# Patient Record
Sex: Female | Born: 1940 | Race: White | Hispanic: No | Smoking: Never smoker
Health system: Southern US, Community
[De-identification: ages and names within clinical notes are randomized; demographics above are authoritative.]

## PROBLEM LIST (undated history)

## (undated) DIAGNOSIS — Z972 Presence of dental prosthetic device (complete) (partial): Secondary | ICD-10-CM

## (undated) DIAGNOSIS — M1990 Unspecified osteoarthritis, unspecified site: Secondary | ICD-10-CM

## (undated) DIAGNOSIS — R232 Flushing: Secondary | ICD-10-CM

## (undated) DIAGNOSIS — I10 Essential (primary) hypertension: Secondary | ICD-10-CM

## (undated) DIAGNOSIS — G629 Polyneuropathy, unspecified: Secondary | ICD-10-CM

## (undated) DIAGNOSIS — E785 Hyperlipidemia, unspecified: Secondary | ICD-10-CM

## (undated) DIAGNOSIS — D649 Anemia, unspecified: Secondary | ICD-10-CM

## (undated) DIAGNOSIS — Z973 Presence of spectacles and contact lenses: Secondary | ICD-10-CM

## (undated) DIAGNOSIS — T4145XA Adverse effect of unspecified anesthetic, initial encounter: Secondary | ICD-10-CM

## (undated) DIAGNOSIS — Z923 Personal history of irradiation: Secondary | ICD-10-CM

## (undated) DIAGNOSIS — F329 Major depressive disorder, single episode, unspecified: Secondary | ICD-10-CM

## (undated) DIAGNOSIS — G4733 Obstructive sleep apnea (adult) (pediatric): Secondary | ICD-10-CM

## (undated) DIAGNOSIS — C50919 Malignant neoplasm of unspecified site of unspecified female breast: Secondary | ICD-10-CM

## (undated) DIAGNOSIS — T8859XA Other complications of anesthesia, initial encounter: Secondary | ICD-10-CM

## (undated) DIAGNOSIS — E119 Type 2 diabetes mellitus without complications: Secondary | ICD-10-CM

## (undated) DIAGNOSIS — F419 Anxiety disorder, unspecified: Secondary | ICD-10-CM

## (undated) DIAGNOSIS — N200 Calculus of kidney: Secondary | ICD-10-CM

## (undated) DIAGNOSIS — F409 Phobic anxiety disorder, unspecified: Secondary | ICD-10-CM

## (undated) DIAGNOSIS — K760 Fatty (change of) liver, not elsewhere classified: Secondary | ICD-10-CM

## (undated) DIAGNOSIS — M722 Plantar fascial fibromatosis: Secondary | ICD-10-CM

## (undated) DIAGNOSIS — IMO0002 Reserved for concepts with insufficient information to code with codable children: Secondary | ICD-10-CM

## (undated) DIAGNOSIS — E669 Obesity, unspecified: Secondary | ICD-10-CM

## (undated) DIAGNOSIS — F32A Depression, unspecified: Secondary | ICD-10-CM

## (undated) DIAGNOSIS — Z87442 Personal history of urinary calculi: Secondary | ICD-10-CM

## (undated) DIAGNOSIS — K219 Gastro-esophageal reflux disease without esophagitis: Secondary | ICD-10-CM

## (undated) HISTORY — DX: Type 2 diabetes mellitus without complications: E11.9

## (undated) HISTORY — DX: Essential (primary) hypertension: I10

## (undated) HISTORY — DX: Unspecified osteoarthritis, unspecified site: M19.90

## (undated) HISTORY — DX: Calculus of kidney: N20.0

## (undated) HISTORY — DX: Gastro-esophageal reflux disease without esophagitis: K21.9

## (undated) HISTORY — DX: Major depressive disorder, single episode, unspecified: F32.9

## (undated) HISTORY — PX: OOPHORECTOMY: SHX6387

## (undated) HISTORY — DX: Fatty (change of) liver, not elsewhere classified: K76.0

## (undated) HISTORY — DX: Plantar fascial fibromatosis: M72.2

## (undated) HISTORY — DX: Depression, unspecified: F32.A

## (undated) HISTORY — DX: Polyneuropathy, unspecified: G62.9

## (undated) HISTORY — DX: Anxiety disorder, unspecified: F41.9

## (undated) HISTORY — DX: Phobic anxiety disorder, unspecified: F40.9

## (undated) HISTORY — DX: Obstructive sleep apnea (adult) (pediatric): G47.33

## (undated) HISTORY — DX: Reserved for concepts with insufficient information to code with codable children: IMO0002

## (undated) HISTORY — PX: CYSTOSCOPY W/ URETERAL STENT PLACEMENT: SHX1429

## (undated) HISTORY — PX: KNEE SURGERY: SHX244

## (undated) HISTORY — PX: DILATION AND CURETTAGE OF UTERUS: SHX78

## (undated) HISTORY — DX: Obesity, unspecified: E66.9

## (undated) HISTORY — DX: Hyperlipidemia, unspecified: E78.5

## (undated) HISTORY — DX: Flushing: R23.2

---

## 1983-09-28 HISTORY — PX: BREAST LUMPECTOMY: SHX2

## 1986-09-27 HISTORY — PX: VAGINAL HYSTERECTOMY: SUR661

## 1999-10-08 ENCOUNTER — Other Ambulatory Visit: Admission: RE | Admit: 1999-10-08 | Discharge: 1999-10-08 | Payer: Self-pay | Admitting: Obstetrics & Gynecology

## 2000-11-01 ENCOUNTER — Other Ambulatory Visit: Admission: RE | Admit: 2000-11-01 | Discharge: 2000-11-01 | Payer: Self-pay | Admitting: Obstetrics & Gynecology

## 2001-12-04 ENCOUNTER — Other Ambulatory Visit: Admission: RE | Admit: 2001-12-04 | Discharge: 2001-12-04 | Payer: Self-pay | Admitting: Obstetrics & Gynecology

## 2003-01-14 ENCOUNTER — Other Ambulatory Visit: Admission: RE | Admit: 2003-01-14 | Discharge: 2003-01-14 | Payer: Self-pay | Admitting: Obstetrics & Gynecology

## 2004-01-08 ENCOUNTER — Other Ambulatory Visit: Admission: RE | Admit: 2004-01-08 | Discharge: 2004-01-08 | Payer: Self-pay | Admitting: Obstetrics & Gynecology

## 2005-01-26 ENCOUNTER — Other Ambulatory Visit: Admission: RE | Admit: 2005-01-26 | Discharge: 2005-01-26 | Payer: Self-pay | Admitting: Obstetrics & Gynecology

## 2009-05-15 ENCOUNTER — Encounter: Admission: RE | Admit: 2009-05-15 | Discharge: 2009-05-15 | Payer: Self-pay | Admitting: Family Medicine

## 2010-05-27 ENCOUNTER — Encounter: Admission: RE | Admit: 2010-05-27 | Discharge: 2010-05-27 | Payer: Self-pay | Admitting: Family Medicine

## 2010-09-23 ENCOUNTER — Ambulatory Visit (HOSPITAL_COMMUNITY): Admission: RE | Admit: 2010-09-23 | Payer: Self-pay | Source: Home / Self Care | Admitting: Orthopedic Surgery

## 2010-10-12 ENCOUNTER — Ambulatory Visit (HOSPITAL_COMMUNITY)
Admission: RE | Admit: 2010-10-12 | Discharge: 2010-10-12 | Payer: Self-pay | Source: Home / Self Care | Attending: Orthopedic Surgery | Admitting: Orthopedic Surgery

## 2010-10-18 ENCOUNTER — Encounter: Payer: Self-pay | Admitting: Orthopedic Surgery

## 2011-02-03 ENCOUNTER — Ambulatory Visit (INDEPENDENT_AMBULATORY_CARE_PROVIDER_SITE_OTHER): Payer: Medicare Other | Admitting: Internal Medicine

## 2011-02-03 ENCOUNTER — Encounter: Payer: Self-pay | Admitting: Internal Medicine

## 2011-02-03 VITALS — BP 132/76 | HR 88 | Ht 65.0 in | Wt 268.0 lb

## 2011-02-03 DIAGNOSIS — K921 Melena: Secondary | ICD-10-CM | POA: Insufficient documentation

## 2011-02-03 DIAGNOSIS — R1032 Left lower quadrant pain: Secondary | ICD-10-CM

## 2011-02-03 MED ORDER — PEG-KCL-NACL-NASULF-NA ASC-C 100 G PO SOLR: 1.0000 | Freq: Once | ORAL | Status: AC

## 2011-02-03 NOTE — Progress Notes (Signed)
Subjective:    Patient ID: Candice Reid, female    DOB: 28-Oct-1940, 70 y.o.   MRN: [id number]  HPI 70 year old widowed white woman with an approximately one-year history of intermittent hematochezia. She is passing red blood, usually bright into the commode he can also see with wiping. Her hemoglobin is normal she says. She's had some left lower quadrant pain and was prescribed some dicyclomine proximally 6 months ago and that seemed to help her. He said no other change in bowel habits or melena. She is actually currently feeling well. She had previous colonoscopy 2007 it was routine for screening she remembers it as being normal but we will request her records. She says she has a colonoscopy she does not want to feel anything and is concerned about the possibility of any type of cramps or pain. Dr. Donnetta Hail notes from 12/23/10 reviewed.  Past Medical History  Diagnosis Date  . Asthma   . Kidney stones   . Arthritis   . Hypertension   . Depression   . Hemorrhoids   . Hyperlipemia   . Anxiety                                                                                                                                                                                                                                                                  . GERD (gastroesophageal reflux disease)   . Obesity    Past Surgical History  Procedure Date  . Vaginal hysterectomy 1988  . Knee surgery     x 2  . Breast lumpectomy     reports that she has never smoked. She has never used smokeless tobacco. She reports that she does not drink alcohol or use illicit drugs. family history includes Colon polyps in her sister; Diabetes in her mother; Ovarian cancer in her sister; and Prostate cancer in her father.  There is no history of Colon cancer. Allergies  Allergen Reactions  . Ace Inhibitors   . Morphine And Related   . Sulfa Antibiotics   . Tylox        Review of Systems Also her anxiety, back  pain, depression, fatigue, night sweats, dyspnea, urinary incontinence at times. All other systems are negative or as per history of present  illness.    Objective:   Physical Exam Obese and well-developed white woman no acute distress The eyes anicteric pupils round and reactive to light The mouth and posterior pharynx are free of lesions The neck is supple no mass Lungs clear Heart distant S1-S2 no murmurs or gallops or rubs The abdomen is obese soft and nontender without obvious organomegaly, mass or hernia and bowel sounds are present Rectal exam with female staff present shows an old external tags brown stool no mass Lower extremities are free of edema Mood is appropriate Awake, alert and oriented x3       Assessment & Plan:

## 2011-02-03 NOTE — Assessment & Plan Note (Signed)
Investigated with colonoscopy in 2007 since last. Will use deep sedation with propofol, she is anxious she also has obesity which are some risk factors for poor outcome with moderate sedation. I explained there could be additional charges incurred. Risks benefits indications of colonoscopy were explained she understands and agrees to proceed.

## 2011-02-03 NOTE — Assessment & Plan Note (Signed)
Question from diverticulosis versus IBS or other problems colorectal neoplasia in the differential with bleeding and the pain await colonoscopy. She could resume using dicyclomine if needed, currently not taking that apparently.

## 2011-02-03 NOTE — Patient Instructions (Signed)
CcMolly Maduro Reade,MD   Colonoscopy A colonoscopy is an exam to evaluate your entire colon. In this exam, your colon is cleansed. A long fiberoptic tube is inserted through your rectum and into your colon. The fiberoptic scope (endoscope) is a long bundle of enclosed and very flexible fibers. These fibers transmit light to the area examined and send images from that area to your caregiver. Discomfort is usually minimal. You may be given a drug to help you sleep (sedative) during or prior to the procedure. This exam helps to detect lumps (tumors), polyps, inflammation, and areas of bleeding. Your caregiver may also take a small piece of tissue (biopsy) that will be examined under a microscope. BEFORE THE PROCEDURE  A clear liquid diet may be required for 2 days before the exam.   Liquid injections (enemas) or laxatives may be required.   A large amount of electrolyte solution may be given to you to drink over a short period of time. This solution is used to clean out your colon.   You should be present 1 prior to your procedure or as directed by your caregiver.   Check in at the admissions desk to fill out necessary forms if not preregistered. There will be consent forms to sign prior to the procedure. If accompanied by friends or family, there is a waiting area for them while you are having your procedure.  LET YOUR CAREGIVER KNOW ABOUT:  Allergies to food or medicine.  Medicines taken, including vitamins, herbs, eyedrops, over-the-counter medicines, and creams.   Use of steroids (by mouth or creams).   Previous problems with anesthetics or numbing medicines.   History of bleeding problems or blood clots.  Previous surgery.   Other health problems, including diabetes and kidney problems.   Possibility of pregnancy, if this applies.   AFTER THE PROCEDURE  If you received a sedative and/or pain medicine, you will need to arrange for someone to drive you home.   Occasionally, there is  a little blood passed with the first bowel movement. DO NOT be concerned.  HOME CARE INSTRUCTIONS  It is not unusual to pass moderate amounts of gas and experience mild abdominal cramping following the procedure. This is due to air being used to inflate your colon during the exam. Walking or a warm pack on your belly (abdomen) may help.   You may resume all normal meals and activities after sedatives and medicines have worn off.   Only take over-the-counter or prescription medicines for pain, discomfort, or fever as directed by your caregiver. DO NOT use aspirin or blood thinners if a biopsy was taken. Consult your caregiver for medicine usage if biopsies were taken.  FINDING OUT THE RESULTS OF YOUR TEST Not all test results are available during your visit. If your test results are not back during the visit, make an appointment with your caregiver to find out the results. Do not assume everything is normal if you have not heard from your caregiver or the medical facility. It is important for you to follow up on all of your test results. SEEK IMMEDIATE MEDICAL CARE IF:  You pass large blood clots or fill a toilet with blood following the procedure. This may also occur 10 to 14 days following the procedure. This is more likely if a biopsy was taken.   You develop abdominal pain that keeps getting worse and cannot be relieved with medicine.  Document Released: 09/10/2000 Document Re-Released: 12/08/2009 [facility] Patient Information 2011 [name], Candice Reid. Your Colonoscopy  with Propoful is scheduled on 03/11/2011 at 2:30pm You can pick up your MoviPrep kit from your pharmacy today

## 2011-03-11 ENCOUNTER — Ambulatory Visit (AMBULATORY_SURGERY_CENTER): Payer: Medicare Other | Admitting: Internal Medicine

## 2011-03-11 ENCOUNTER — Encounter: Payer: Self-pay | Admitting: Internal Medicine

## 2011-03-11 VITALS — BP 113/61 | HR 95 | Temp 98.6°F | Resp 20 | Ht 65.0 in | Wt 268.0 lb

## 2011-03-11 DIAGNOSIS — Z9283 Personal history of failed moderate sedation: Secondary | ICD-10-CM | POA: Insufficient documentation

## 2011-03-11 DIAGNOSIS — K648 Other hemorrhoids: Secondary | ICD-10-CM

## 2011-03-11 DIAGNOSIS — K921 Melena: Secondary | ICD-10-CM

## 2011-03-11 DIAGNOSIS — K5730 Diverticulosis of large intestine without perforation or abscess without bleeding: Secondary | ICD-10-CM

## 2011-03-11 DIAGNOSIS — K625 Hemorrhage of anus and rectum: Secondary | ICD-10-CM

## 2011-03-11 HISTORY — PX: COLONOSCOPY: SHX174

## 2011-03-11 MED ORDER — SODIUM CHLORIDE 0.9 % IV SOLN: 500.0000 mL | INTRAVENOUS | Status: DC

## 2011-03-11 NOTE — Patient Instructions (Signed)
Please follow the green and blue discharge instructions today.  See the [employer] picture page for colonoscopy results.  Call if any questions or concerns.

## 2011-03-11 NOTE — Progress Notes (Signed)
Pt c/o of cramping on admission.  I encouraged pt to pass flatus.  MAW  Pt still had not pass flatus.  Pt rolled to the right side.  Good amount of air expelled. MAW  No complaints on discharge.  MAW

## 2011-03-12 ENCOUNTER — Telehealth: Payer: Self-pay | Admitting: *Deleted

## 2011-03-12 NOTE — Telephone Encounter (Signed)

## 2012-08-30 ENCOUNTER — Encounter (INDEPENDENT_AMBULATORY_CARE_PROVIDER_SITE_OTHER): Payer: Medicare Other | Admitting: Ophthalmology

## 2012-08-30 DIAGNOSIS — H35039 Hypertensive retinopathy, unspecified eye: Secondary | ICD-10-CM

## 2012-08-30 DIAGNOSIS — I10 Essential (primary) hypertension: Secondary | ICD-10-CM

## 2012-08-30 DIAGNOSIS — H43819 Vitreous degeneration, unspecified eye: Secondary | ICD-10-CM

## 2012-08-30 DIAGNOSIS — H2510 Age-related nuclear cataract, unspecified eye: Secondary | ICD-10-CM

## 2013-01-18 ENCOUNTER — Emergency Department (HOSPITAL_BASED_OUTPATIENT_CLINIC_OR_DEPARTMENT_OTHER)
Admission: EM | Admit: 2013-01-18 | Discharge: 2013-01-18 | Disposition: A | Discharge status: Home / Self Care | Payer: Medicare Other | Attending: Emergency Medicine | Admitting: Emergency Medicine

## 2013-01-18 ENCOUNTER — Other Ambulatory Visit: Payer: Self-pay

## 2013-01-18 DIAGNOSIS — J45909 Unspecified asthma, uncomplicated: Secondary | ICD-10-CM | POA: Insufficient documentation

## 2013-01-18 DIAGNOSIS — Z8719 Personal history of other diseases of the digestive system: Secondary | ICD-10-CM | POA: Insufficient documentation

## 2013-01-18 DIAGNOSIS — F411 Generalized anxiety disorder: Secondary | ICD-10-CM | POA: Insufficient documentation

## 2013-01-18 DIAGNOSIS — I10 Essential (primary) hypertension: Secondary | ICD-10-CM | POA: Insufficient documentation

## 2013-01-18 DIAGNOSIS — Z8739 Personal history of other diseases of the musculoskeletal system and connective tissue: Secondary | ICD-10-CM | POA: Insufficient documentation

## 2013-01-18 DIAGNOSIS — E785 Hyperlipidemia, unspecified: Secondary | ICD-10-CM | POA: Insufficient documentation

## 2013-01-18 DIAGNOSIS — Z87442 Personal history of urinary calculi: Secondary | ICD-10-CM | POA: Insufficient documentation

## 2013-01-18 DIAGNOSIS — K219 Gastro-esophageal reflux disease without esophagitis: Secondary | ICD-10-CM | POA: Insufficient documentation

## 2013-01-18 DIAGNOSIS — E669 Obesity, unspecified: Secondary | ICD-10-CM | POA: Insufficient documentation

## 2013-01-18 DIAGNOSIS — Z79899 Other long term (current) drug therapy: Secondary | ICD-10-CM | POA: Insufficient documentation

## 2013-01-18 LAB — CBC WITH DIFFERENTIAL/PLATELET
Basophils Relative: 0 % (ref 0–1)
Eosinophils Absolute: 0.4 10*3/uL (ref 0.0–0.7)
HCT: 38.9 % (ref 36.0–46.0)
Hemoglobin: 13 g/dL (ref 12.0–15.0)
MCH: 30.2 pg (ref 26.0–34.0)
MCHC: 33.4 g/dL (ref 30.0–36.0)
MCV: 90.5 fL (ref 78.0–100.0)
Monocytes Absolute: 0.9 10*3/uL (ref 0.1–1.0)
Monocytes Relative: 8 % (ref 3–12)
Neutro Abs: 5.7 10*3/uL (ref 1.7–7.7)

## 2013-01-18 LAB — BASIC METABOLIC PANEL
CO2: 31 mEq/L (ref 19–32)
Chloride: 101 mEq/L (ref 96–112)
Creatinine, Ser: 0.9 mg/dL (ref 0.50–1.10)
GFR calc Af Amer: 73 mL/min — ABNORMAL LOW (ref >90)
Potassium: 4 mEq/L (ref 3.5–5.1)
Sodium: 141 mEq/L (ref 135–145)

## 2013-01-18 LAB — TROPONIN I: Troponin I: <0.3 ng/mL (ref <0.30)

## 2013-01-18 NOTE — ED Provider Notes (Signed)
History     CSN: [id number]  Arrival date & time 01/18/13  1478   First MD Initiated Contact with Patient 01/18/13 1955      Chief Complaint  Patient presents with  . Hypertension    (Consider location/radiation/quality/duration/timing/severity/associated sxs/prior treatment) HPI Comments: Patient presents with complaints of elevated blood pressure at home.  She recently had her medications changed for losartan to valsartan due to insurance reasons.  Her bp seems to be fluctuating at home.  She took it and was found to be 200/110 at home prior to coming here.  She otherwise feels okay.  Her only complaints are of feeling somewhat dizzy-headed.  No chest pain or shortness of breath.  No headaches.    Patient is a 72 y.o. female presenting with hypertension. The history is provided by the patient.  Hypertension This is a new problem. Episode onset: several days ago. Episode frequency: intermittently. The problem has been gradually worsening. Pertinent negatives include no chest pain, no abdominal pain, no headaches and no shortness of breath. Nothing aggravates the symptoms. Nothing relieves the symptoms.    Past Medical History  Diagnosis Date  . Asthma   . Kidney stones   . Arthritis   . Hypertension   . Depression   . Hemorrhoids   . Hyperlipemia   . Anxiety                                                                                                                                                                                                                                                                  . GERD (gastroesophageal reflux disease)   . Obesity     Past Surgical History  Procedure Laterality Date  . Vaginal hysterectomy  1988  . Knee surgery      x 2  . Breast lumpectomy    . Colonoscopy  03/11/2011    diverticulosis, internal hemorrhoids    Family History  Problem Relation Age of Onset  . Diabetes Mother   . Colon cancer Neg Hx   . Colon polyps  Sister   . Ovarian cancer Sister   . Prostate cancer Father     History  Substance Use Topics  . Smoking status: Never Smoker   . Smokeless tobacco:  Never Used  . Alcohol Use: No    OB History   Grav Para Term Preterm Abortions TAB SAB Ect Mult Living                  Review of Systems  Respiratory: Negative for shortness of breath.   Cardiovascular: Negative for chest pain.  Gastrointestinal: Negative for abdominal pain.  Neurological: Negative for headaches.  All other systems reviewed and are negative.    Allergies  Ace inhibitors; Morphine and related; Oxycodone-acetaminophen; and Sulfa antibiotics  Home Medications   Current Outpatient Rx  Name  Route  Sig  Dispense  Refill  . DULoxetine (CYMBALTA) 60 MG capsule   Oral   Take 60 mg by mouth daily.         . pravastatin (PRAVACHOL) 10 MG tablet   Oral   Take 10 mg by mouth daily.         . valsartan-hydrochlorothiazide (DIOVAN-HCT) 320-25 MG per tablet   Oral   Take 1 tablet by mouth daily.         [name] Kitchen ALPRAZolam (XANAX) 0.5 MG tablet   Oral   Take 0.5 mg by mouth. 1/2 as needed          . metoprolol (TOPROL-XL) 100 MG 24 hr tablet   Oral   Take 100 mg by mouth daily.           . naproxen sodium (ALEVE) 220 MG tablet   Oral   Take 220 mg by mouth. As needed          . nystatin-triamcinolone (MYCOLOG II) cream               . OMEGA-3 1000 MG CAPS   Oral   Take 1 capsule by mouth daily.           [name] Kitchen omeprazole (PRILOSEC OTC) 20 MG tablet   Oral   Take 20 mg by mouth daily.           . rosuvastatin (CRESTOR) 5 MG tablet   Oral   Take 5 mg by mouth daily.           . sertraline (ZOLOFT) 100 MG tablet   Oral   Take 150 mg by mouth daily.           [name] Kitchen telmisartan-hydrochlorothiazide (MICARDIS HCT) 80-12.5 MG per tablet   Oral   Take 1 tablet by mouth daily.           [name] Kitchen VITAMIN D, CHOLECALCIFEROL, PO   Oral   Take 1 tablet by mouth daily.             BP 143/82   Pulse 74  Temp(Src) 97.5 F (36.4 C)  Resp 14  Ht 5\' 4"  (1.626 m)  Wt 237 lb (107.502 kg)  BMI 40.66 kg/m2  SpO2 97%  Physical Exam  Nursing note and vitals reviewed. Constitutional: She is oriented to person, place, and time. She appears well-developed and well-nourished. No distress.  HENT:  Head: Normocephalic and atraumatic.  Mouth/Throat: Oropharynx is clear and moist.  Eyes: EOM are normal. Pupils are equal, round, and reactive to light.  Neck: Normal range of motion. Neck supple.  Cardiovascular: Normal rate, regular rhythm and normal heart sounds.  Exam reveals no gallop and no friction rub.   No murmur heard. Pulmonary/Chest: Effort normal and breath sounds normal. No respiratory distress. She has no wheezes.  Abdominal: Soft. Bowel sounds are normal. She exhibits no distension. There is  no tenderness.  Musculoskeletal: Normal range of motion.  Neurological: She is alert and oriented to person, place, and time. No cranial nerve deficit. She exhibits normal muscle tone. Coordination normal.  Skin: Skin is warm and dry. She is not diaphoretic.    ED Course  Procedures (including critical care time)  Labs Reviewed  CBC WITH DIFFERENTIAL  BASIC METABOLIC PANEL   No results found.   No diagnosis found.    MDM   Date: 01/19/2013  Rate: 65  Rhythm: normal sinus rhythm  QRS Axis: normal  Intervals: normal  ST/T Wave abnormalities: normal  Conduction Disutrbances:none  Narrative Interpretation:   Old EKG Reviewed: unchanged   The patient presents here with complaints of elevated blood pressure readings on her home monitor.  She recently had some changes made in her medications.  She denies any specific complaints and otherwise appears well.  She is neurologically intact and ekg and labs are all unremarkable.  She reports a bp of 200/110 at home just prior to coming here.  Our readings in the ED have been much lower,  She was monitored for two hours and bp was  checked at regular intervals and readings remained very acceptable.  I suspect her home machine readings are off.  She will be discharged to home with instructions to continue her medications and return prn if she worsens.          Geoffery Lyons, MD 01/19/13 [phone]

## 2013-01-18 NOTE — ED Notes (Signed)
MD at bedside. 

## 2013-01-18 NOTE — ED Notes (Signed)
Pt. Reports HTN for long time and was changed to a new HTN med due to trouble with HTN meds for 2 wks.   Pt. Placed on Duloxentine in place of Losartin.

## 2013-09-27 DIAGNOSIS — C50919 Malignant neoplasm of unspecified site of unspecified female breast: Secondary | ICD-10-CM

## 2013-09-27 DIAGNOSIS — Z923 Personal history of irradiation: Secondary | ICD-10-CM

## 2013-09-27 HISTORY — DX: Personal history of irradiation: Z92.3

## 2013-09-27 HISTORY — DX: Malignant neoplasm of unspecified site of unspecified female breast: C50.919

## 2014-01-29 ENCOUNTER — Other Ambulatory Visit: Payer: Self-pay | Admitting: Obstetrics & Gynecology

## 2014-01-29 DIAGNOSIS — R928 Other abnormal and inconclusive findings on diagnostic imaging of breast: Secondary | ICD-10-CM

## 2014-02-08 ENCOUNTER — Ambulatory Visit
Admission: RE | Admit: 2014-02-08 | Discharge: 2014-02-08 | Disposition: A | Discharge status: Home / Self Care | Payer: Medicare Other | Source: Ambulatory Visit | Attending: Obstetrics & Gynecology | Admitting: Obstetrics & Gynecology

## 2014-02-08 ENCOUNTER — Other Ambulatory Visit: Payer: Self-pay | Admitting: Obstetrics & Gynecology

## 2014-02-08 ENCOUNTER — Encounter (INDEPENDENT_AMBULATORY_CARE_PROVIDER_SITE_OTHER): Payer: Self-pay

## 2014-02-08 DIAGNOSIS — R928 Other abnormal and inconclusive findings on diagnostic imaging of breast: Secondary | ICD-10-CM

## 2014-02-14 ENCOUNTER — Other Ambulatory Visit: Payer: Self-pay | Admitting: Obstetrics & Gynecology

## 2014-02-14 ENCOUNTER — Ambulatory Visit
Admission: RE | Admit: 2014-02-14 | Discharge: 2014-02-14 | Disposition: A | Discharge status: Home / Self Care | Payer: Medicare Other | Source: Ambulatory Visit | Attending: Obstetrics & Gynecology | Admitting: Obstetrics & Gynecology

## 2014-02-14 DIAGNOSIS — R928 Other abnormal and inconclusive findings on diagnostic imaging of breast: Secondary | ICD-10-CM

## 2014-02-15 ENCOUNTER — Other Ambulatory Visit: Payer: Self-pay | Admitting: Obstetrics & Gynecology

## 2014-02-15 ENCOUNTER — Telehealth: Payer: Self-pay | Admitting: *Deleted

## 2014-02-15 DIAGNOSIS — Z170 Estrogen receptor positive status [ER+]: Secondary | ICD-10-CM

## 2014-02-15 DIAGNOSIS — C50919 Malignant neoplasm of unspecified site of unspecified female breast: Secondary | ICD-10-CM

## 2014-02-15 DIAGNOSIS — C50312 Malignant neoplasm of lower-inner quadrant of left female breast: Secondary | ICD-10-CM

## 2014-02-15 NOTE — Telephone Encounter (Signed)
Confirmed [facility] for 02/20/14 at 0800.  Instructions and contact information given.

## 2014-02-20 ENCOUNTER — Ambulatory Visit (HOSPITAL_BASED_OUTPATIENT_CLINIC_OR_DEPARTMENT_OTHER): Payer: Medicare Other | Admitting: Oncology

## 2014-02-20 ENCOUNTER — Ambulatory Visit
Admission: RE | Admit: 2014-02-20 | Discharge: 2014-02-20 | Disposition: A | Discharge status: Home / Self Care | Payer: Medicare Other | Source: Ambulatory Visit | Attending: Radiation Oncology | Admitting: Radiation Oncology

## 2014-02-20 ENCOUNTER — Ambulatory Visit (HOSPITAL_BASED_OUTPATIENT_CLINIC_OR_DEPARTMENT_OTHER): Payer: Medicare Other

## 2014-02-20 ENCOUNTER — Encounter: Payer: Self-pay | Admitting: Oncology

## 2014-02-20 ENCOUNTER — Encounter: Payer: Self-pay | Admitting: *Deleted

## 2014-02-20 ENCOUNTER — Ambulatory Visit (HOSPITAL_BASED_OUTPATIENT_CLINIC_OR_DEPARTMENT_OTHER): Payer: Medicare Other | Admitting: Surgery

## 2014-02-20 ENCOUNTER — Other Ambulatory Visit (INDEPENDENT_AMBULATORY_CARE_PROVIDER_SITE_OTHER): Payer: Self-pay | Admitting: Surgery

## 2014-02-20 ENCOUNTER — Other Ambulatory Visit (HOSPITAL_BASED_OUTPATIENT_CLINIC_OR_DEPARTMENT_OTHER): Payer: Medicare Other

## 2014-02-20 VITALS — BP 137/83 | HR 84 | Temp 98.5°F | Resp 20 | Ht 63.5 in | Wt 259.9 lb

## 2014-02-20 DIAGNOSIS — C50319 Malignant neoplasm of lower-inner quadrant of unspecified female breast: Secondary | ICD-10-CM

## 2014-02-20 DIAGNOSIS — C50312 Malignant neoplasm of lower-inner quadrant of left female breast: Secondary | ICD-10-CM

## 2014-02-20 DIAGNOSIS — C50212 Malignant neoplasm of upper-inner quadrant of left female breast: Secondary | ICD-10-CM

## 2014-02-20 LAB — CBC WITH DIFFERENTIAL/PLATELET
BASO%: 0.5 % (ref 0.0–2.0)
Basophils Absolute: 0 10*3/uL (ref 0.0–0.1)
EOS%: 2.4 % (ref 0.0–7.0)
Eosinophils Absolute: 0.2 10*3/uL (ref 0.0–0.5)
HCT: 39.6 % (ref 34.8–46.6)
HGB: 13.2 g/dL (ref 11.6–15.9)
LYMPH%: 36.5 % (ref 14.0–49.7)
MCH: 30.2 pg (ref 25.1–34.0)
MCHC: 33.3 g/dL (ref 31.5–36.0)
MCV: 90.4 fL (ref 79.5–101.0)
MONO#: 0.8 10*3/uL (ref 0.1–0.9)
MONO%: 8.1 % (ref 0.0–14.0)
NEUT#: 4.9 10*3/uL (ref 1.5–6.5)
NEUT%: 52.5 % (ref 38.4–76.8)
PLATELETS: 276 10*3/uL (ref 145–400)
RBC: 4.38 10*6/uL (ref 3.70–5.45)
RDW: 13.3 % (ref 11.2–14.5)
WBC: 9.4 10*3/uL (ref 3.9–10.3)
lymph#: 3.4 10*3/uL — ABNORMAL HIGH (ref 0.9–3.3)

## 2014-02-20 LAB — COMPREHENSIVE METABOLIC PANEL (CC13)
ALK PHOS: 75 U/L (ref 40–150)
ALT: 33 U/L (ref 0–55)
AST: 28 U/L (ref 5–34)
Albumin: 3.5 g/dL (ref 3.5–5.0)
Anion Gap: 12 mEq/L — ABNORMAL HIGH (ref 3–11)
BILIRUBIN TOTAL: 0.39 mg/dL (ref 0.20–1.20)
BUN: 21.4 mg/dL (ref 7.0–26.0)
CO2: 28 mEq/L (ref 22–29)
Calcium: 9.5 mg/dL (ref 8.4–10.4)
Chloride: 104 mEq/L (ref 98–109)
Creatinine: 0.9 mg/dL (ref 0.6–1.1)
GLUCOSE: 105 mg/dL (ref 70–140)
Potassium: 3.9 mEq/L (ref 3.5–5.1)
SODIUM: 144 meq/L (ref 136–145)
TOTAL PROTEIN: 7.2 g/dL (ref 6.4–8.3)

## 2014-02-20 NOTE — Progress Notes (Signed)
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   HISTORY OF PRESENT ILLNESS::Candice Reid is a 73 y.o. female  given a screening mammogram. A 1 cm mass was noted in the left breast. Ultrasound confirmed a 0.8 x 1.1 x 1.1 cm mass. She had a biopsy which showed grade 2 invasive ductal carcinoma with associated DCIS. This was ER/PR positive HER-2 is pending. Ki-67 was 10%. She presents to multidisciplinary clinic today. She has some soreness after biopsy. She had no palpable areas of concern or pain prior to her biopsy. No history of breast cancer. She presents with her daughter today. She had menses at 13. She used hormone replacement for about 5 years but quit 20 years ago. She cannot remember when she went through menopause but feels like it was 20 or 30 years ago. She is GX P2 with her first live birth 15.  PREVIOUS RADIATION THERAPY: No  PAST MEDICAL HISTORY:  has a past medical history of Asthma; Kidney stones; Arthritis; Hypertension; Depression; Hemorrhoids; Hyperlipemia; Anxiety (                                                                                                                                                                                                                                                               ); GERD (gastroesophageal reflux disease); Obesity; Hot flashes; and Phobia.  PAST SURGICAL HISTORY: Past Surgical History  Procedure Laterality Date  . Vaginal  hysterectomy  1988  . Knee surgery      x 2  . Breast lumpectomy    . Colonoscopy  03/11/2011    diverticulosis, internal hemorrhoids    FAMILY HISTORY:  Family History  Problem Relation Age of Onset  . Diabetes Mother   . Colon cancer  Neg Hx   . Colon polyps Sister   . Lung cancer Sister   . Ovarian cancer Sister   . Brain cancer Sister   . Prostate cancer Father   . Brain cancer Brother     SOCIAL HISTORY:  History  Substance Use Topics  . Smoking status: Never Smoker   . Smokeless tobacco: Never Used  . Alcohol Use: No    ALLERGIES: Ace inhibitors; Morphine and related; Oxycodone-acetaminophen; and Sulfa antibiotics  MEDICATIONS:  Current Outpatient Prescriptions  Medication Sig Dispense Refill  . ALPRAZolam (XANAX) 0.5 MG tablet Take 0.5 mg by mouth. 1/2 as needed       . DULoxetine (CYMBALTA) 60 MG capsule Take 60 mg by mouth daily.      . metoprolol (TOPROL-XL) 100 MG 24 hr tablet Take 100 mg by mouth daily.        . naproxen sodium (ALEVE) 220 MG tablet Take 220 mg by mouth. As needed       . nystatin-triamcinolone (MYCOLOG II) cream       . OMEGA-3 1000 MG CAPS Take 1 capsule by mouth daily.        [name] Kitchen omeprazole (PRILOSEC OTC) 20 MG tablet Take 20 mg by mouth daily.        . pravastatin (PRAVACHOL) 10 MG tablet Take 10 mg by mouth daily.      . rosuvastatin (CRESTOR) 5 MG tablet Take 5 mg by mouth daily.        . sertraline (ZOLOFT) 100 MG tablet Take 150 mg by mouth daily.        [name] Kitchen telmisartan-hydrochlorothiazide (MICARDIS HCT) 80-12.5 MG per tablet Take 1 tablet by mouth daily.        . valsartan-hydrochlorothiazide (DIOVAN-HCT) 320-25 MG per tablet Take 1 tablet by mouth daily.      [name] Kitchen VITAMIN D, CHOLECALCIFEROL, PO Take 1 tablet by mouth daily.         Current Facility-Administered Medications  Medication Dose Route Frequency Provider Last Rate Last Dose  . 0.9 %  sodium chloride infusion  500 mL Intravenous Continuous Gatha Mayer, MD        REVIEW OF  SYSTEMS:  A 15 point review of systems is documented in the electronic medical record. This was obtained by the nursing staff. However, I reviewed this with the patient to discuss relevant findings and make appropriate changes.  Pertinent items are noted in HPI.  PHYSICAL EXAM: There were no vitals filed for this visit.. . Pleasant female who appears younger than her stated age sitting comfortably in the examining room table. She has large pendulous breasts bilaterally. No palpable axillary cervical or subclavicular lymph nodes. No palpable abnormalities of the right breast. She has some bruising in the lower inner quadrant of the left breast associated with a palpable area postbiopsy change. She is alert and oriented x3. A 5 out of 5 strength in her bilateral upper extremities.  LABORATORY DATA:  Lab Results  Component Value Date   WBC 9.4 02/20/2014   HGB 13.2 02/20/2014   HCT 39.6 02/20/2014   MCV 90.4 02/20/2014   PLT 276 02/20/2014   Lab Results  Component Value Date   NA 144 02/20/2014   K 3.9 02/20/2014   CL 101 01/18/2013   CO2 28 02/20/2014   Lab Results  Component Value Date   ALT 33 02/20/2014   AST 28 02/20/2014   ALKPHOS 75 02/20/2014   BILITOT 0.39 02/20/2014     RADIOGRAPHY: Mm  Digital Diagnostic Unilat L  02/15/2014   ADDENDUM REPORT: 02/15/2014 14:23  ADDENDUM: Pathology results: Results from the left breast biopsy at the 7 o'clock position revealed grade 2 IDC & DCIS. This is concordant with the imaging findings. The patient has been notified of the results. She is doing well and denies any biopsy site complications.  She has a followup appointment with Multidisciplinary Clinic 02/20/2014 at 8 a.m. and a breast MRI is scheduled 02/22/2014 at 9:30 a.m. The patient has been instructed to call the [facility] with any questions or concerns.   Electronically Signed   By: Everlean Alstrom M.D.   On: 02/15/2014 14:23   02/15/2014   CLINICAL DATA:  Status post ultrasound-guided core  needle biopsy left breast mass 7 o'clock position  EXAM: DIAGNOSTIC LEFT MAMMOGRAM POST ULTRASOUND BIOPSY  COMPARISON:  Previous exams  FINDINGS: Mammographic images were obtained following ultrasound guided biopsy of suspicious mass left breast 7 o'clock position. Ribbon shaped biopsy marking clip in appropriate position.  IMPRESSION: Appropriate position biopsy marking clip status post ultrasound-guided core needle biopsy suspicious left breast mass 7 o'clock position.  Final Assessment: Post Procedure Mammograms for Marker Placement  Electronically Signed: By: Lovey Newcomer M.D. On: 02/14/2014 09:37   Mm Digital Diagnostic Unilat L  02/08/2014   CLINICAL DATA:  The patient presents for additional views of the left breast as followup to a recent screening exam to evaluate a possible mass.  EXAM: DIGITAL DIAGNOSTIC  left MAMMOGRAM  ULTRASOUND left BREAST  COMPARISON:  Previous exams.  ACR Breast Density Category b: There are scattered areas of fibroglandular density.  FINDINGS: Spot compression and true lateral images demonstrate a 1.1 cm ovoid mass with spiculated borders over the inner lower breast.  Ultrasound is performed, showing a mass of irregular shape and borders at the 7 o'clock position of the left breast 7 cm from the nipple corresponding to the mammographic abnormality. This mass measures 0.8 x 1 x 1 cm. Ultrasound of the left axilla is unremarkable.  IMPRESSION: Hypoechoic mass of irregular shape and borders at the 7 o'clock position of the left breast 7 cm from the nipple measuring 0.8 x 1 x 1 cm.  RECOMMENDATION: Recommend ultrasound-guided core needle biopsy of this suspicious mass.  I have discussed the findings and recommendations with the patient. Results were also provided in writing at the conclusion of the visit. If applicable, a reminder letter will be sent to the patient regarding the next appointment.  BI-RADS CATEGORY  4: Suspicious.  Biopsy scheduled for 02/14/2014 at 8 a.m.    Electronically Signed   By: Marin Olp M.D.   On: 02/08/2014 13:37   US Breast Ltd Uni Left Inc Axilla  02/08/2014   CLINICAL DATA:  The patient presents for additional views of the left breast as followup to a recent screening exam to evaluate a possible mass.  EXAM: DIGITAL DIAGNOSTIC  left MAMMOGRAM  ULTRASOUND left BREAST  COMPARISON:  Previous exams.  ACR Breast Density Category b: There are scattered areas of fibroglandular density.  FINDINGS: Spot compression and true lateral images demonstrate a 1.1 cm ovoid mass with spiculated borders over the inner lower breast.  Ultrasound is performed, showing a mass of irregular shape and borders at the 7 o'clock position of the left breast 7 cm from the nipple corresponding to the mammographic abnormality. This mass measures 0.8 x 1 x 1 cm. Ultrasound of the left axilla is unremarkable.  IMPRESSION: Hypoechoic mass of irregular shape and  borders at the 7 o'clock position of the left breast 7 cm from the nipple measuring 0.8 x 1 x 1 cm.  RECOMMENDATION: Recommend ultrasound-guided core needle biopsy of this suspicious mass.  I have discussed the findings and recommendations with the patient. Results were also provided in writing at the conclusion of the visit. If applicable, a reminder letter will be sent to the patient regarding the next appointment.  BI-RADS CATEGORY  4: Suspicious.  Biopsy scheduled for 02/14/2014 at 8 a.m.   Electronically Signed   By: Marin Olp M.D.   On: 02/08/2014 13:37   [place] Lt Breast Bx W Loc Dev 1st Lesion Img Bx Spec US Guide  02/14/2014   CLINICAL DATA:  Patient with suspicious left breast mass.  EXAM: ULTRASOUND GUIDED LEFT BREAST CORE NEEDLE BIOPSY  COMPARISON:  Previous exams.  PROCEDURE: I met with the patient and we discussed the procedure of ultrasound-guided biopsy, including benefits and alternatives. We discussed the high likelihood of a successful procedure. We discussed the risks of the procedure including infection,  bleeding, tissue injury, clip migration, and inadequate sampling. Informed written consent was given. The usual time-out protocol was performed immediately prior to the procedure.  Using sterile technique and 2% Lidocaine as local anesthetic, under direct ultrasound visualization, a 12 gauge vacuum-assisteddevice was used to perform biopsy of a suspicious left breast mass 7 o'clock position using a medial approach. At the conclusion of the procedure, a ribbon shaped tissue marker clip was deployed into the biopsy cavity. Follow-up 2-view mammogram was performed and dictated separately.  IMPRESSION: Ultrasound-guided biopsy of suspicious left breast mass 7 o'clock position. No apparent complications.   Electronically Signed   By: Lovey Newcomer M.D.   On: 02/14/2014 09:35      IMPRESSION: T1 B. N0 invasive ductal carcinoma of the left breast  PLAN: I spoke to the patient today regarding her diagnosis and options for treatment. We discussed the equivalence in terms of survival and local failure between mastectomy and breast conservation. We discussed the role of radiation in decreasing local failures in patients who undergo lumpectomy. We discussed that she had the option of not having radiation and having antiestrogen therapy. We discussed the differences between systemic and local treatment. We discussed the process of simulation and the placement tattoos. We discussed 4-6 weeks of treatment as an outpatient. We discussed the possibility of asymptomatic lung damage. We discussed the low likelihood of secondary malignancies. We discussed the possible side effects including but not limited to skin redness, fatigue, permanent skin darkening, and breast swelling. We discussed the process of simulation and the placement of tattoos. She met with medical oncology as well as a member of our patient family support team and our physical therapist. At this point she is leaning towards antiestrogen therapy alone after  surgery. I will plan on seeing her back after her surgery if she is interested in radiation. I spent 40 minutes face to face with the patient and more than 50% of that time was spent in counseling and/or coordination of care.   I spent 60 minutes  face to face with the patient and more than 50% of that time was spent in counseling and/or coordination of care.   ------------------------------------------------  Thea Silversmith, MD

## 2014-02-20 NOTE — Progress Notes (Signed)
 Re:   Candice Reid DOB:   03/05/1941 MRN:   [id number]  Breast [facility]  ASSESSMENT AND PLAN: 1.  Left breast cancer (T1, N0) - 7 o'clock  Path from 02/14/2014 ([id number])- Grade 2 IDC, ER/PR pos, Her2Neu - neg, Ki67 - 10%  Oncology - Candice Reid/Candice Reid  I discussed the options for breast cancer treatment with the patient.  She is in the multidisciplinary breast clinic which includes medical oncology and radiation oncology.  I discussed the surgical options of lumpectomy vs. mastectomy.  If mastectomy, there is the possibility of reconstruction.   I discussed the options of lymph node biopsy.  The treatment plan depends on the pathologic staging of the tumor and the patient's personal wishes.  The risks of surgery include, but are not limited to, bleeding, infection, the need for further surgery, and nerve injury.  The patient has been given literature on the treatment of breast cancer.  Plan:  1) The patient is very claustrophobic, so we will cancel the MRI, 2) Needle loc (or seed) left lumpectomy and left axillary SLNBx (I discussed both seed and wire loc), 3) no oncotype.  [the patient mentioned breast reduction - look into that as timed with lumpectomy]  2.  Asthma - Seasonal 3.  History of nephrolithiasis - over 15 years ago 4.  HTN x 20 years 5.  Depression/anxiety 6.  Difficulty with legs and walking 7.  Morbid obesity  No chief complaint on file.  REFERRING PHYSICIAN:  Drew Davis, MD.  Radiology  HISTORY OF PRESENT ILLNESS: Candice Reid is a 73 y.o. (DOB: 01/27/1941)  white  female whose primary care physician is Candice ALEXANDER, MD and comes to [facility] today for new left breast cancer. Her daughter, Candice Reid, is with her.  She went for her routine mammogram and was found to have an abnormality in the left breast.  She has had no prior breast biopsy.  She took hormones for 20 years, but quit about 5 years ago.  She gives a history of taking a medicine to "dry up her  lactation" after her first child.  She later heard that that medicine may be associated with breast cancer.  She does not know the name of the med.  She has no fam hx of breast cancer.  Mammograms/US on 02/08/2014 showed a 1.0 x 1.0 x 0.8 cm mass at 7 o'clock. US guided biopsy ([facility]) on 02/14/2014 Path from 02/14/2014 ([id number])- Grade 2 IDC, ER/PR pos, Her2Neu - neg, Ki67 - 10%   Past Medical History  Diagnosis Date  . Asthma   . Kidney stones   . Arthritis   . Hypertension   . Depression   . Hemorrhoids   . Hyperlipemia   . Anxiety                                                                                                                                                                                                                                                                  .   GERD (gastroesophageal reflux disease)   . Obesity      Past Surgical History  Procedure Laterality Date  . Vaginal hysterectomy  1988  . Knee surgery      x 2  . Breast lumpectomy    . Colonoscopy  03/11/2011    diverticulosis, internal hemorrhoids     Current Outpatient Prescriptions  Medication Sig Dispense Refill  . ALPRAZolam (XANAX) 0.5 MG tablet Take 0.5 mg by mouth. 1/2 as needed       . DULoxetine (CYMBALTA) 60 MG capsule Take 60 mg by mouth daily.      . metoprolol (TOPROL-XL) 100 MG 24 hr tablet Take 100 mg by mouth daily.        . naproxen sodium (ALEVE) 220 MG tablet Take 220 mg by mouth. As needed       . nystatin-triamcinolone (MYCOLOG II) cream       . OMEGA-3 1000 MG CAPS Take 1 capsule by mouth daily.        . omeprazole (PRILOSEC OTC) 20 MG tablet Take 20 mg by mouth daily.        . pravastatin (PRAVACHOL) 10 MG tablet Take 10 mg by mouth daily.      . rosuvastatin (CRESTOR) 5 MG tablet Take 5 mg by mouth daily.        . sertraline (ZOLOFT) 100 MG tablet Take 150 mg by mouth daily.        . telmisartan-hydrochlorothiazide (MICARDIS HCT) 80-12.5 MG per tablet  Take 1 tablet by mouth daily.        . valsartan-hydrochlorothiazide (DIOVAN-HCT) 320-25 MG per tablet Take 1 tablet by mouth daily.      . VITAMIN D, CHOLECALCIFEROL, PO Take 1 tablet by mouth daily.         Current Facility-Administered Medications  Medication Dose Route Frequency Provider Last Rate Last Dose  . 0.9 %  sodium chloride infusion  500 mL Intravenous Continuous Candice E Gessner, MD          Allergies  Allergen Reactions  . Ace Inhibitors   . Morphine And Related   . Oxycodone-Acetaminophen   . Sulfa Antibiotics     REVIEW OF SYSTEMS: Skin:  No history of rash.  No history of abnormal moles. Infection:  No history of hepatitis or HIV.  No history of MRSA. Neurologic:  No history of stroke.  No history of seizure.  No history of headaches. Cardiac:  Hypertension. No history of heart disease.  No history of prior cardiac catheterization.  No history of seeing a cardiologist. Pulmonary:  Seasonal asthma.  Endocrine:  No diabetes. No thyroid disease. Gastrointestinal:  No history of stomach disease.  No history of liver disease.  No history of gall bladder disease.  No history of pancreas disease.  No history of colon disease. Urologic: History of nephrolithiasis GYN:  Sees Candice Reid.  Had hysterectomy about 1990. Musculoskeletal:  Left knee arthroscopy.  Can't straighten knees and uses cane to walk any distance.. Hematologic:  No bleeding disorder.  No history of anemia.  Not anticoagulated. Psycho-social:  The patient is oriented.   The patient has no obvious psychologic or social impairment to understanding our conversation and plan.  SOCIAL and FAMILY HISTORY: Widowed. Lives by self. Has daughter, Candice Reid, and son, Candice Reid.  PHYSICAL EXAM: There were no vitals taken for this visit.  General: WN obese WF who is alert..  HEENT: Normal. Pupils equal. Neck: Supple. No mass.  No thyroid mass. Lymph Nodes:    No supraclavicular or cervical nodes. Lungs: Clear to  auscultation and symmetric breath sounds. Heart:  RRR. No murmur or rub. Breast:  Right - Very large.  No mass.  Left - Very large.  Bruise at 7 o'clock.  Abdomen: Soft. No mass. No tenderness. No hernia. Normal bowel sounds.  Pfannenstiel scar. Rectal: Not done. Extremities:  Can't straighten lower extremities and some trouble walking. Neurologic:  Grossly intact to motor and sensory function. Psychiatric: Has normal mood and affect. Behavior is normal.   DATA REVIEWED: Epic notes.  Matas Burrows, MD,  FACS [facility] [address]  [address]   , [place]    [id number] Phone:  [phone] FAX:  [phone]  

## 2014-02-20 NOTE — Progress Notes (Signed)
[place]  [phone] Fax:([phone]) [phone]     ID: [name] OB: [birth year]  MR#: [id number]  CVE#:[id number]  PCP: Vena Austria, MD GYN:  Evette Cristal SU:  Alphonsa Overall OTHER MD: Thea Silversmith  CHIEF COMPLAINT: Early-stage breast cancer  BREAST CANCER HISTORY: That he had routine screening mammography may 2015 showing a possible mass in the left breast. On 02/08/2014 she had left diagnostic mammography and ultrasonography of the breast Center, showing breast density category B. Otherwise a 1.1 cm ovoid mass in the inner lower quadrant of the left breast. Ultrasound confirmed an irregularly shaped mass measuring 1.0 cm. Ultrasound of the left axilla was unremarkable.  Biopsy of the left breast mass in question 02/14/2014 showed ([id number]) and invasive ductal carcinoma, grade 2, estrogen and progesterone receptor positive, with an MIB-1 of 10%. HER-2 is pending.  The patient's subsequent history is as detailed below  INTERVAL HISTORY: Latoy was evaluated in the multidisciplinary breast cancer clinic 02/20/2014 accompanied by her daughter Angela Nevin. The patient's case was also discussed at conference that same morning  REVIEW OF SYSTEMS: There were no specific symptoms leading to the original mammogram, which was routinely scheduled. The patient denies unusual headaches, visual changes, nausea, vomiting, stiff neck, dizziness, or gait imbalance. There has been no cough, phlegm production, or pleurisy, no chest pain or pressure, and no change in bowel or bladder habits. The patient denies fever, rash, bleeding, unexplained fatigue or unexplained weight loss. The patient [facility] as her main form of exercise. She has a long-term history of palpitations which she attributes to caffeine. She gets short of breath when walking up stairs. She has occasional problems with heartburn. She has pains here in there, especially in her  back, but these are not more intense or persistent than usual. She admits to some anxiety and depression and she has significant claustrophobia, which makes her turgor 5 of having to have an MRI. She does admit to hot flashes which are moderate. A detailed review of systems was otherwise entirely negative.  PAST MEDICAL HISTORY: Past Medical History  Diagnosis Date  . Asthma   . Kidney stones   . Arthritis   . Hypertension   . Depression   . Hemorrhoids   . Hyperlipemia   . Anxiety                                                                                                                                                                                                                                                                  .  GERD (gastroesophageal reflux disease)   . Obesity   . Hot flashes   . Phobia     PAST SURGICAL HISTORY: Past Surgical History  Procedure Laterality Date  . Vaginal hysterectomy  1988  . Knee surgery      x 2  . Breast lumpectomy    . Colonoscopy  03/11/2011    diverticulosis, internal hemorrhoids    FAMILY HISTORY Family History  Problem Relation Age of Onset  . Diabetes Mother   . Colon cancer Neg Hx   . Colon polyps Sister   . Lung cancer Sister   . Ovarian cancer Sister   . Brain cancer Sister   . Prostate cancer Father   . Brain cancer Brother   The patient's father died from cancer, unknown type, at the age of 70. The patient's mother died from a stroke in the setting of diabetes at the age of 28. The patient had one brother, 3 sisters. One sister had lung cancer diagnosed at age 22. Another had brain cancer the age of 109. One brother also had brain cancer, at the age of 32. There is no history of breast or ovarian cancer in the family to the patient's knowledge  GYNECOLOGIC HISTORY:  Menarche age 66, first live birth age 67, the patient is [place] P2. She is status post remote hysterectomy with bilateral salpingo-oophorectomy. She took  hormone replacement approximately 20 years, stopping approximately 2010.   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 Her daughter [name] lives in [place] and works in Press photographer. The patient's son [name] "[name] " rate Gorr also lives in [place], where he is a Airline pilot. The patient has 3 grandchildren. She [employer]    ADVANCED DIRECTIVES: Not in place. These were discussed at the initial visit 02/20/2014. The patient tells me she has the appropriate documents in hand and intends to name her daughter [name] as healthcare power of attorney. [name] can be reached at [place]: History  Substance Use Topics  . Smoking status: Never Smoker   . Smokeless tobacco: Never Used  . Alcohol Use: No     Colonoscopy: 2013/ [facility]  PAP: Status post hysterectomy  Bone density:10/11/2013 at Dr. Noland Fordyce, normal (lowest T score of -0.2   Lipid panel:  Allergies  Allergen Reactions  . Ace Inhibitors   . Morphine And Related   . Oxycodone-Acetaminophen   . Sulfa Antibiotics     Current Outpatient Prescriptions  Medication Sig Dispense Refill  . ALPRAZolam (XANAX) 0.5 MG tablet Take 0.5 mg by mouth. 1/2 as needed       . DULoxetine (CYMBALTA) 60 MG capsule Take 60 mg by mouth daily.      . metoprolol (TOPROL-XL) 100 MG 24 hr tablet Take 100 mg by mouth daily.        . naproxen sodium (ALEVE) 220 MG tablet Take 220 mg by mouth. As needed       . nystatin-triamcinolone (MYCOLOG II) cream       . OMEGA-3 1000 MG CAPS Take 1 capsule by mouth daily.        [name] Kitchen omeprazole (PRILOSEC OTC) 20 MG tablet Take 20 mg by mouth daily.        . pravastatin (PRAVACHOL) 10 MG tablet Take 10 mg by mouth daily.      . rosuvastatin (CRESTOR) 5 MG tablet  Take 5 mg by mouth daily.        . sertraline (ZOLOFT) 100 MG tablet Take 150 mg by mouth daily.        [name] Kitchen  telmisartan-hydrochlorothiazide (MICARDIS HCT) 80-12.5 MG per tablet Take 1 tablet by mouth daily.        . valsartan-hydrochlorothiazide (DIOVAN-HCT) 320-25 MG per tablet Take 1 tablet by mouth daily.      [name] Kitchen VITAMIN D, CHOLECALCIFEROL, PO Take 1 tablet by mouth daily.         Current Facility-Administered Medications  Medication Dose Route Frequency Provider Last Rate Last Dose  . 0.9 %  sodium chloride infusion  500 mL Intravenous Continuous Gatha Mayer, MD        OBJECTIVE: Older white woman who appears stated age  1 Vitals:   02/20/14 0900  BP: 137/83  Pulse: 84  Temp: 98.5 F (36.9 C)  Resp: 20     Body mass index is 45.31 kg/(m^2).    ECOG FS:1 - Symptomatic but completely ambulatory  Ocular: Sclerae unicteric, pupils equal, round and reactive to light Ear-nose-throat: Oropharynx clear,no lesions noted  Lymphatic: No cervical or supraclavicular adenopathy Lungs no rales or rhonchi,  there excursion bilaterally Heart regular rate and rhythm, no murmur appreciated Abd soft,  obese, nontender, positive bowel sounds MSK no focal spinal tenderness her  Neuro: non-focal, well-oriented, appropriate affect Breasts: The right breast is unremarkable. The left breast systems was recent biopsy. There is a minimal ecchymosis. I do not palpate a mass. There is no skin or nipple changes of concern. The left axilla is benign.   LAB RESULTS:  CMP     Component Value Date/Time   NA 144 02/20/2014 0843   NA 141 01/18/2013 2016   K 3.9 02/20/2014 0843   K 4.0 01/18/2013 2016   CL 101 01/18/2013 2016   CO2 28 02/20/2014 0843   CO2 31 01/18/2013 2016   GLUCOSE 105 02/20/2014 0843   GLUCOSE 103* 01/18/2013 2016   BUN 21.4 02/20/2014 0843   BUN 20 01/18/2013 2016   CREATININE 0.9 02/20/2014 0843   CREATININE 0.90 01/18/2013 2016   CALCIUM 9.5 02/20/2014 0843   CALCIUM 9.8 01/18/2013 2016   PROT 7.2 02/20/2014 0843   ALBUMIN 3.5 02/20/2014 0843   AST 28 02/20/2014 0843   ALT 33 02/20/2014 0843     ALKPHOS 75 02/20/2014 0843   BILITOT 0.39 02/20/2014 0843   GFRNONAA 63* 01/18/2013 2016   GFRAA 73* 01/18/2013 2016    I No results found for this basename: SPEP, UPEP,  kappa and lambda light chains    Lab Results  Component Value Date   WBC 9.4 02/20/2014   NEUTROABS 4.9 02/20/2014   HGB 13.2 02/20/2014   HCT 39.6 02/20/2014   MCV 90.4 02/20/2014   PLT 276 02/20/2014      Chemistry      Component Value Date/Time   NA 144 02/20/2014 0843   NA 141 01/18/2013 2016   K 3.9 02/20/2014 0843   K 4.0 01/18/2013 2016   CL 101 01/18/2013 2016   CO2 28 02/20/2014 0843   CO2 31 01/18/2013 2016   BUN 21.4 02/20/2014 0843   BUN 20 01/18/2013 2016   CREATININE 0.9 02/20/2014 0843   CREATININE 0.90 01/18/2013 2016      Component Value Date/Time   CALCIUM 9.5 02/20/2014 0843   CALCIUM 9.8 01/18/2013 2016   ALKPHOS 75 02/20/2014 0843   AST 28 02/20/2014 0843  ALT 33 02/20/2014 0843   BILITOT 0.39 02/20/2014 0843       No results found for this basename: LABCA2    No components found with this basename: LABCA125    No results found for this basename: INR,  in the last 168 hours  Urinalysis No results found for this basename: colorurine, appearanceur, labspec, phurine, glucoseu, hgbur, bilirubinur, ketonesur, proteinur, urobilinogen, nitrite, leukocytesur    STUDIES: Mm Digital Diagnostic Unilat L  02/15/2014   ADDENDUM REPORT: 02/15/2014 14:23  ADDENDUM: Pathology results: Results from the left breast biopsy at the 7 o'clock position revealed grade 2 IDC & DCIS. This is concordant with the imaging findings. The patient has been notified of the results. She is doing well and denies any biopsy site complications.  She has a followup appointment with Multidisciplinary Clinic 02/20/2014 at 8 a.m. and a breast MRI is scheduled 02/22/2014 at 9:30 a.m. The patient has been instructed to call the Breast Center with any questions or concerns.   Electronically Signed   By: Edwin Cap M.D.   On:  02/15/2014 14:23   02/15/2014   CLINICAL DATA:  Status post ultrasound-guided core needle biopsy left breast mass 7 o'clock position  EXAM: DIAGNOSTIC LEFT MAMMOGRAM POST ULTRASOUND BIOPSY  COMPARISON:  Previous exams  FINDINGS: Mammographic images were obtained following ultrasound guided biopsy of suspicious mass left breast 7 o'clock position. Ribbon shaped biopsy marking clip in appropriate position.  IMPRESSION: Appropriate position biopsy marking clip status post ultrasound-guided core needle biopsy suspicious left breast mass 7 o'clock position.  Final Assessment: Post Procedure Mammograms for Marker Placement  Electronically Signed: By: Annia Belt M.D. On: 02/14/2014 09:37   Mm Digital Diagnostic Unilat L  02/08/2014   CLINICAL DATA:  The patient presents for additional views of the left breast as followup to a recent screening exam to evaluate a possible mass.  EXAM: DIGITAL DIAGNOSTIC  left MAMMOGRAM  ULTRASOUND left BREAST  COMPARISON:  Previous exams.  ACR Breast Density Category b: There are scattered areas of fibroglandular density.  FINDINGS: Spot compression and true lateral images demonstrate a 1.1 cm ovoid mass with spiculated borders over the inner lower breast.  Ultrasound is performed, showing a mass of irregular shape and borders at the 7 o'clock position of the left breast 7 cm from the nipple corresponding to the mammographic abnormality. This mass measures 0.8 x 1 x 1 cm. Ultrasound of the left axilla is unremarkable.  IMPRESSION: Hypoechoic mass of irregular shape and borders at the 7 o'clock position of the left breast 7 cm from the nipple measuring 0.8 x 1 x 1 cm.  RECOMMENDATION: Recommend ultrasound-guided core needle biopsy of this suspicious mass.  I have discussed the findings and recommendations with the patient. Results were also provided in writing at the conclusion of the visit. If applicable, a reminder letter will be sent to the patient regarding the next appointment.   BI-RADS CATEGORY  4: Suspicious.  Biopsy scheduled for 02/14/2014 at 8 a.m.   Electronically Signed   By: Elberta Fortis M.D.   On: 02/08/2014 13:37   US Breast Ltd Uni Left Inc Axilla  02/08/2014   CLINICAL DATA:  The patient presents for additional views of the left breast as followup to a recent screening exam to evaluate a possible mass.  EXAM: DIGITAL DIAGNOSTIC  left MAMMOGRAM  ULTRASOUND left BREAST  COMPARISON:  Previous exams.  ACR Breast Density Category b: There are scattered areas of fibroglandular density.  FINDINGS: Spot  compression and true lateral images demonstrate a 1.1 cm ovoid mass with spiculated borders over the inner lower breast.  Ultrasound is performed, showing a mass of irregular shape and borders at the 7 o'clock position of the left breast 7 cm from the nipple corresponding to the mammographic abnormality. This mass measures 0.8 x 1 x 1 cm. Ultrasound of the left axilla is unremarkable.  IMPRESSION: Hypoechoic mass of irregular shape and borders at the 7 o'clock position of the left breast 7 cm from the nipple measuring 0.8 x 1 x 1 cm.  RECOMMENDATION: Recommend ultrasound-guided core needle biopsy of this suspicious mass.  I have discussed the findings and recommendations with the patient. Results were also provided in writing at the conclusion of the visit. If applicable, a reminder letter will be sent to the patient regarding the next appointment.  BI-RADS CATEGORY  4: Suspicious.  Biopsy scheduled for 02/14/2014 at 8 a.m.   Electronically Signed   By: Marin Olp M.D.   On: 02/08/2014 13:37   [place] Lt Breast Bx W Loc Dev 1st Lesion Img Bx Spec US Guide  02/14/2014   CLINICAL DATA:  Patient with suspicious left breast mass.  EXAM: ULTRASOUND GUIDED LEFT BREAST CORE NEEDLE BIOPSY  COMPARISON:  Previous exams.  PROCEDURE: I met with the patient and we discussed the procedure of ultrasound-guided biopsy, including benefits and alternatives. We discussed the high likelihood of a  successful procedure. We discussed the risks of the procedure including infection, bleeding, tissue injury, clip migration, and inadequate sampling. Informed written consent was given. The usual time-out protocol was performed immediately prior to the procedure.  Using sterile technique and 2% Lidocaine as local anesthetic, under direct ultrasound visualization, a 12 gauge vacuum-assisteddevice was used to perform biopsy of a suspicious left breast mass 7 o'clock position using a medial approach. At the conclusion of the procedure, a ribbon shaped tissue marker clip was deployed into the biopsy cavity. Follow-up 2-view mammogram was performed and dictated separately.  IMPRESSION: Ultrasound-guided biopsy of suspicious left breast mass 7 o'clock position. No apparent complications.   Electronically Signed   By: Lovey Newcomer M.D.   On: 02/14/2014 09:35    ASSESSMENT: 73 y.o.  [place] woman status post left breast biopsy 02/14/2014 for a clinical T1b N0, stage IA invasive ductal carcinoma, grade 2, estrogen and progesterone receptor positive, with a low MIB-1-at 10%, and HER-2 results pending  PLAN: We spent the better part of today's hour-long appointment discussing the biology of breast cancer in general, and the specifics of the patient's tumor in particular. Naomee understands the treatment of breast cancer has local and systemic components. The local components are surgery and radiation. She is a very good candidate for breast conservation. The question how much benefit she would get from radiation will be discussed by Dr. Pablo Ledger. The patient may be a candidate for MammoSite radiation.  Once the local treatment has been completed, she will be ready for systemic treatment. Of course if she turns out to have a HER-2 positive tumor she will benefit from an anti-HER-2 treatment and generally requires some chemotherapy, at least paclitaxel weekly x12. Most likely however, her cancer will be HER-2 negative.  She will benefit from an anti-estrogens in terms of reduction in risk of local recurrence and systemic recurrence as well as reduction in the risk of a new breast cancer developing. Given that she has a normal bone density she would be a very good candidate for an aromatase inhibitor.  With a very small node negative tumor with a low proliferation fraction in a woman over 70 I see little point in sending an Oncotype as suggested by NCCN guidelines. The benefit of chemotherapy will clearly be less than 5% in terms of risk reduction. Accordingly I am comfortable with her forgoing that treatment option.  Jenefer will return to see me at the completion of her local treatment. We will start her on anastrozole at that time. She has a good understanding of the overall plan and agrees with it. She knows the goal of treatment in her case is cure. She will call with any problems that may develop before her next visit here.   Chauncey Cruel, MD   02/20/2014 10:08 AM

## 2014-02-20 NOTE — Progress Notes (Signed)
[place] Psychosocial Distress Screening Clinical Social Work  Patient completed distress screening protocol, and scored a 7 on the Psychosocial Distress Thermometer which indicates moderate distress. Clinical Social Worker met with pt in [facility] to assess for distress and other psychosocial needs.  Pt stated she was feeling "ok", and was "relieved" to have more information on her treatment plan.  CSW and Pt discussed taking her treatment "one step at a time" to reduce additional stress and anxiety.  CSW informed pt of the support team and support services at [facility], and encouraged pt to call with any questions or concerns.       ONCBCN DISTRESS SCREENING 02/20/2014  Screening Type Initial Screening  Elta [place] the number that describes how much distress you have been experiencing in the past week 7  Practical problem type Insurance  Emotional problem type Nervousness/Anxiety  Information Concerns Type Lack of info about diagnosis;Lack of info about treatment  Physician notified of physical symptoms Yes  Referral to clinical psychology No  Referral to clinical social work Yes  Referral to dietition No  Referral to financial advocate No  Referral to support programs No  Referral to palliative care  No     Johnnye Lana, MSW, [place] [phone]

## 2014-02-22 ENCOUNTER — Other Ambulatory Visit: Payer: Medicare Other

## 2014-02-22 ENCOUNTER — Telehealth (INDEPENDENT_AMBULATORY_CARE_PROVIDER_SITE_OTHER): Payer: Self-pay

## 2014-02-22 NOTE — Telephone Encounter (Signed)
Please call [phone]  [phone] with sx date/time

## 2014-02-25 ENCOUNTER — Other Ambulatory Visit (INDEPENDENT_AMBULATORY_CARE_PROVIDER_SITE_OTHER): Payer: Self-pay | Admitting: Surgery

## 2014-02-25 DIAGNOSIS — C50212 Malignant neoplasm of upper-inner quadrant of left female breast: Secondary | ICD-10-CM

## 2014-02-26 ENCOUNTER — Telehealth: Payer: Self-pay | Admitting: *Deleted

## 2014-02-26 NOTE — Telephone Encounter (Signed)
Left vm for pt to return call regarding [place] from 02/20/14.

## 2014-02-27 ENCOUNTER — Encounter: Payer: Self-pay | Admitting: *Deleted

## 2014-02-27 NOTE — Progress Notes (Signed)
Spoke to pt concerning [place] .  Pt denies questions or concerns regarding dx or treatment care plan.  Discussed with pt where she can purchase front closed sport bra.  Also discuss with pt the need for local control with radiation.  Received verbal understanding.  Encourage pt to call with needs. Contact information given.

## 2014-03-03 ENCOUNTER — Telehealth: Payer: Self-pay | Admitting: Oncology

## 2014-03-03 NOTE — Telephone Encounter (Signed)
s/w pt re apptfor 9/8

## 2014-03-05 ENCOUNTER — Encounter (HOSPITAL_BASED_OUTPATIENT_CLINIC_OR_DEPARTMENT_OTHER): Payer: Self-pay | Admitting: *Deleted

## 2014-03-05 NOTE — Progress Notes (Signed)
03/05/14 1642  OBSTRUCTIVE SLEEP APNEA  Have you ever been diagnosed with sleep apnea through a sleep study? No  Do you snore loudly (loud enough to be heard through closed doors)?  1  Do you often feel tired, fatigued, or sleepy during the daytime? 0  Has anyone observed you stop breathing during your sleep? 0  Do you have, or are you being treated for high blood pressure? 1  BMI more than 35 kg/m2? 1  Age over 72 years old? 1  Neck circumference greater than 40 cm/16 inches? 1  Gender: 0  Obstructive Sleep Apnea Score 5  Score 4 or greater  Results sent to PCP

## 2014-03-05 NOTE — Progress Notes (Signed)
To come in for ekg-denies any cardiac,resp or sleep apnea

## 2014-03-07 ENCOUNTER — Encounter (HOSPITAL_BASED_OUTPATIENT_CLINIC_OR_DEPARTMENT_OTHER)
Admission: RE | Admit: 2014-03-07 | Discharge: 2014-03-07 | Disposition: A | Discharge status: Home / Self Care | Payer: Medicare Other | Source: Ambulatory Visit | Attending: Surgery | Admitting: Surgery

## 2014-03-07 ENCOUNTER — Ambulatory Visit
Admission: RE | Admit: 2014-03-07 | Discharge: 2014-03-07 | Disposition: A | Discharge status: Home / Self Care | Payer: Medicare Other | Source: Ambulatory Visit | Attending: Surgery | Admitting: Surgery

## 2014-03-07 DIAGNOSIS — C50212 Malignant neoplasm of upper-inner quadrant of left female breast: Secondary | ICD-10-CM

## 2014-03-07 DIAGNOSIS — Z01810 Encounter for preprocedural cardiovascular examination: Secondary | ICD-10-CM | POA: Insufficient documentation

## 2014-03-11 ENCOUNTER — Encounter (HOSPITAL_BASED_OUTPATIENT_CLINIC_OR_DEPARTMENT_OTHER): Payer: Medicare Other | Admitting: Anesthesiology

## 2014-03-11 ENCOUNTER — Ambulatory Visit (HOSPITAL_BASED_OUTPATIENT_CLINIC_OR_DEPARTMENT_OTHER): Payer: Medicare Other | Admitting: Anesthesiology

## 2014-03-11 ENCOUNTER — Encounter (HOSPITAL_BASED_OUTPATIENT_CLINIC_OR_DEPARTMENT_OTHER): Payer: Self-pay

## 2014-03-11 ENCOUNTER — Ambulatory Visit
Admission: RE | Admit: 2014-03-11 | Discharge: 2014-03-11 | Disposition: A | Discharge status: Home / Self Care | Payer: Medicare Other | Source: Ambulatory Visit | Attending: Surgery | Admitting: Surgery

## 2014-03-11 ENCOUNTER — Encounter (HOSPITAL_COMMUNITY)
Admission: RE | Admit: 2014-03-11 | Discharge: 2014-03-11 | Disposition: A | Discharge status: Home / Self Care | Payer: Medicare Other | Source: Ambulatory Visit | Attending: Surgery | Admitting: Surgery

## 2014-03-11 ENCOUNTER — Encounter (HOSPITAL_BASED_OUTPATIENT_CLINIC_OR_DEPARTMENT_OTHER)
Admission: RE | Disposition: A | Discharge status: Home / Self Care | Payer: Self-pay | Source: Ambulatory Visit | Attending: Surgery

## 2014-03-11 ENCOUNTER — Ambulatory Visit (HOSPITAL_BASED_OUTPATIENT_CLINIC_OR_DEPARTMENT_OTHER)
Admission: RE | Admit: 2014-03-11 | Discharge: 2014-03-11 | Disposition: A | Discharge status: Home / Self Care | Payer: Medicare Other | Source: Ambulatory Visit | Attending: Surgery | Admitting: Surgery

## 2014-03-11 DIAGNOSIS — C50312 Malignant neoplasm of lower-inner quadrant of left female breast: Secondary | ICD-10-CM

## 2014-03-11 DIAGNOSIS — C50212 Malignant neoplasm of upper-inner quadrant of left female breast: Secondary | ICD-10-CM

## 2014-03-11 DIAGNOSIS — J45909 Unspecified asthma, uncomplicated: Secondary | ICD-10-CM | POA: Insufficient documentation

## 2014-03-11 DIAGNOSIS — R262 Difficulty in walking, not elsewhere classified: Secondary | ICD-10-CM | POA: Insufficient documentation

## 2014-03-11 DIAGNOSIS — K219 Gastro-esophageal reflux disease without esophagitis: Secondary | ICD-10-CM | POA: Insufficient documentation

## 2014-03-11 DIAGNOSIS — F329 Major depressive disorder, single episode, unspecified: Secondary | ICD-10-CM | POA: Insufficient documentation

## 2014-03-11 DIAGNOSIS — Z6835 Body mass index (BMI) 35.0-35.9, adult: Secondary | ICD-10-CM | POA: Insufficient documentation

## 2014-03-11 DIAGNOSIS — I10 Essential (primary) hypertension: Secondary | ICD-10-CM | POA: Insufficient documentation

## 2014-03-11 DIAGNOSIS — F411 Generalized anxiety disorder: Secondary | ICD-10-CM | POA: Insufficient documentation

## 2014-03-11 DIAGNOSIS — D0590 Unspecified type of carcinoma in situ of unspecified breast: Secondary | ICD-10-CM

## 2014-03-11 DIAGNOSIS — F40298 Other specified phobia: Secondary | ICD-10-CM | POA: Insufficient documentation

## 2014-03-11 DIAGNOSIS — E785 Hyperlipidemia, unspecified: Secondary | ICD-10-CM | POA: Insufficient documentation

## 2014-03-11 DIAGNOSIS — F3289 Other specified depressive episodes: Secondary | ICD-10-CM | POA: Insufficient documentation

## 2014-03-11 DIAGNOSIS — C50319 Malignant neoplasm of lower-inner quadrant of unspecified female breast: Secondary | ICD-10-CM | POA: Insufficient documentation

## 2014-03-11 HISTORY — PX: BREAST LUMPECTOMY: SHX2

## 2014-03-11 HISTORY — DX: Presence of spectacles and contact lenses: Z97.3

## 2014-03-11 HISTORY — DX: Adverse effect of unspecified anesthetic, initial encounter: T41.45XA

## 2014-03-11 HISTORY — DX: Other complications of anesthesia, initial encounter: T88.59XA

## 2014-03-11 HISTORY — DX: Presence of dental prosthetic device (complete) (partial): Z97.2

## 2014-03-11 LAB — POCT HEMOGLOBIN-HEMACUE: HEMOGLOBIN: 13.7 g/dL (ref 12.0–15.0)

## 2014-03-11 SURGERY — RADIOACTIVE SEED GUIDED PARTIAL MASTECTOMY WITH AXILLARY SENTINEL LYMPH NODE BIOPSY
Anesthesia: General | Site: Breast | Laterality: Left

## 2014-03-11 MED ORDER — CHLORHEXIDINE GLUCONATE 4 % EX LIQD: 1.0000 "application " | Freq: Once | CUTANEOUS | Status: DC

## 2014-03-11 MED ORDER — BUPIVACAINE-EPINEPHRINE (PF) 0.25% -1:200000 IJ SOLN
INTRAMUSCULAR | Status: AC
  Filled 2014-03-11: qty 30

## 2014-03-11 MED ORDER — SODIUM CHLORIDE 0.9 % IJ SOLN
INTRAMUSCULAR | Status: AC
  Filled 2014-03-11: qty 10

## 2014-03-11 MED ORDER — MIDAZOLAM HCL 5 MG/5ML IJ SOLN
INTRAMUSCULAR | Status: DC | PRN
  Administered 2014-03-11: 2 mg via INTRAVENOUS

## 2014-03-11 MED ORDER — FENTANYL CITRATE 0.05 MG/ML IJ SOLN
INTRAMUSCULAR | Status: AC
  Filled 2014-03-11: qty 2

## 2014-03-11 MED ORDER — MEPERIDINE HCL 25 MG/ML IJ SOLN: 6.2500 mg | INTRAMUSCULAR | Status: DC | PRN

## 2014-03-11 MED ORDER — HYDROCODONE-ACETAMINOPHEN 5-325 MG PO TABS: 1.0000 | ORAL_TABLET | Freq: Four times a day (QID) | ORAL | Status: DC | PRN

## 2014-03-11 MED ORDER — BUPIVACAINE-EPINEPHRINE (PF) 0.5% -1:200000 IJ SOLN
INTRAMUSCULAR | Status: AC
  Filled 2014-03-11: qty 30

## 2014-03-11 MED ORDER — BUPIVACAINE HCL (PF) 0.25 % IJ SOLN
INTRAMUSCULAR | Status: AC
  Filled 2014-03-11: qty 30

## 2014-03-11 MED ORDER — DEXAMETHASONE SODIUM PHOSPHATE 4 MG/ML IJ SOLN
INTRAMUSCULAR | Status: DC | PRN
  Administered 2014-03-11: 10 mg via INTRAVENOUS

## 2014-03-11 MED ORDER — HYDROMORPHONE HCL PF 1 MG/ML IJ SOLN
INTRAMUSCULAR | Status: AC
  Filled 2014-03-11: qty 1

## 2014-03-11 MED ORDER — LIDOCAINE HCL (CARDIAC) 20 MG/ML IV SOLN
INTRAVENOUS | Status: DC | PRN
  Administered 2014-03-11: 75 mg via INTRAVENOUS

## 2014-03-11 MED ORDER — FENTANYL CITRATE 0.05 MG/ML IJ SOLN
INTRAMUSCULAR | Status: AC
  Filled 2014-03-11: qty 6

## 2014-03-11 MED ORDER — OXYCODONE HCL 5 MG/5ML PO SOLN: 5.0000 mg | Freq: Once | ORAL | Status: DC | PRN

## 2014-03-11 MED ORDER — CEFAZOLIN SODIUM-DEXTROSE 2-3 GM-% IV SOLR
2.0000 g | INTRAVENOUS | Status: AC
  Administered 2014-03-11: 2 g via INTRAVENOUS

## 2014-03-11 MED ORDER — PROPOFOL 10 MG/ML IV BOLUS
INTRAVENOUS | Status: DC | PRN
  Administered 2014-03-11: 200 mg via INTRAVENOUS

## 2014-03-11 MED ORDER — TECHNETIUM TC 99M SULFUR COLLOID FILTERED
1.0000 | Freq: Once | INTRAVENOUS | Status: AC | PRN
  Administered 2014-03-11: 1 via INTRADERMAL

## 2014-03-11 MED ORDER — OXYCODONE HCL 5 MG PO TABS: 5.0000 mg | ORAL_TABLET | Freq: Once | ORAL | Status: DC | PRN

## 2014-03-11 MED ORDER — FENTANYL CITRATE 0.05 MG/ML IJ SOLN
INTRAMUSCULAR | Status: DC | PRN
  Administered 2014-03-11: 100 ug via INTRAVENOUS

## 2014-03-11 MED ORDER — METHYLENE BLUE 1 % INJ SOLN
INTRAMUSCULAR | Status: AC
Start: 2014-03-11 — End: 2014-03-11
  Filled 2014-03-11: qty 10

## 2014-03-11 MED ORDER — CEFAZOLIN SODIUM-DEXTROSE 2-3 GM-% IV SOLR
INTRAVENOUS | Status: AC
  Filled 2014-03-11: qty 50

## 2014-03-11 MED ORDER — MIDAZOLAM HCL 2 MG/2ML IJ SOLN
INTRAMUSCULAR | Status: AC
  Filled 2014-03-11: qty 2

## 2014-03-11 MED ORDER — FENTANYL CITRATE 0.05 MG/ML IJ SOLN
50.0000 ug | INTRAMUSCULAR | Status: DC | PRN
  Administered 2014-03-11: 50 ug via INTRAVENOUS

## 2014-03-11 MED ORDER — BUPIVACAINE-EPINEPHRINE (PF) 0.25% -1:200000 IJ SOLN
INTRAMUSCULAR | Status: DC | PRN
  Administered 2014-03-11: 30 mL

## 2014-03-11 MED ORDER — LACTATED RINGERS IV SOLN
INTRAVENOUS | Status: DC
  Administered 2014-03-11 (×2): via INTRAVENOUS
  Administered 2014-03-11: 10 mL/h via INTRAVENOUS

## 2014-03-11 MED ORDER — ONDANSETRON HCL 4 MG/2ML IJ SOLN: 4.0000 mg | Freq: Once | INTRAMUSCULAR | Status: DC | PRN

## 2014-03-11 MED ORDER — MIDAZOLAM HCL 2 MG/2ML IJ SOLN
1.0000 mg | INTRAMUSCULAR | Status: DC | PRN
  Administered 2014-03-11: 1 mg via INTRAVENOUS

## 2014-03-11 MED ORDER — HYDROMORPHONE HCL PF 1 MG/ML IJ SOLN
0.2500 mg | INTRAMUSCULAR | Status: DC | PRN
  Administered 2014-03-11: 0.5 mg via INTRAVENOUS
  Administered 2014-03-11: 0.25 mg via INTRAVENOUS
  Administered 2014-03-11: 0.5 mg via INTRAVENOUS

## 2014-03-11 SURGICAL SUPPLY — 59 items
ADH SKN CLS APL DERMABOND .7 (GAUZE/BANDAGES/DRESSINGS) ×1
APL SKNCLS STERI-STRIP NONHPOA (GAUZE/BANDAGES/DRESSINGS)
BENZOIN TINCTURE PRP APPL 2/3 (GAUZE/BANDAGES/DRESSINGS) IMPLANT
BINDER BREAST LRG (GAUZE/BANDAGES/DRESSINGS) IMPLANT
BINDER BREAST MEDIUM (GAUZE/BANDAGES/DRESSINGS) IMPLANT
BINDER BREAST XLRG (GAUZE/BANDAGES/DRESSINGS) IMPLANT
BINDER BREAST XXLRG (GAUZE/BANDAGES/DRESSINGS) ×3 IMPLANT
BLADE HEX COATED 2.75 (ELECTRODE) ×1 IMPLANT
BLADE SURG 10 STRL SS (BLADE) ×3 IMPLANT
BLADE SURG 15 STRL LF DISP TIS (BLADE) ×1 IMPLANT
BLADE SURG 15 STRL SS (BLADE) ×3
CANISTER SUC SOCK COL 7IN (MISCELLANEOUS) IMPLANT
CANISTER SUCT 1200ML W/VALVE (MISCELLANEOUS) ×3 IMPLANT
CHLORAPREP W/TINT 26ML (MISCELLANEOUS) ×3 IMPLANT
CLIP TI WIDE RED SMALL 6 (CLIP) ×2 IMPLANT
CLOSURE WOUND 1/4X4 (GAUZE/BANDAGES/DRESSINGS)
COVER MAYO STAND STRL (DRAPES) ×3 IMPLANT
COVER PROBE W GEL 5X96 (DRAPES) ×3 IMPLANT
COVER TABLE BACK 60X90 (DRAPES) ×3 IMPLANT
DECANTER SPIKE VIAL GLASS SM (MISCELLANEOUS) IMPLANT
DERMABOND ADVANCED (GAUZE/BANDAGES/DRESSINGS) ×2
DERMABOND ADVANCED .7 DNX12 (GAUZE/BANDAGES/DRESSINGS) IMPLANT
DEVICE DUBIN W/COMP PLATE 8390 (MISCELLANEOUS) ×3 IMPLANT
DRAIN CHANNEL 19F RND (DRAIN) IMPLANT
DRAPE PED LAPAROTOMY (DRAPES) ×3 IMPLANT
DRAPE UTILITY XL STRL (DRAPES) ×3 IMPLANT
DRSG PAD ABDOMINAL 8X10 ST (GAUZE/BANDAGES/DRESSINGS) IMPLANT
ELECT COATED BLADE 2.86 ST (ELECTRODE) ×3 IMPLANT
ELECT REM PT RETURN 9FT ADLT (ELECTROSURGICAL) ×3
ELECTRODE REM PT RTRN 9FT ADLT (ELECTROSURGICAL) ×1 IMPLANT
GLOVE BIO SURGEON STRL SZ7 (GLOVE) ×2 IMPLANT
GLOVE BIOGEL PI IND STRL 7.5 (GLOVE) IMPLANT
GLOVE BIOGEL PI INDICATOR 7.5 (GLOVE) ×2
GLOVE EUDERMIC 7 POWDERFREE (GLOVE) ×3 IMPLANT
GLOVE EXAM NITRILE PF MED BLUE (GLOVE) ×2 IMPLANT
GLOVE SURG SIGNA 7.5 PF LTX (GLOVE) ×5 IMPLANT
GOWN STRL REUS W/ TWL LRG LVL3 (GOWN DISPOSABLE) ×1 IMPLANT
GOWN STRL REUS W/ TWL XL LVL3 (GOWN DISPOSABLE) ×1 IMPLANT
GOWN STRL REUS W/TWL LRG LVL3 (GOWN DISPOSABLE) ×3
GOWN STRL REUS W/TWL XL LVL3 (GOWN DISPOSABLE) ×6
KIT MARKER MARGIN INK (KITS) ×2 IMPLANT
NDL HYPO 25X1 1.5 SAFETY (NEEDLE) ×1 IMPLANT
NEEDLE HYPO 25X1 1.5 SAFETY (NEEDLE) ×3 IMPLANT
NS IRRIG 1000ML POUR BTL (IV SOLUTION) ×4 IMPLANT
PACK BASIN DAY SURGERY FS (CUSTOM PROCEDURE TRAY) ×3 IMPLANT
PENCIL BUTTON HOLSTER BLD 10FT (ELECTRODE) ×3 IMPLANT
PIN SAFETY STERILE (MISCELLANEOUS) ×1 IMPLANT
SLEEVE SCD COMPRESS KNEE MED (MISCELLANEOUS) ×3 IMPLANT
SPONGE GAUZE 4X4 12PLY STER LF (GAUZE/BANDAGES/DRESSINGS) ×3 IMPLANT
SPONGE LAP 18X18 X RAY DECT (DISPOSABLE) ×2 IMPLANT
STRIP CLOSURE SKIN 1/4X4 (GAUZE/BANDAGES/DRESSINGS) IMPLANT
SUT MON AB 5-0 PS2 18 (SUTURE) ×3 IMPLANT
SUT VICRYL 3-0 CR8 SH (SUTURE) ×3 IMPLANT
SYR CONTROL 10ML LL (SYRINGE) ×3 IMPLANT
TOWEL OR 17X24 6PK STRL BLUE (TOWEL DISPOSABLE) ×3 IMPLANT
TOWEL OR NON WOVEN STRL DISP B (DISPOSABLE) ×1 IMPLANT
TUBE CONNECTING 20'X1/4 (TUBING) ×1
TUBE CONNECTING 20X1/4 (TUBING) ×2 IMPLANT
YANKAUER SUCT BULB TIP NO VENT (SUCTIONS) ×3 IMPLANT

## 2014-03-11 NOTE — Interval H&P Note (Signed)
History and Physical Interval Note:  03/11/2014 12:19 PM  Candice Reid  has presented today for surgery, with the diagnosis of left breast cancer  The various methods of treatment have been discussed with the patient and family.  Seed identified in breast pre op.  Son and daughter are with patient.  After consideration of risks, benefits and other options for treatment, the patient has consented to  Procedure(s): LEFT RADIOACTIVE SEED GUIDED PARTIAL LUMPECTOMY WITH LEFT AXILLARY SENTINEL LYMPH NODE BIOPSY (Left) as a surgical intervention .  The patient's history has been reviewed, patient examined, no change in status, stable for surgery.  I have reviewed the patient's chart and labs.  Questions were answered to the patient's satisfaction.     Dannel Rafter H

## 2014-03-11 NOTE — Anesthesia Postprocedure Evaluation (Signed)
Anesthesia Post Note  Patient: Candice Reid  Procedure(s) Performed: Procedure(s) (LRB): LEFT RADIOACTIVE SEED GUIDED PARTIAL LUMPECTOMY WITH LEFT AXILLARY SENTINEL LYMPH NODE BIOPSY (Left)  Anesthesia type: general  Patient location: PACU  Post pain: Pain level controlled  Post assessment: Patient's Cardiovascular Status Stable  Last Vitals:  Filed Vitals:   03/11/14 1656  BP: 153/88  Pulse: 102  Temp: 36.3 C  Resp: 18    Post vital signs: Reviewed and stable  Level of consciousness: sedated  Complications: No apparent anesthesia complications

## 2014-03-11 NOTE — Discharge Instructions (Signed)
[facility] - DISCHARGE INSTRUCTIONS TO PATIENT  Activity:  Driving - May drive in 2 or three days, if doing well.   Lifting - Take it easy for 5 days, then no limit  Wound Care:   Leave bandage for 2 days, then remove and shower.  Diet:  As tolerated  Follow up appointment:  Call Dr. Pollie Friar office [facility]) at [phone] for an appointment in 2 weeks.  If you have not heard the pathology report, please call our office on Friday, 6/19.  Medications and dosages:  Resume your home medications.  You have a prescription for:  Vicodin  Call Dr. Lucia Gaskins or his office  [phone]) if you have:  Temperature greater than 100.4,  Persistent nausea and vomiting,  Severe uncontrolled pain,  Redness, tenderness, or signs of infection (pain, swelling, redness, odor or green/yellow discharge around the site),  Difficulty breathing, headache or visual disturbances,  Any other questions or concerns you may have after discharge.  In an emergency, call 911 or go to an Emergency Department at a nearby hospital.   Post Anesthesia Home Care Instructions  Activity: Get plenty of rest for the remainder of the day. A responsible adult should stay with you for 24 hours following the procedure.  For the next 24 hours, DO NOT: -Drive a car -Paediatric nurse -Drink alcoholic beverages -Take any medication unless instructed by your physician -Make any legal decisions or sign important papers.  Meals: Start with liquid foods such as gelatin or soup. Progress to regular foods as tolerated. Avoid greasy, spicy, heavy foods. If nausea and/or vomiting occur, drink only clear liquids until the nausea and/or vomiting subsides. Call your physician if vomiting continues.  Special Instructions/Symptoms: Your throat may feel dry or sore from the anesthesia or the breathing tube placed in your throat during surgery. If this causes discomfort, gargle with warm salt water. The  discomfort should disappear within 24 hours.

## 2014-03-11 NOTE — Anesthesia Preprocedure Evaluation (Signed)
Anesthesia Evaluation  Patient identified by MRN, date of birth, ID band Patient awake    Reviewed: Allergy & Precautions, H&P , NPO status , Patient's Chart, lab work & pertinent test results  Airway Mallampati: I TM Distance: >3 FB Neck ROM: Full    Dental   Pulmonary          Cardiovascular hypertension, Pt. on medications     Neuro/Psych    GI/Hepatic GERD-  Medicated and Controlled,  Endo/Other    Renal/GU      Musculoskeletal   Abdominal   Peds  Hematology   Anesthesia Other Findings   Reproductive/Obstetrics                           Anesthesia Physical Anesthesia Plan  ASA: II  Anesthesia Plan: General   Post-op Pain Management:    Induction: Intravenous  Airway Management Planned: LMA  Additional Equipment:   Intra-op Plan:   Post-operative Plan: Extubation in OR  Informed Consent: I have reviewed the patients History and Physical, chart, labs and discussed the procedure including the risks, benefits and alternatives for the proposed anesthesia with the patient or authorized representative who has indicated his/her understanding and acceptance.     Plan Discussed with: CRNA and Surgeon  Anesthesia Plan Comments:         Anesthesia Quick Evaluation

## 2014-03-11 NOTE — Anesthesia Postprocedure Evaluation (Signed)
Anesthesia Post Note  Patient: Candice Reid  Procedure(s) Performed: Procedure(s) (LRB): LEFT RADIOACTIVE SEED GUIDED PARTIAL LUMPECTOMY WITH LEFT AXILLARY SENTINEL LYMPH NODE BIOPSY (Left)  Anesthesia type: general  Patient location: PACU  Post pain: Pain level controlled  Post assessment: Patient's Cardiovascular Status Stable  Last Vitals:  Filed Vitals:   03/11/14 1430  BP:   Pulse: 95  Temp:   Resp: 16    Post vital signs: Reviewed and stable  Level of consciousness: sedated  Complications: No apparent anesthesia complications

## 2014-03-11 NOTE — Op Note (Signed)
03/11/2014  1:56 PM  PATIENT:  Candice Reid DOB: 04/20/1941 MRN: [id number]  PREOP DIAGNOSIS:  left breast cancer  POSTOP DIAGNOSIS:   Left breast cancer  , 7 o'clock position (T1, N0)  PROCEDURE:   Procedure(s):  LEFT RADIOACTIVE SEED GUIDED PARTIAL LUMPECTOMY WITH LEFT AXILLARY SENTINEL LYMPH NODE BIOPSY  SURGEON:   Alphonsa Overall, M.D.  FIRST ASSIST:  Leane Para, M.D.  ANESTHESIA:   general  Anesthesiologist: Lillia Abed, MD CRNA: Marrianne Mood, CRNA  General  EBL:  minimal  ml  DRAINS: none   LOCAL MEDICATIONS USED:   30 cc 1/4 % xylocaine  SPECIMEN:   Left breast lumpectomy, left axillary sentinel lymph node   COUNTS CORRECT:  YES  INDICATIONS FOR PROCEDURE:  [name] is a 73 y.o. (DOB: [birth year]) white  female whose primary care physician is Vena Austria, MD and comes for left breast lumpectomy and left axillary sentinel lymph node biopsy.   The options for breast cancer treatment have been discussed with the patient. She elected to proceed with lumpectomy and axillary sentinel lymph node.     The indications and potential complications of surgery were explained to the patient. Potential complications include, but are not limited to, bleeding, infection, the need for further surgery, and nerve injury.      She had a I131 seed placed on 03/07/2014 in her left breast at [facility].  I confirmed placement of the I131 seed in the pre op area using the Neoprobe.  The seed is in the 7 o'clock position of the left breast.   In the holding area, her left areola was injected with 1 millicurie of Technitium Sulfur Colloid.  OPERATIVE NOTE:   The patient was taken to room # 8 at [facility] where she underwent a general anesthesia  supervised by Anesthesiologist: Lillia Abed, MD CRNA: Marrianne Mood, CRNA. Her left breast and axilla were prepped with  ChloraPrep and sterilely draped.    A time-out and the surgical check list was reviewed.     I turned attention to the cancer which was about at the 7 o'clock position of the left breast.   I used the Neoprobe to identify the I131 seed.  I tried to excise an area around the tumor of at least 1 cm.    I excised this block of breast tissue approximately 5 cm by 4 cm  in diameter.   I painted the lumpectomy specimen with the 6 color paint kit and did a specimen mammogram which confirmed the mass, clip, and the seed were all in the right position and the specimen.  This was sent to pathology who called back to confirm that they have the seed.   I then started the left axillary sentinel lymph node biopsy. I made an incision in the left axilla.  I found a hot area at the junction of the breast and the pectoralis major muscle. I cut down and  identified a hot node that had counts of 1200 and the background has 25 counts. I checked her internal mammary nodes and supraclavicular nodes with the neoprobe and found no other hot area. The axillary node was then sent to pathology.    I then irrigated the wound with saline. I infiltrated approximately 30 mL of 1/4% Marcaine between the  Incisions.  I placed 6 clips to mark biopsy cavity, at 12, 3, 6, and 9 o'clock. Two clips were placed on the pectoralis major.   I  then closed all the wounds in layers using 3-0 Vicryl sutures for the deep layer. At the skin, I closed the incisions with a 5-0 Monocryl suture. The incisions were then painted with Dermabond.  She had gauze place over the wounds and placed in a breast binder.   The patient tolerated the procedure well, was transported to the recovery room in good condition. Sponge and needle count were correct at the end of the case.   Final pathology is pending.   Alphonsa Overall, MD, [facility] Pager: [phone] Office phone:  [phone]

## 2014-03-11 NOTE — Transfer of Care (Signed)
Immediate Anesthesia Transfer of Care Note  Patient: Candice Reid  Procedure(s) Performed: Procedure(s): LEFT RADIOACTIVE SEED GUIDED PARTIAL LUMPECTOMY WITH LEFT AXILLARY SENTINEL LYMPH NODE BIOPSY (Left)  Patient Location: PACU  Anesthesia Type:General  Level of Consciousness: awake, alert , oriented and patient cooperative  Airway & Oxygen Therapy: Patient Spontanous Breathing and Patient connected to face mask oxygen  Post-op Assessment: Report given to PACU RN and Post -op Vital signs reviewed and stable  Post vital signs: Reviewed and stable  Complications: No apparent anesthesia complications

## 2014-03-11 NOTE — H&P (View-Only) (Signed)
Re:   Candice Reid DOB:   03/15/1941 MRN:   [id number]  Breast MDC  ASSESSMENT AND PLAN: 1.  Left breast cancer (T1, N0) - 7 o'clock  Path from 02/14/2014 ([id number])- Grade 2 IDC, ER/PR pos, Her2Neu - neg, Ki67 - 10%  Oncology - Magrinat/Wentworh  I discussed the options for breast cancer treatment with the patient.  She is in the multidisciplinary breast clinic which includes medical oncology and radiation oncology.  I discussed the surgical options of lumpectomy vs. mastectomy.  If mastectomy, there is the possibility of reconstruction.   I discussed the options of lymph node biopsy.  The treatment plan depends on the pathologic staging of the tumor and the patient's personal wishes.  The risks of surgery include, but are not limited to, bleeding, infection, the need for further surgery, and nerve injury.  The patient has been given literature on the treatment of breast cancer.  Plan:  1) The patient is very claustrophobic, so we will cancel the MRI, 2) Needle loc (or seed) left lumpectomy and left axillary SLNBx (I discussed both seed and wire loc), 3) no oncotype.  [the patient mentioned breast reduction - look into that as timed with lumpectomy]  2.  Asthma - Seasonal 3.  History of nephrolithiasis - over 15 years ago 4.  HTN x 20 years 5.  Depression/anxiety 6.  Difficulty with legs and walking 7.  Morbid obesity  No chief complaint on file.  REFERRING PHYSICIAN:  Lovey Newcomer, MD.  Radiology  HISTORY OF PRESENT ILLNESS: Candice Reid is a 73 y.o. (DOB: 1941-02-19)  white  female whose primary care physician is Candice Austria, MD and comes to [facility] today for new left breast cancer. Her daughter, Candice Reid, is with her.  She went for her routine mammogram and was found to have an abnormality in the left breast.  She has had no prior breast biopsy.  She took hormones for 20 years, but quit about 5 years ago.  She gives a history of taking a medicine to "dry up her  lactation" after her first child.  She later heard that that medicine may be associated with breast cancer.  She does not know the name of the med.  She has no fam hx of breast cancer.  Mammograms/US on 02/08/2014 showed a 1.0 x 1.0 x 0.8 cm mass at 7 o'clock. 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 Asthma   . Kidney stones   . Arthritis   . Hypertension   . Depression   . Hemorrhoids   . Hyperlipemia   . Anxiety                                                                                                                                                                                                                                                                  .  GERD (gastroesophageal reflux disease)   . Obesity      Past Surgical History  Procedure Laterality Date  . Vaginal hysterectomy  1988  . Knee surgery      x 2  . Breast lumpectomy    . Colonoscopy  03/11/2011    diverticulosis, internal hemorrhoids     Current Outpatient Prescriptions  Medication Sig Dispense Refill  . ALPRAZolam (XANAX) 0.5 MG tablet Take 0.5 mg by mouth. 1/2 as needed       . DULoxetine (CYMBALTA) 60 MG capsule Take 60 mg by mouth daily.      . metoprolol (TOPROL-XL) 100 MG 24 hr tablet Take 100 mg by mouth daily.        . naproxen sodium (ALEVE) 220 MG tablet Take 220 mg by mouth. As needed       . nystatin-triamcinolone (MYCOLOG II) cream       . OMEGA-3 1000 MG CAPS Take 1 capsule by mouth daily.        [name] Kitchen omeprazole (PRILOSEC OTC) 20 MG tablet Take 20 mg by mouth daily.        . pravastatin (PRAVACHOL) 10 MG tablet Take 10 mg by mouth daily.      . rosuvastatin (CRESTOR) 5 MG tablet Take 5 mg by mouth daily.        . sertraline (ZOLOFT) 100 MG tablet Take 150 mg by mouth daily.        [name] Kitchen telmisartan-hydrochlorothiazide (MICARDIS HCT) 80-12.5 MG per tablet  Take 1 tablet by mouth daily.        . valsartan-hydrochlorothiazide (DIOVAN-HCT) 320-25 MG per tablet Take 1 tablet by mouth daily.      [name] Kitchen VITAMIN D, CHOLECALCIFEROL, PO Take 1 tablet by mouth daily.         Current Facility-Administered Medications  Medication Dose Route Frequency Provider Last Rate Last Dose  . 0.9 %  sodium chloride infusion  500 mL Intravenous Continuous Gatha Mayer, MD          Allergies  Allergen Reactions  . Ace Inhibitors   . Morphine And Related   . Oxycodone-Acetaminophen   . Sulfa Antibiotics     REVIEW OF SYSTEMS: Skin:  No history of rash.  No history of abnormal moles. Infection:  No history of hepatitis or HIV.  No history of MRSA. Neurologic:  No history of stroke.  No history of seizure.  No history of headaches. Cardiac:  Hypertension. No history of heart disease.  No history of prior cardiac catheterization.  No history of seeing a cardiologist. Pulmonary:  Seasonal asthma.  Endocrine:  No diabetes. No thyroid disease. Gastrointestinal:  No history of stomach disease.  No history of liver disease.  No history of gall bladder disease.  No history of pancreas disease.  No history of colon disease. Urologic: History of nephrolithiasis GYN:  Sees Candice Reid.  Had hysterectomy about 1990. Musculoskeletal:  Left knee arthroscopy.  Can't straighten knees and uses cane to walk any distance.. Hematologic:  No bleeding disorder.  No history of anemia.  Not anticoagulated. Psycho-social:  The patient is oriented.   The patient has no obvious psychologic or social impairment to understanding our conversation and plan.  SOCIAL and FAMILY HISTORY: Widowed. Lives by self. Has daughter, Candice Reid, and son, Candice Reid.  PHYSICAL EXAM: There were no vitals taken for this visit.  General: WN obese WF who is alert.[name] Kitchen  HEENT: Normal. Pupils equal. Neck: Supple. No mass.  No thyroid mass. Lymph Nodes:  No supraclavicular or cervical nodes. Lungs: Clear to  auscultation and symmetric breath sounds. Heart:  RRR. No murmur or rub. Breast:  Right - Very large.  No mass.  Left - Very large.  Bruise at 7 o'clock.  Abdomen: Soft. No mass. No tenderness. No hernia. Normal bowel sounds.  Pfannenstiel scar. Rectal: Not done. Extremities:  Can't straighten lower extremities and some trouble walking. Neurologic:  Grossly intact to motor and sensory function. Psychiatric: Has normal mood and affect. Behavior is normal.   DATA REVIEWED: Epic notes.  Alphonsa Overall, MD,  [facility], [address]  [place], [place]    [place] Phone:  [phone] FAX:  [phone]

## 2014-03-11 NOTE — Anesthesia Procedure Notes (Signed)
Procedure Name: LMA Insertion Date/Time: 03/11/2014 12:34 PM Performed by: Melynda Ripple D Pre-anesthesia Checklist: Patient identified, Emergency Drugs available, Suction available and Patient being monitored Patient Re-evaluated:Patient Re-evaluated prior to inductionOxygen Delivery Method: Circle System Utilized Preoxygenation: Pre-oxygenation with 100% oxygen Intubation Type: IV induction Ventilation: Mask ventilation without difficulty LMA: LMA inserted LMA Size: 4.0 Number of attempts: 1 Airway Equipment and Method: bite block Placement Confirmation: positive ETCO2 Tube secured with: Tape Dental Injury: Teeth and Oropharynx as per pre-operative assessment

## 2014-03-18 ENCOUNTER — Telehealth (INDEPENDENT_AMBULATORY_CARE_PROVIDER_SITE_OTHER): Payer: Self-pay

## 2014-03-18 NOTE — Telephone Encounter (Signed)
Pt s/p lumpectomy on 03/11/14. Pt states that due to her large breast she has started breaking out in a large rash. Advised pt that she can place dry guaze under the breast to help. Pt states that she has done this with no relief. Advised pt that she can also place hydrocortisone cream and place the guaze on top of that. Informed pt to make sure that the cream does not get into the incision area.  Pt denies any redness or swelling at the incision site. Pt verbalized understanding.

## 2014-03-25 NOTE — Progress Notes (Signed)
Location of Breast Cancer:Left Breast lower-inner quadrant 7 o'clock position reveals grade 2 and DCIS  Histology per Pathology Report:  03/11/2014 Diagnosis 1. Breast, lumpectomy, left - INVASIVE DUCTAL CARCINOMA, GRADE II/III, SPANNING 1.5 CM. - DUCTAL CARCINOMA IN SITU, INTERMEDIATE GRADE. - THE SURGICAL RESECTION MARGINS ARE NEGATIVE FOR DUCTAL CARCINOMA. - SEE ONCOLOGY TABLE BELOW. 2. Lymph node, sentinel, biopsy - THERE IS NO EVIDENCE OF CARCINOMA IN 1 OF 1 LYMPH NODE (0/1).  02/14/2014 Diagnosis Breast, left, needle core biopsy, 7 o'clock - INVASIVE DUCTAL CARCINOMA. - DUCTAL CARCINO  Receptor Status: ER(+), PR (+), Her2-neu (-)  Did patient present with symptoms (if so, please note symptoms) or was this found on screening mammography?: Found on screening mammogram as 1 cm mass of left breast.  Past/Anticipated interventions by surgeon, if LTY:VDPBAQVOH: Procedure(s): LEFT RADIOACTIVE SEED GUIDED PARTIAL LUMPECTOMY WITH LEFT Wise on 03/11/2014 by Paralee Cancel  Past/Anticipated interventions by medical oncology, if any: Chemotherapy not reccommended.Antiestrogen therapy (anastozole) post radiation treatment if required.  Lymphedema issues, if any:  no  Pain issues, if any:   Post op soreness, intermittent  SAFETY ISSUES:  Prior radiation? No  Pacemaker/ICD? No  Possible current pregnancy? No, vaginal hysterectomy in 1988.  Is the patient on methotrexate? No  Current Complaints / other details: Menses age 73,GXP2, first live birth age 39, took hormonal replacement therapy for approximately 5 years, quit 20 years ago. 3 grandchildren. Worked part time for a florist. No smoking history. Allergies: ace inhibitors, morphine, oxycodone-acetaminophen and sulfa derivatives.   Arlyss Repress, RN 03/25/2014,11:44 AM

## 2014-03-27 ENCOUNTER — Encounter: Payer: Self-pay | Admitting: Radiation Oncology

## 2014-03-27 ENCOUNTER — Ambulatory Visit
Admission: RE | Admit: 2014-03-27 | Discharge: 2014-03-27 | Disposition: A | Discharge status: Home / Self Care | Payer: Medicare Other | Source: Ambulatory Visit | Attending: Radiation Oncology | Admitting: Radiation Oncology

## 2014-03-27 VITALS — BP 144/72 | HR 87 | Temp 98.1°F | Resp 20 | Ht 63.5 in | Wt 262.0 lb

## 2014-03-27 DIAGNOSIS — C50312 Malignant neoplasm of lower-inner quadrant of left female breast: Secondary | ICD-10-CM

## 2014-03-27 DIAGNOSIS — Z510 Encounter for antineoplastic radiation therapy: Secondary | ICD-10-CM | POA: Insufficient documentation

## 2014-03-27 DIAGNOSIS — Z170 Estrogen receptor positive status [ER+]: Secondary | ICD-10-CM | POA: Insufficient documentation

## 2014-03-27 DIAGNOSIS — L538 Other specified erythematous conditions: Secondary | ICD-10-CM | POA: Diagnosis not present

## 2014-03-27 DIAGNOSIS — Z79899 Other long term (current) drug therapy: Secondary | ICD-10-CM | POA: Diagnosis not present

## 2014-03-27 DIAGNOSIS — C50319 Malignant neoplasm of lower-inner quadrant of unspecified female breast: Secondary | ICD-10-CM | POA: Diagnosis not present

## 2014-03-27 HISTORY — DX: Malignant neoplasm of unspecified site of unspecified female breast: C50.919

## 2014-03-27 NOTE — Progress Notes (Signed)
Please see the Nurse Progress Note in the MD Initial Consult Encounter for this patient. 

## 2014-03-28 ENCOUNTER — Encounter (INDEPENDENT_AMBULATORY_CARE_PROVIDER_SITE_OTHER): Payer: Self-pay | Admitting: Surgery

## 2014-03-28 ENCOUNTER — Ambulatory Visit (INDEPENDENT_AMBULATORY_CARE_PROVIDER_SITE_OTHER): Payer: Medicare Other | Admitting: Surgery

## 2014-03-28 VITALS — BP 124/80 | HR 78 | Temp 98.0°F | Ht 64.0 in | Wt 262.0 lb

## 2014-03-28 DIAGNOSIS — C50312 Malignant neoplasm of lower-inner quadrant of left female breast: Secondary | ICD-10-CM

## 2014-03-28 DIAGNOSIS — C50319 Malignant neoplasm of lower-inner quadrant of unspecified female breast: Secondary | ICD-10-CM

## 2014-03-28 NOTE — Progress Notes (Addendum)
   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  She recovered well from her surgery. She had negative margins and one negative lymph node. She was ER+ PR+ HER2 negative. She had minimal pain. She is seeing Dr. Lucia Gaskins tomorrow. She has intertrigo under the breast for which she is using lamictal with minimal relief.   Allergies:  Allergies  Allergen Reactions  . Ace Inhibitors Cough  . Metronidazole Nausea Only  . Morphine And Related Itching  . Oxycodone-Acetaminophen Nausea Only  . Septra [Sulfamethoxazole-Trimethoprim] Nausea Only  . Sulfa Antibiotics Nausea Only  . Valium [Diazepam] Other (See Comments)    depression    Medications:  Current Outpatient Prescriptions  Medication Sig Dispense Refill  . ALPRAZolam (XANAX) 0.5 MG tablet Take 0.5 mg by mouth. 1/2 as needed       . DULoxetine (CYMBALTA) 60 MG capsule Take 60 mg by mouth daily.      . meloxicam (MOBIC) 15 MG tablet Take 15 mg by mouth daily.      . metoprolol (TOPROL-XL) 100 MG 24 hr tablet Take 100 mg by mouth daily.        . OMEGA-3 1000 MG CAPS Take 1 capsule by mouth daily.        [name] Kitchen omeprazole (PRILOSEC OTC) 20 MG tablet Take 20 mg by mouth daily.        . pravastatin (PRAVACHOL) 10 MG tablet Take 10 mg by mouth once a week.       . valsartan-hydrochlorothiazide (DIOVAN-HCT) 320-25 MG per tablet Take 1 tablet by mouth daily.      [name] Kitchen VITAMIN D, CHOLECALCIFEROL, PO Take 1 tablet by mouth daily.         No current facility-administered medications for this encounter.    Physical Exam:  Filed Vitals:   03/27/14 1420  BP: 144/72  Pulse: 87  Temp: 98.1 F (36.7 C)  Resp: 20  Height: 5' 3.5" (1.613 m)  Weight: 262 lb (118.842 kg)   intertrigo  bilaterally. Scar is healing well.   IMPRESSION: Candice Reid is a 73 y.o. female s/p lumpectomy with negative margins.   PLAN:  I spoke to the patient today regarding her diagnosis and options for treatment. We discussed the equivalence in terms of survival between lumpectomy plsu antiestrogen and lumpectomy plus antiestrogen and radiation. We discussed the low likelihood of her experience metastatic disease in her lifetime. We discussed the difference between local and systemic treatment. We discussed the role of radiation in decreasing local failures in patients who undergo lumpectomy. We discussed the process of simulation and the placement tattoos. We discussed 5 weeks of treatment as an outpatient in the prone position to decrease skin reaction in the inframammary folds given her large breast size. We discussed the possibility of asymptomatic lung damage. We discussed the low likelihood of secondary malignancies. We discussed the possible side effects including but not limited to skin redness, fatigue, permanent skin darkening, and breast swelling. Sh would like to proceed with radiation and then try antiestrogen treatments.  I gave her some micanozole powder to try for the rash and some non adherent dressings. She has follow up scheduled with medical oncology in September.     Thea Silversmith, MD

## 2014-03-28 NOTE — Progress Notes (Signed)
Re:   Candice Reid DOB:   1941-09-19 MRN:   [id number]  Breast MDC  ASSESSMENT AND PLAN: 1.  Left breast cancer (T1c, N0) - 7 o'clock  Path from 02/14/2014 [phone])- Grade 2 IDC, ER/PR pos, Her2Neu - neg, Ki67 - 10%  Left breast lumpectomy, left axillary SLNBx - 03/11/2014  Final path ([id number]) - 03/11/2014 - IDC, grade 2/3, 1.5 cm, 0/1 nodes  Oncology - Magrinat/Wentworh  She saw Dr. Pablo Ledger yesterday.  She is going to undergo radiation tx.  She sees Dr. Jana Hakim in September.  I'll see her back in 6 months.  2.  Asthma - Seasonal 3.  History of nephrolithiasis - over 15 years ago 4.  HTN x 20 years 5.  Depression/anxiety 6.  Difficulty with legs and walking 7.  Morbid obesity - BMI - 45.  Chief Complaint  Patient presents with  . Breast Cancer Long Term Follow Up   REFERRING PHYSICIAN:  Lovey Newcomer, MD.  Radiology  HISTORY OF PRESENT ILLNESS: Candice Reid is a 73 y.o. (DOB: 08/10/1941)  white  female whose primary care physician is Vena Austria, MD and comes to [facility] today for new left breast cancer. She comes by herself. She is doing well.  She has a rash under both breast, the left side worse than the right side. She asked some questions about radiation and the anti hormone pill. She is in good spirits.  History of breast cancer (01/2014): She went for her routine mammogram and was found to have an abnormality in the left breast.  She has had no prior breast biopsy.  She took hormones for 20 years, but quit about 5 years ago.  She gives a history of taking a medicine to "dry up her lactation" after her first child.  She later heard that that medicine may be associated with breast cancer.  She does not know the name of the med.  She has no fam hx of breast cancer.  Mammograms/US on 02/08/2014 showed a 1.0 x 1.0 x 0.8 cm mass at 7 o'clock. US guided biopsy ([facility]) on 02/14/2014 Path from 02/14/2014 [id number])- Grade 2 IDC, ER/PR pos, Her2Neu  - neg, Ki67 - 10%   Past Medical History  Diagnosis Date  . Asthma   . Kidney stones   . Arthritis   . Hypertension   . Depression   . Hemorrhoids   . Hyperlipemia   . Anxiety                                                                                                                                                                                                                                                                  .  GERD (gastroesophageal reflux disease)   . Obesity   . Hot flashes   . Phobia   . Complication of anesthesia     shaky-hard to wake up  . Wears glasses     reading  . Dental bridge present   . Breast cancer      Past Surgical History  Procedure Laterality Date  . Knee surgery      x 2-left  . Colonoscopy  03/11/2011    diverticulosis, internal hemorrhoids  . Breast lumpectomy  1985    left-neg  . Dilation and curettage of uterus    . Cystoscopy w/ ureteral stent placement      removed  . Vaginal hysterectomy  1988    endometriosis, fibroids     Current Outpatient Prescriptions  Medication Sig Dispense Refill  . ALPRAZolam (XANAX) 0.5 MG tablet Take 0.5 mg by mouth. 1/2 as needed       . DULoxetine (CYMBALTA) 60 MG capsule Take 60 mg by mouth daily.      . meloxicam (MOBIC) 15 MG tablet Take 15 mg by mouth daily.      . metoprolol (TOPROL-XL) 100 MG 24 hr tablet Take 100 mg by mouth daily.        . OMEGA-3 1000 MG CAPS Take 1 capsule by mouth daily.        [name] Kitchen omeprazole (PRILOSEC OTC) 20 MG tablet Take 20 mg by mouth daily.        . pravastatin (PRAVACHOL) 10 MG tablet Take 10 mg by mouth once a week.       . valsartan-hydrochlorothiazide (DIOVAN-HCT) 320-25 MG per tablet Take 1 tablet by mouth daily.      [name] Kitchen VITAMIN D, CHOLECALCIFEROL, PO Take 1 tablet by mouth daily.         No current facility-administered medications for this visit.      Allergies  Allergen Reactions  . Ace Inhibitors Cough  . Metronidazole Nausea Only  . Morphine  And Related Itching  . Oxycodone-Acetaminophen Nausea Only  . Septra [Sulfamethoxazole-Trimethoprim] Nausea Only  . Sulfa Antibiotics Nausea Only  . Valium [Diazepam] Other (See Comments)    depression    REVIEW OF SYSTEMS: Cardiac:  Hypertension. No history of heart disease.  No history of prior cardiac catheterization.  No history of seeing a cardiologist. Pulmonary:  Seasonal asthma. Gastrointestinal:  No history of stomach disease.  No history of liver disease.  No history of gall bladder disease.  No history of pancreas disease.  No history of colon disease. Urologic: History of nephrolithiasis GYN:  Sees Dr. Susette Racer.  Had hysterectomy about 1990. Musculoskeletal:  Left knee arthroscopy.  Can't straighten knees and uses cane to walk any distance..  SOCIAL and FAMILY HISTORY: Widowed. Lives by self. Has daughter, [name], and son, [name].  PHYSICAL EXAM: BP 124/80  Pulse 78  Temp(Src) 98 F (36.7 C)  Ht 5\' 4"  (1.626 m)  Wt 262 lb (118.842 kg)  BMI 44.95 kg/m2  General: WN obese WF who is alert.[name] Kitchen  HEENT: Normal. Pupils equal. Neck: Supple. No mass.  No thyroid mass. Lymph Nodes:  No supraclavicular or cervical nodes. Breast:  Right - Very large.  No mass.  Left -  6 o'clock incision and left axillary incision look good.  Rash under breast, worse on the left side.  DATA REVIEWED: Path report to patient.  Alphonsa Overall, MD,  [facility], [address]  [place], [place]  [address] Phone:  [phone] FAX:  [phone]

## 2014-04-04 ENCOUNTER — Ambulatory Visit
Admission: RE | Admit: 2014-04-04 | Discharge: 2014-04-04 | Disposition: A | Discharge status: Home / Self Care | Payer: Medicare Other | Source: Ambulatory Visit | Attending: Radiation Oncology | Admitting: Radiation Oncology

## 2014-04-04 DIAGNOSIS — C50312 Malignant neoplasm of lower-inner quadrant of left female breast: Secondary | ICD-10-CM

## 2014-04-04 DIAGNOSIS — Z510 Encounter for antineoplastic radiation therapy: Secondary | ICD-10-CM | POA: Diagnosis not present

## 2014-04-04 MED ORDER — FLUCONAZOLE 100 MG PO TABS: ORAL_TABLET | ORAL | Status: DC

## 2014-04-04 NOTE — Progress Notes (Signed)
Name: Candice Reid   MRN: [id number]  Date:  04/04/2014  DOB: [birth year]  Status:outpatient   DIAGNOSIS: Left Breast cancer.  CONSENT VERIFIED: yes  SET UP: Patient is setup prone.   IMMOBILIZATION:  The following immobilization was used:Custom Moldable Pillow, breast board.  NARRATIVE: Ms. Farrington was brought to the [facility].  Identity was confirmed.  All relevant records and images related to the planned course of therapy were reviewed.  Then, the patient was positioned in a stable reproducible clinical set-up for radiation therapy.  Wires were placed to delineate the clinical extent of breast tissue. A wire was placed on the scar as well.  CT images were obtained.  An isocenter was placed. Skin markings were placed. The CT images were loaded into the planning software where the target and avoidance structures were contoured.  The radiation prescription was entered and confirmed. The patient was discharged in stable condition and tolerated simulation well.    TREATMENT PLANNING NOTE/3D Simulation Note Treatment planning then occurred. I have requested : MLC's, isodose plan, basic dose calculation  3D simulation was performed.  I personally designed and supervised the construction of 3 medically necessary complex treatment devices in the form of MLCs which will be used for beam modification and to protect critical structures including the heart and lung as well as the immobilization device which is necessary for reproducible set up.  I have requested a dose volume histogram of the heart, lung and tumor cavity.

## 2014-04-08 ENCOUNTER — Telehealth: Payer: Self-pay

## 2014-04-08 ENCOUNTER — Encounter: Payer: Self-pay | Admitting: Radiation Oncology

## 2014-04-08 NOTE — Telephone Encounter (Signed)
Responded to patients questions regarding schedule via my chart.Informed patient to be here 04/12/14 at 11:30 to register for 11:55 am appointment and that we will give her a calendar printout of correct times for treatment.

## 2014-04-10 DIAGNOSIS — Z510 Encounter for antineoplastic radiation therapy: Secondary | ICD-10-CM | POA: Diagnosis not present

## 2014-04-11 DIAGNOSIS — Z510 Encounter for antineoplastic radiation therapy: Secondary | ICD-10-CM | POA: Diagnosis not present

## 2014-04-12 ENCOUNTER — Ambulatory Visit
Admission: RE | Admit: 2014-04-12 | Discharge: 2014-04-12 | Disposition: A | Discharge status: Home / Self Care | Payer: Medicare Other | Source: Ambulatory Visit | Attending: Radiation Oncology | Admitting: Radiation Oncology

## 2014-04-12 DIAGNOSIS — Z510 Encounter for antineoplastic radiation therapy: Secondary | ICD-10-CM | POA: Diagnosis not present

## 2014-04-15 ENCOUNTER — Ambulatory Visit
Admission: RE | Admit: 2014-04-15 | Discharge: 2014-04-15 | Disposition: A | Discharge status: Home / Self Care | Payer: Medicare Other | Source: Ambulatory Visit | Attending: Radiation Oncology | Admitting: Radiation Oncology

## 2014-04-15 ENCOUNTER — Ambulatory Visit: Payer: Medicare Other | Admitting: Radiation Oncology

## 2014-04-15 ENCOUNTER — Ambulatory Visit
Admission: RE | Admit: 2014-04-15 | Payer: Medicare Other | Source: Ambulatory Visit | Attending: Radiation Oncology | Admitting: Radiation Oncology

## 2014-04-15 DIAGNOSIS — C50312 Malignant neoplasm of lower-inner quadrant of left female breast: Secondary | ICD-10-CM

## 2014-04-15 DIAGNOSIS — Z510 Encounter for antineoplastic radiation therapy: Secondary | ICD-10-CM | POA: Diagnosis not present

## 2014-04-15 MED ORDER — RADIAPLEXRX EX GEL
Freq: Once | CUTANEOUS | Status: AC
  Administered 2014-04-15: 12:00:00 via TOPICAL

## 2014-04-15 MED ORDER — ALRA NON-METALLIC DEODORANT (RAD-ONC)
1.0000 "application " | Freq: Once | TOPICAL | Status: AC
  Administered 2014-04-15: 1 via TOPICAL

## 2014-04-15 NOTE — Progress Notes (Signed)
Routine of clinic reviewed.Explained weekly doctor visit is every Tuesday after radiation.Given Radiation Therapy and You Booklet, skin care sheet, alra deodorant and radiaplex.Reviewed possible side effects to include skin discoloration/peeling, swelling or tenderness of breast, and fatigue.To push oral fluids to maintain hydration and high protein diet.

## 2014-04-16 ENCOUNTER — Ambulatory Visit
Admission: RE | Admit: 2014-04-16 | Discharge: 2014-04-16 | Disposition: A | Discharge status: Home / Self Care | Payer: Medicare Other | Source: Ambulatory Visit | Attending: Radiation Oncology | Admitting: Radiation Oncology

## 2014-04-16 ENCOUNTER — Ambulatory Visit: Payer: Medicare Other | Admitting: Radiation Oncology

## 2014-04-16 ENCOUNTER — Encounter: Payer: Self-pay | Admitting: Radiation Oncology

## 2014-04-16 VITALS — BP 128/74 | HR 85 | Temp 98.1°F | Ht 64.0 in | Wt 266.4 lb

## 2014-04-16 DIAGNOSIS — C50312 Malignant neoplasm of lower-inner quadrant of left female breast: Secondary | ICD-10-CM

## 2014-04-16 DIAGNOSIS — Z510 Encounter for antineoplastic radiation therapy: Secondary | ICD-10-CM | POA: Diagnosis not present

## 2014-04-16 MED ORDER — FLUCONAZOLE 100 MG PO TABS: ORAL_TABLET | ORAL | Status: DC

## 2014-04-16 NOTE — Progress Notes (Addendum)
Candice Reid has received 2 fractions to her left breast.  She continues to use antifungal powder in the bilateral inframmary folds and note redness in these areas.  Has new bruises on her right and left upper abdomen, worse on the left and Candice Reid thinks it is from the pressure when lying prone.   Anterior breast without any changes at this time.

## 2014-04-16 NOTE — Progress Notes (Signed)
Weekly Management Note Current Dose: 4  Gy  Projected Dose:50  Gy   Narrative:  The patient presents for routine under treatment assessment.  CBCT/MVCT images/Port film x-rays were reviewed.  The chart was checked. Still has intertrigo in inframammary folds. Somewhat better with diflucan but back now. Still using antifugal powder. Sore form being on table so long for films.   Physical Findings: Weight: 266 lb 6.4 oz (120.838 kg). Redness in inframammary folds. Alert adn oriented.   Impression:  The patient is tolerating radiation.  Plan:  Continue treatment as planned. Redo difulcan x 2 weeks if no improvement consider dermatology referral.

## 2014-04-17 ENCOUNTER — Ambulatory Visit
Admission: RE | Admit: 2014-04-17 | Discharge: 2014-04-17 | Disposition: A | Discharge status: Home / Self Care | Payer: Medicare Other | Source: Ambulatory Visit | Attending: Radiation Oncology | Admitting: Radiation Oncology

## 2014-04-17 DIAGNOSIS — Z510 Encounter for antineoplastic radiation therapy: Secondary | ICD-10-CM | POA: Diagnosis not present

## 2014-04-18 ENCOUNTER — Ambulatory Visit
Admission: RE | Admit: 2014-04-18 | Discharge: 2014-04-18 | Disposition: A | Discharge status: Home / Self Care | Payer: Medicare Other | Source: Ambulatory Visit | Attending: Radiation Oncology | Admitting: Radiation Oncology

## 2014-04-18 DIAGNOSIS — Z510 Encounter for antineoplastic radiation therapy: Secondary | ICD-10-CM | POA: Diagnosis not present

## 2014-04-19 ENCOUNTER — Ambulatory Visit
Admission: RE | Admit: 2014-04-19 | Discharge: 2014-04-19 | Disposition: A | Discharge status: Home / Self Care | Payer: Medicare Other | Source: Ambulatory Visit | Attending: Radiation Oncology | Admitting: Radiation Oncology

## 2014-04-19 DIAGNOSIS — Z510 Encounter for antineoplastic radiation therapy: Secondary | ICD-10-CM | POA: Diagnosis not present

## 2014-04-22 ENCOUNTER — Ambulatory Visit
Admission: RE | Admit: 2014-04-22 | Discharge: 2014-04-22 | Disposition: A | Discharge status: Home / Self Care | Payer: Medicare Other | Source: Ambulatory Visit | Attending: Radiation Oncology | Admitting: Radiation Oncology

## 2014-04-22 DIAGNOSIS — Z510 Encounter for antineoplastic radiation therapy: Secondary | ICD-10-CM | POA: Diagnosis not present

## 2014-04-23 ENCOUNTER — Ambulatory Visit
Admission: RE | Admit: 2014-04-23 | Discharge: 2014-04-23 | Disposition: A | Discharge status: Home / Self Care | Payer: Medicare Other | Source: Ambulatory Visit | Attending: Radiation Oncology | Admitting: Radiation Oncology

## 2014-04-23 VITALS — BP 142/62 | HR 80 | Temp 97.9°F | Wt 265.8 lb

## 2014-04-23 DIAGNOSIS — C50312 Malignant neoplasm of lower-inner quadrant of left female breast: Secondary | ICD-10-CM

## 2014-04-23 DIAGNOSIS — Z510 Encounter for antineoplastic radiation therapy: Secondary | ICD-10-CM | POA: Diagnosis not present

## 2014-04-23 NOTE — Progress Notes (Signed)
Weekly assessment of radiation to left breast.Rash still remains.as of 04/16/14 patient to redo diflucan x 2 weeks and consider dermatology referral if no improvement.Denies pain.Mild skin changes.

## 2014-04-23 NOTE — Progress Notes (Signed)
  Radiation Oncology         [phone] ________________________________  Name: Candice Reid MRN: [id number]  Date: 04/23/2014  DOB: 1941-06-05  Weekly Radiation Therapy Management  Cancer of lower-inner quadrant of left female breast   Primary site: Breast (Left)   Staging method: AJCC 7th Edition   Clinical: Stage IA (T1b, N0, cM0)   Summary: Stage IA (T1b, N0, cM0)   Clinical comments: Staged at breast conference 02/20/14.   Current Dose: 14 Gy     Planned Dose:  50 Gy  Narrative . . . . . . . . The patient presents for routine under treatment assessment.                                   The patient is without complaint. She has a rash in the left inframammary fold which is felt to be fungal in nature. Patient is on medication for this issue.                                 Set-up films were reviewed.                                 The chart was checked. Physical Findings. . .  weight is 265 lb 12.8 oz (120.566 kg). Her temperature is 97.9 F (36.6 C). Her blood pressure is 142/62 and her pulse is 80.  the lungs are clear. The heart has a regular rhythm and rate. Examination of the left breast reveals some mild erythema. The inframammary fold area shows significant erythema without any skin breakdown. Impression . . . . . . . The patient is tolerating radiation. Plan . . . . . . . . . . . . Continue treatment as planned.  ________________________________   Blair Promise, PhD, MD

## 2014-04-24 ENCOUNTER — Ambulatory Visit
Admission: RE | Admit: 2014-04-24 | Discharge: 2014-04-24 | Disposition: A | Discharge status: Home / Self Care | Payer: Medicare Other | Source: Ambulatory Visit | Attending: Radiation Oncology | Admitting: Radiation Oncology

## 2014-04-24 DIAGNOSIS — Z510 Encounter for antineoplastic radiation therapy: Secondary | ICD-10-CM | POA: Diagnosis not present

## 2014-04-25 ENCOUNTER — Ambulatory Visit
Admission: RE | Admit: 2014-04-25 | Discharge: 2014-04-25 | Disposition: A | Discharge status: Home / Self Care | Payer: Medicare Other | Source: Ambulatory Visit | Attending: Radiation Oncology | Admitting: Radiation Oncology

## 2014-04-25 DIAGNOSIS — Z510 Encounter for antineoplastic radiation therapy: Secondary | ICD-10-CM | POA: Diagnosis not present

## 2014-04-26 ENCOUNTER — Ambulatory Visit
Admission: RE | Admit: 2014-04-26 | Discharge: 2014-04-26 | Disposition: A | Discharge status: Home / Self Care | Payer: Medicare Other | Source: Ambulatory Visit | Attending: Radiation Oncology | Admitting: Radiation Oncology

## 2014-04-26 DIAGNOSIS — Z510 Encounter for antineoplastic radiation therapy: Secondary | ICD-10-CM | POA: Diagnosis not present

## 2014-04-29 ENCOUNTER — Ambulatory Visit
Admission: RE | Admit: 2014-04-29 | Discharge: 2014-04-29 | Disposition: A | Discharge status: Home / Self Care | Payer: Medicare Other | Source: Ambulatory Visit | Attending: Radiation Oncology | Admitting: Radiation Oncology

## 2014-04-29 DIAGNOSIS — Z510 Encounter for antineoplastic radiation therapy: Secondary | ICD-10-CM | POA: Diagnosis not present

## 2014-04-30 ENCOUNTER — Encounter: Payer: Self-pay | Admitting: Radiation Oncology

## 2014-04-30 ENCOUNTER — Ambulatory Visit
Admission: RE | Admit: 2014-04-30 | Discharge: 2014-04-30 | Disposition: A | Discharge status: Home / Self Care | Payer: Medicare Other | Source: Ambulatory Visit | Attending: Radiation Oncology | Admitting: Radiation Oncology

## 2014-04-30 VITALS — BP 127/79 | HR 84 | Temp 97.5°F | Resp 16 | Wt 264.9 lb

## 2014-04-30 DIAGNOSIS — Z510 Encounter for antineoplastic radiation therapy: Secondary | ICD-10-CM | POA: Diagnosis not present

## 2014-04-30 DIAGNOSIS — C50312 Malignant neoplasm of lower-inner quadrant of left female breast: Secondary | ICD-10-CM

## 2014-04-30 NOTE — Progress Notes (Signed)
Weekly Management Note Current Dose: 24  Gy  Projected Dose: 50 Gy   Narrative:  The patient presents for routine under treatment assessment.  CBCT/MVCT images/Port film x-rays were reviewed.  The chart was checked. Doing well. Rash under left breast has cleared 90%. Rash under right breast continues.  Almost areas of skin breakdown now. Painful.   Physical Findings: Weight: 264 lb 14.4 oz (120.158 kg). Intertrigo right >> than left.   Impression:  The patient is tolerating radiation.  Plan:  Continue treatment as planned. Continue micanozle (after treatment) and radiaplex.

## 2014-04-30 NOTE — Progress Notes (Signed)
Weekly rad tx left breast 12/25 completed, erythema on top of breast mild, under inframmary fold,skin starting to break, bright erythema there, using rdiaplex on  Breast and miconazole powder inframmary fold, appetite good, energy level not good, moderate fatigue, hot flashes and chills alternately,breast tender 1:35 PM

## 2014-05-01 ENCOUNTER — Ambulatory Visit
Admission: RE | Admit: 2014-05-01 | Discharge: 2014-05-01 | Disposition: A | Discharge status: Home / Self Care | Payer: Medicare Other | Source: Ambulatory Visit | Attending: Radiation Oncology | Admitting: Radiation Oncology

## 2014-05-01 DIAGNOSIS — Z510 Encounter for antineoplastic radiation therapy: Secondary | ICD-10-CM | POA: Diagnosis not present

## 2014-05-02 ENCOUNTER — Ambulatory Visit
Admission: RE | Admit: 2014-05-02 | Discharge: 2014-05-02 | Disposition: A | Discharge status: Home / Self Care | Payer: Medicare Other | Source: Ambulatory Visit | Attending: Radiation Oncology | Admitting: Radiation Oncology

## 2014-05-02 DIAGNOSIS — Z510 Encounter for antineoplastic radiation therapy: Secondary | ICD-10-CM | POA: Diagnosis not present

## 2014-05-03 ENCOUNTER — Ambulatory Visit
Admission: RE | Admit: 2014-05-03 | Discharge: 2014-05-03 | Disposition: A | Discharge status: Home / Self Care | Payer: Medicare Other | Source: Ambulatory Visit | Attending: Radiation Oncology | Admitting: Radiation Oncology

## 2014-05-03 DIAGNOSIS — Z510 Encounter for antineoplastic radiation therapy: Secondary | ICD-10-CM | POA: Diagnosis not present

## 2014-05-06 ENCOUNTER — Ambulatory Visit
Admission: RE | Admit: 2014-05-06 | Discharge: 2014-05-06 | Disposition: A | Discharge status: Home / Self Care | Payer: Medicare Other | Source: Ambulatory Visit | Attending: Radiation Oncology | Admitting: Radiation Oncology

## 2014-05-06 DIAGNOSIS — Z510 Encounter for antineoplastic radiation therapy: Secondary | ICD-10-CM | POA: Diagnosis not present

## 2014-05-07 ENCOUNTER — Ambulatory Visit
Admission: RE | Admit: 2014-05-07 | Discharge: 2014-05-07 | Disposition: A | Discharge status: Home / Self Care | Payer: Medicare Other | Source: Ambulatory Visit | Attending: Radiation Oncology | Admitting: Radiation Oncology

## 2014-05-07 ENCOUNTER — Encounter: Payer: Self-pay | Admitting: Radiation Oncology

## 2014-05-07 VITALS — BP 129/67 | HR 79 | Temp 98.0°F | Resp 20 | Wt 263.3 lb

## 2014-05-07 DIAGNOSIS — Z510 Encounter for antineoplastic radiation therapy: Secondary | ICD-10-CM | POA: Diagnosis not present

## 2014-05-07 DIAGNOSIS — C50312 Malignant neoplasm of lower-inner quadrant of left female breast: Secondary | ICD-10-CM

## 2014-05-07 NOTE — Progress Notes (Signed)
Weekly Management Note Current Dose: 34  Gy  Projected Dose: 50 Gy   Narrative:  The patient presents for routine under treatment assessment.  CBCT/MVCT images/Port film x-rays were reviewed.  The chart was checked. Skin under left breast is still irritated. Stick and sore. Radiaplex makes it feel beter. Right inframammary fold is clear.   Physical Findings: Weight: 263 lb 4.8 oz (119.432 kg). Moist desquamation with intertrigo changes in the inframammary fold of the left breast.   Impression:  The patient is tolerating radiation.  Plan:  Continue treatment as planned. Try genentian violet tomorrow. Continue radiaplex.

## 2014-05-07 NOTE — Progress Notes (Addendum)
Patient reports soreness, pain under her left breast. She has hyperpigmentation of left breast, especially underneath the breast,  no breakdown noted, area very moist. She has been applying Radiaplex to this area, has completed 2 courses of Fluconazole po. She states she has been applying Miconazole powder under her right breast but not under her left breast. Pt will discuss further treatment with Dr Pablo Ledger today. Will assist as needed. Patient is fatigued.

## 2014-05-08 ENCOUNTER — Ambulatory Visit
Admission: RE | Admit: 2014-05-08 | Discharge: 2014-05-08 | Disposition: A | Discharge status: Home / Self Care | Payer: Medicare Other | Source: Ambulatory Visit | Attending: Radiation Oncology | Admitting: Radiation Oncology

## 2014-05-08 DIAGNOSIS — Z510 Encounter for antineoplastic radiation therapy: Secondary | ICD-10-CM | POA: Diagnosis not present

## 2014-05-08 DIAGNOSIS — C50312 Malignant neoplasm of lower-inner quadrant of left female breast: Secondary | ICD-10-CM

## 2014-05-08 MED ORDER — RADIAPLEXRX EX GEL
Freq: Once | CUTANEOUS | Status: AC
  Administered 2014-05-08: 14:00:00 via TOPICAL

## 2014-05-08 NOTE — Progress Notes (Signed)
Applied genentian violet to moist desquamated skin in the left inframammary fold. Patient tolerated well. Allowed solution to dry. Explained to the patient it could stain clothing, to show with her back to the running water and to continue to use her radiaplex gel. Patient verbalized understanding.

## 2014-05-09 ENCOUNTER — Ambulatory Visit
Admission: RE | Admit: 2014-05-09 | Discharge: 2014-05-09 | Disposition: A | Discharge status: Home / Self Care | Payer: Medicare Other | Source: Ambulatory Visit | Attending: Radiation Oncology | Admitting: Radiation Oncology

## 2014-05-09 DIAGNOSIS — Z510 Encounter for antineoplastic radiation therapy: Secondary | ICD-10-CM | POA: Diagnosis not present

## 2014-05-10 ENCOUNTER — Ambulatory Visit
Admission: RE | Admit: 2014-05-10 | Discharge: 2014-05-10 | Disposition: A | Discharge status: Home / Self Care | Payer: Medicare Other | Source: Ambulatory Visit | Attending: Radiation Oncology | Admitting: Radiation Oncology

## 2014-05-10 DIAGNOSIS — Z510 Encounter for antineoplastic radiation therapy: Secondary | ICD-10-CM | POA: Diagnosis not present

## 2014-05-13 ENCOUNTER — Ambulatory Visit
Admission: RE | Admit: 2014-05-13 | Discharge: 2014-05-13 | Disposition: A | Discharge status: Home / Self Care | Payer: Medicare Other | Source: Ambulatory Visit | Attending: Radiation Oncology | Admitting: Radiation Oncology

## 2014-05-13 DIAGNOSIS — Z510 Encounter for antineoplastic radiation therapy: Secondary | ICD-10-CM | POA: Diagnosis not present

## 2014-05-14 ENCOUNTER — Ambulatory Visit
Admission: RE | Admit: 2014-05-14 | Discharge: 2014-05-14 | Disposition: A | Discharge status: Home / Self Care | Payer: Medicare Other | Source: Ambulatory Visit | Attending: Radiation Oncology | Admitting: Radiation Oncology

## 2014-05-14 ENCOUNTER — Encounter: Payer: Self-pay | Admitting: Radiation Oncology

## 2014-05-14 VITALS — BP 112/60 | HR 84 | Resp 18 | Wt 264.5 lb

## 2014-05-14 DIAGNOSIS — C50312 Malignant neoplasm of lower-inner quadrant of left female breast: Secondary | ICD-10-CM

## 2014-05-14 DIAGNOSIS — IMO0001 Reserved for inherently not codable concepts without codable children: Secondary | ICD-10-CM

## 2014-05-14 DIAGNOSIS — Z510 Encounter for antineoplastic radiation therapy: Secondary | ICD-10-CM | POA: Diagnosis not present

## 2014-05-14 HISTORY — DX: Reserved for inherently not codable concepts without codable children: IMO0001

## 2014-05-14 NOTE — Progress Notes (Signed)
Called in [place] apply once per day to affected area as ordered by Dr. Pablo Ledger to [facility]. Product not in stock, will be ordered, and should arrive around 12 tomorrow. Patient understands to pick up product tomorrow from pharmacy and present to the clinic so that nursing staff can apply it.

## 2014-05-14 NOTE — Progress Notes (Addendum)
Weekly Management Note Current Dose: 44  Gy  Projected Dose: 50 Gy   Narrative:  The patient presents for routine under treatment assessment.  CBCT/MVCT images/Port film x-rays were reviewed.  The chart was checked. More painful moist desquamation under left breast. Gentian violet helped somehwat but not a lot. Right iframammary fold is fine with micanazole powder.  Physical Findings: Weight: 264 lb 8 oz (119.976 kg). Confluent moist desquamation over left inframammary fold. Oozing, red, with some skin islands.   Impression:  Worsening moist desquation in the setting of fungal infection.   Plan: Allergic to sulfa so silvadene is out. Tried gentian violet with little relief.  Will try medihoney to affected area and see if we can get this to dry up. She should keep area dry and exposed as much as possible. Ok to take antiinflammatories prn for pain. Stop RT.

## 2014-05-14 NOTE — Progress Notes (Signed)
Hyperpigmentation with moist desquamation noted under left breast. Hyperpigmentation of left axilla without desquamation noted. Genentian violet has completely worn off. Patient reports she believe it lasted for approximately four days. Reports irritated skin is painful. Reports fatigue. Reports using radiaplex bid as directed.

## 2014-05-15 ENCOUNTER — Ambulatory Visit: Payer: Medicare Other

## 2014-05-15 ENCOUNTER — Encounter: Payer: Self-pay | Admitting: Radiation Oncology

## 2014-05-15 NOTE — Progress Notes (Signed)
Patient presented to the clinic today with medihoney in hand. She requested this writer assist her with application. Hyperpigmented skin under left mammary fold with moist desquamation noted. Dabbed dry affected area with sterile gauze applied medihoney with gloved finger then placed a no adherent dressing over ointment. Allowed patient's breast to naturally fall over dressing to hold in place. Patient tolerated well. Patient understands to return tomorrow for skin check with Dr. Pablo Ledger.

## 2014-05-16 ENCOUNTER — Ambulatory Visit: Payer: Medicare Other

## 2014-05-16 ENCOUNTER — Other Ambulatory Visit: Payer: Self-pay | Admitting: Radiation Oncology

## 2014-05-16 MED ORDER — DOXYCYCLINE HYCLATE 100 MG PO TABS: 100.0000 mg | ORAL_TABLET | Freq: Two times a day (BID) | ORAL | Status: DC

## 2014-05-17 ENCOUNTER — Ambulatory Visit: Payer: Medicare Other

## 2014-05-17 NOTE — Progress Notes (Signed)
  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 Ms. Ferrick was treated in the prone position to minimize skin folds.   Narrative: [name] began treatment with significant intertrigo in the inframammary folds. She unfortunately developed moist desquamation in the inframammary fold which truncated her treatment early. We attempted conservative treatment but eventually had to stop treatment in order to allow healing.   Plan: The patient has completed radiation treatment. She will return in 1 day to allow Medihoney placement and then will be followed weekly for signs of infection.   ------------------------------------------------  Thea Silversmith, MD

## 2014-05-20 NOTE — Progress Notes (Signed)
  Radiation Oncology         [phone] ________________________________  Name: Candice Reid MRN: [id number]  Date: 04/12/14  DOB: 1941/07/21  Simulation Verification Note  Status: outpatient  NARRATIVE: The patient was brought to the treatment unit and placed in the planned treatment position. The clinical setup was verified. Then port films were obtained and uploaded to the radiation oncology medical record software.  The treatment beams were carefully compared against the planned radiation fields. The position location and shape of the radiation fields was reviewed. The targeted volume of tissue appears appropriately covered by the radiation beams. Organs at risk appear to be excluded as planned.  Based on my personal review, I approved the simulation verification. The patient's treatment will proceed as planned.  ------------------------------------------------  Thea Silversmith, MD

## 2014-05-21 ENCOUNTER — Ambulatory Visit
Admission: RE | Admit: 2014-05-21 | Discharge: 2014-05-21 | Disposition: A | Discharge status: Home / Self Care | Payer: Medicare Other | Source: Ambulatory Visit | Attending: Radiation Oncology | Admitting: Radiation Oncology

## 2014-05-21 DIAGNOSIS — C50312 Malignant neoplasm of lower-inner quadrant of left female breast: Secondary | ICD-10-CM

## 2014-05-21 NOTE — Progress Notes (Signed)
Patient presented to the clinic today unaccompanied for skin check following use of Medihoney. Skin under left inframammary still with hyperpigmentation and moist desquamation. In addition, an area the size of a quarter of moist desquamation has developed under the patient's left axilla. Skin assessed by Dr. Pablo Ledger. Cleansed both sites with normal saline and patted dry. Applied interdry as directed and secured with minimal paper tape to intact skin. Directed patient upon use and sent patient home with direction. Patient repeated directions back to this writer verbalizing understanding. Patient understands to continue doxycycline as directed and return Friday morning for a repeat skin check.

## 2014-05-21 NOTE — Progress Notes (Signed)
   Department of Radiation Oncology  Phone:  [phone] Fax:        [phone]   Name: Candice Reid MRN: [id number]  DOB: 1940/11/18  Date: 05/21/2014  Follow Up Visit Note  Diagnosis: DCIS of the left breast  Summary and Interval since last radiation: 44 Gy to the left breast  Interval History: Candice Reid presents today for routine followup.  She feels the medihoney has minimally improved her left breast and now is experiencing more pain in her left axilla. She is taking the doxycycline but again has not noticed much improvement in her symptoms of pain, irritation and weeping of the wound. She is afebrile.   Allergies:  Allergies  Allergen Reactions  . Ace Inhibitors Cough  . Metronidazole Nausea Only  . Morphine And Related Itching  . Oxycodone-Acetaminophen Nausea Only  . Septra [Sulfamethoxazole-Trimethoprim] Nausea Only  . Sulfa Antibiotics Nausea Only  . Valium [Diazepam] Other (See Comments)    depression    Medications:  Current Outpatient Prescriptions  Medication Sig Dispense Refill  . ALPRAZolam (XANAX) 0.5 MG tablet Take 0.5 mg by mouth. 1/2 as needed       . co-enzyme Q-10 50 MG capsule Take 50 mg by mouth daily.      [name] Kitchen doxycycline (VIBRA-TABS) 100 MG tablet Take 1 tablet (100 mg total) by mouth 2 (two) times daily.  20 tablet  0  . DULoxetine (CYMBALTA) 60 MG capsule Take 60 mg by mouth daily.      [name] Kitchen emollient (RADIAGEL) gel Apply topically as needed for wound care.      . meloxicam (MOBIC) 15 MG tablet Take 15 mg by mouth daily.      . metoprolol (TOPROL-XL) 100 MG 24 hr tablet Take 100 mg by mouth daily.        . non-metallic deodorant Jethro Poling) MISC Apply 1 application topically daily as needed.      . OMEGA-3 1000 MG CAPS Take 1 capsule by mouth daily.        [name] Kitchen omeprazole (PRILOSEC OTC) 20 MG tablet Take 20 mg by mouth daily.        . pravastatin (PRAVACHOL) 10 MG tablet Take 10 mg by mouth 2 (two) times a week.       . terbinafine (LAMISIL) 250 MG  tablet       . valsartan-hydrochlorothiazide (DIOVAN-HCT) 320-25 MG per tablet Take 1 tablet by mouth daily.      [name] Kitchen VITAMIN D, CHOLECALCIFEROL, PO Take 200 Units by mouth daily.        No current facility-administered medications for this encounter.    Physical Exam:   Weepy, moist desquamation under the left breast. Healing along the lateral breast but moving medially. New moist desquamation under left axilla.   IMPRESSION: Candice Reid is a 73 y.o. female s/p radiation with continued moist desquamation and difficulties with wound healing   PLAN:  After long discussion and consultation with a wound nurse we are going to try a new dressing with silver and antibiotic properties to help. This was applied here in clinic and instructions for use given. She will be seen by nursing on Friday again. She will continue the antibiotics.     Thea Silversmith, MD

## 2014-05-24 ENCOUNTER — Telehealth: Payer: Self-pay | Admitting: *Deleted

## 2014-05-24 ENCOUNTER — Other Ambulatory Visit: Payer: Self-pay | Admitting: Radiation Oncology

## 2014-05-24 DIAGNOSIS — C50312 Malignant neoplasm of lower-inner quadrant of left female breast: Secondary | ICD-10-CM

## 2014-05-24 NOTE — Telephone Encounter (Signed)
Patient returned call, gave her information to have appt sept 1 at [facility] office, [address], verbal read back,patient stated to thank Romie Jumper Gave read back of information 4:23 PM

## 2014-05-24 NOTE — Telephone Encounter (Signed)
CALLED PATIENT TO INFORM OF APPT. WITH DR. Lyndee Leo SANGER ON 05-28-14 - ARRIVAL TIME - 8:15 AM - ADDRESS - [address], [address], LVM FOR A RETURN CALL

## 2014-05-24 NOTE — Progress Notes (Signed)
Patient in to nursing for skin assessment.Left axilla is much better with all new skin but right mammary fold is redder with moist drainage.Skin cleaned with normal saline and foam dressing applied to be used once or twice daily until she returns on Tuesday 05/28/14.[facility]  unable to see patient until 06/17/14,  so patient has now been scheduled to see Dr.Sanger in [place]  On 05/28/14 at 8:15 am.Shirley has left message for patient to return call so we may give her this information.

## 2014-06-04 ENCOUNTER — Telehealth: Payer: Self-pay | Admitting: Oncology

## 2014-06-04 ENCOUNTER — Ambulatory Visit (HOSPITAL_BASED_OUTPATIENT_CLINIC_OR_DEPARTMENT_OTHER): Payer: Medicare Other | Admitting: Oncology

## 2014-06-04 VITALS — BP 132/51 | HR 84 | Temp 97.6°F | Resp 20 | Ht 64.0 in | Wt 263.2 lb

## 2014-06-04 DIAGNOSIS — C50319 Malignant neoplasm of lower-inner quadrant of unspecified female breast: Secondary | ICD-10-CM

## 2014-06-04 DIAGNOSIS — Z170 Estrogen receptor positive status [ER+]: Secondary | ICD-10-CM

## 2014-06-04 DIAGNOSIS — C50312 Malignant neoplasm of lower-inner quadrant of left female breast: Secondary | ICD-10-CM

## 2014-06-04 MED ORDER — ANASTROZOLE 1 MG PO TABS: 1.0000 mg | ORAL_TABLET | Freq: Every day | ORAL | Status: DC

## 2014-06-04 NOTE — Progress Notes (Signed)
[place]  [phone] [phone]     ID: [name] OB: April 03, 1941  MR#: [id number]  QBH#:[id number]  PCP: Vena Austria, MD GYN:  Evette Cristal SU:  Alphonsa Overall OTHER MD: Thea Silversmith  CHIEF COMPLAINT: Early-stage breast cancer  BREAST CANCER HISTORY: That he had routine screening mammography may 2015 showing a possible mass in the left breast. On 02/08/2014 she had left diagnostic mammography and ultrasonography of the breast Center, showing breast density category B. Otherwise a 1.1 cm ovoid mass in the inner lower quadrant of the left breast. Ultrasound confirmed an irregularly shaped mass measuring 1.0 cm. Ultrasound of the left axilla was unremarkable.  Biopsy of the left breast mass in question 02/14/2014 showed ([id number]) and invasive ductal carcinoma, grade 2, estrogen and progesterone receptor positive, with an MIB-1 of 10%. HER-2 is pending.  The patient's subsequent history is as detailed below  INTERVAL HISTORY: Destinee returns today for followup of her early stage breast cancer. Shortly after her last visit here she underwent left lumpectomy and sentinel lymph node sampling. This showed (03/11/2014, [id number]) and invasive ductal carcinoma, measuring 1.5 cm, grade 2, with negative margins. The sentinel lymph node was negative. HER-2 was repeated and was again negative. At that point the patient met with patient oncology and started her adjuvant radiation treatments, which were completed 05/14/2014. The patient is here today to discuss adjuvant antiestrogen.   REVIEW OF SYSTEMS: Lariya usually exercises but doing water aerobics and bowling once a week. However she has not been able to get there yet because of her recent radiation treatments. The rash she had on her chest is completely resolved. She is worried that the pain she is having in her knees, which are due to arthritis, may worsen with anti-estrogens. She also  told me that one of her nieces was diagnosed with cirrhosis due to tamoxifen. We spent quite a bit of time today going over these concerns. A detailed review of systems today was otherwise noncontributory  PAST MEDICAL HISTORY: Past Medical History  Diagnosis Date  . Asthma   . Kidney stones   . Arthritis   . Hypertension   . Depression   . Hemorrhoids   . Hyperlipemia   . Anxiety                                                                                                                                                                                                                                                                  .  GERD (gastroesophageal reflux disease)   . Obesity   . Hot flashes   . Phobia   . Complication of anesthesia     shaky-hard to wake up  . Wears glasses     reading  . Dental bridge present   . Breast cancer     PAST SURGICAL HISTORY: Past Surgical History  Procedure Laterality Date  . Knee surgery      x 2-left  . Colonoscopy  03/11/2011    diverticulosis, internal hemorrhoids  . Breast lumpectomy  1985    left-neg  . Dilation and curettage of uterus    . Cystoscopy w/ ureteral stent placement      removed  . Vaginal hysterectomy  1988    endometriosis, fibroids    FAMILY HISTORY Family History  Problem Relation Age of Onset  . Diabetes Mother   . Colon cancer Neg Hx   . Colon polyps Sister   . Lung cancer Sister   . Ovarian cancer Sister   . Brain cancer Sister   . Prostate cancer Father   . Brain cancer Brother   The patient's father died from cancer, unknown type, at the age of 35. The patient's mother died from a stroke in the setting of diabetes at the age of 18. The patient had one brother, 3 sisters. One sister had lung cancer diagnosed at age 68. Another had brain cancer the age of 47. One brother also had brain cancer, at the age of 63. There is no history of breast or ovarian cancer in the family to the patient's  knowledge  GYNECOLOGIC HISTORY:  Menarche age 40, first live birth age 54, the patient is [place] P2. She is status post remote hysterectomy with bilateral salpingo-oophorectomy. She took hormone replacement approximately 20 years, stopping approximately 2010.   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 Her daughter [name] lives in [place] and works in Press photographer. The patient's son [name] "[name] " Viets also lives in [place], where he is a Airline pilot. The patient has 3 grandchildren. She att[employer]    ADVANCED DIRECTIVES: Not in place. These were discussed at the initial visit 02/20/2014. The patient tells me she has the appropriate documents in hand and intends to name her daughter [name] as healthcare power of attorney. [name] can be reached at [place]: History  Substance Use Topics  . Smoking status: Never Smoker   . Smokeless tobacco: Never Used  . Alcohol Use: No     Colonoscopy: 2013/ [facility]  PAP: Status post hysterectomy  Bone density:10/11/2013 at Dr. Noland Fordyce, normal (lowest T score of -0.2   Lipid panel:  Allergies  Allergen Reactions  . Ace Inhibitors Cough  . Metronidazole Nausea Only  . Morphine And Related Itching  . Oxycodone-Acetaminophen Nausea Only  . Septra [Sulfamethoxazole-Trimethoprim] Nausea Only  . Sulfa Antibiotics Nausea Only  . Valium [Diazepam] Other (See Comments)    depression    Current Outpatient Prescriptions  Medication Sig Dispense Refill  . ALPRAZolam (XANAX) 0.5 MG tablet Take 0.5 mg by mouth. 1/2 as needed       . co-enzyme Q-10 50 MG capsule Take 50 mg by mouth daily.      [name] Kitchen doxycycline (VIBRA-TABS) 100 MG tablet Take 1 tablet (100 mg total) by mouth 2 (two) times daily.  20 tablet  0  . DULoxetine (  CYMBALTA) 60 MG capsule Take 60 mg by mouth daily.      [name] Kitchen emollient (RADIAGEL) gel Apply  topically as needed for wound care.      . meloxicam (MOBIC) 15 MG tablet Take 15 mg by mouth daily.      . metoprolol (TOPROL-XL) 100 MG 24 hr tablet Take 100 mg by mouth daily.        . non-metallic deodorant Jethro Poling) MISC Apply 1 application topically daily as needed.      . OMEGA-3 1000 MG CAPS Take 1 capsule by mouth daily.        [name] Kitchen omeprazole (PRILOSEC OTC) 20 MG tablet Take 20 mg by mouth daily.        . pravastatin (PRAVACHOL) 10 MG tablet Take 10 mg by mouth 2 (two) times a week.       . terbinafine (LAMISIL) 250 MG tablet       . valsartan-hydrochlorothiazide (DIOVAN-HCT) 320-25 MG per tablet Take 1 tablet by mouth daily.      [name] Kitchen VITAMIN D, CHOLECALCIFEROL, PO Take 200 Units by mouth daily.        No current facility-administered medications for this visit.    OBJECTIVE: Older white woman in no acute distress Filed Vitals:   06/04/14 1401  BP: 132/51  Pulse: 84  Temp: 97.6 F (36.4 C)  Resp: 20     Body mass index is 45.16 kg/(m^2).    ECOG FS:1 - Symptomatic but completely ambulatory  Ocular: Sclerae unicteric, EOMs intact Ear-nose-throat: Oropharynx clear Lymphatic: No cervical or supraclavicular adenopathy Lungs no rales or rhonchi Heart regular rate and rhythm, Abd soft, obese, nontender, positive bowel sounds, no masses palpated MSK no focal spinal tenderness her  Neuro: non-focal, well-oriented, appropriate affect Breasts: The right breast is unremarkable. The left breast is status post lumpectomy and radiation. There is very minimal erythema remaining. There is no desquamation. There is minimal crusting over the nipple, which is not retracted. The left axilla is benign   LAB RESULTS:  CMP     Component Value Date/Time   NA 144 02/20/2014 0843   NA 141 01/18/2013 2016   K 3.9 02/20/2014 0843   K 4.0 01/18/2013 2016   CL 101 01/18/2013 2016   CO2 28 02/20/2014 0843   CO2 31 01/18/2013 2016   GLUCOSE 105 02/20/2014 0843   GLUCOSE 103* 01/18/2013 2016   BUN 21.4  02/20/2014 0843   BUN 20 01/18/2013 2016   CREATININE 0.9 02/20/2014 0843   CREATININE 0.90 01/18/2013 2016   CALCIUM 9.5 02/20/2014 0843   CALCIUM 9.8 01/18/2013 2016   PROT 7.2 02/20/2014 0843   ALBUMIN 3.5 02/20/2014 0843   AST 28 02/20/2014 0843   ALT 33 02/20/2014 0843   ALKPHOS 75 02/20/2014 0843   BILITOT 0.39 02/20/2014 0843   GFRNONAA 63* 01/18/2013 2016   GFRAA 73* 01/18/2013 2016    I No results found for this basename: SPEP,  UPEP,   kappa and lambda light chains    Lab Results  Component Value Date   WBC 9.4 02/20/2014   NEUTROABS 4.9 02/20/2014   HGB 13.7 03/11/2014   HCT 39.6 02/20/2014   MCV 90.4 02/20/2014   PLT 276 02/20/2014      Chemistry      Component Value Date/Time   NA 144 02/20/2014 0843   NA 141 01/18/2013 2016   K 3.9 02/20/2014 0843   K 4.0 01/18/2013 2016   CL 101 01/18/2013 2016   CO2  28 02/20/2014 0843   CO2 31 01/18/2013 2016   BUN 21.4 02/20/2014 0843   BUN 20 01/18/2013 2016   CREATININE 0.9 02/20/2014 0843   CREATININE 0.90 01/18/2013 2016      Component Value Date/Time   CALCIUM 9.5 02/20/2014 0843   CALCIUM 9.8 01/18/2013 2016   ALKPHOS 75 02/20/2014 0843   AST 28 02/20/2014 0843   ALT 33 02/20/2014 0843   BILITOT 0.39 02/20/2014 0843       No results found for this basename: LABCA2    No components found with this basename: LABCA125    No results found for this basename: INR,  in the last 168 hours  Urinalysis No results found for this basename: colorurine,  appearanceur,  labspec,  phurine,  glucoseu,  hgbur,  bilirubinur,  ketonesur,  proteinur,  urobilinogen,  nitrite,  leukocytesur   STUDIES: No results found.  ASSESSMENT: 73 y.o.  [place] woman status post left breast biopsy 02/14/2014 for a clinical T1b N0, stage IA invasive ductal carcinoma, grade 2, estrogen and progesterone receptor positive, with a low MIB-1-at 10%, and HER-2 results negative  (1) status post left lumpectomy and sentinel lymph node sampling 03/11/2014 for a pT1c  pN0, Stage IA invasive ductal carcinoma, grade 2, repeat HER-2 again negative, with negative margins  (2) Adjuvant radiation completed 18 2015  (3) to start anastrozole 06/27/2014  PLAN: I spent approximately 45 minutes with Shayleen going over the difference between tamoxifen and anastrozole. She understands that anastrozole has not been shown to cause cirrhosis, which was something she was afraid of. She also thought that it might prevent brain cancer, but of course it does not affect cancer is not related to estrogen. We did discuss the possible toxicities, side effects and complications of anastrozole and also cost issues.  I think he would be best if she started October 1, which will give her time to recover from her radiation. I strongly encouraged her to resume her usual exercise routine.   If she can tolerate the anastrozole without significant side effects, the plan will be to continue that for 5 years. I will see her again in January 2016 and will consider starting to see her every 6 months thereafter. She knows to call for any problems that may develop before her next visit.  Chauncey Cruel, MD   06/04/2014 2:18 PM

## 2014-06-06 ENCOUNTER — Encounter: Payer: Self-pay | Admitting: Radiation Oncology

## 2014-06-14 ENCOUNTER — Encounter: Payer: Self-pay | Admitting: Radiation Oncology

## 2014-06-14 ENCOUNTER — Ambulatory Visit
Admission: RE | Admit: 2014-06-14 | Discharge: 2014-06-14 | Disposition: A | Discharge status: Home / Self Care | Payer: Medicare Other | Source: Ambulatory Visit | Attending: Radiation Oncology | Admitting: Radiation Oncology

## 2014-06-14 VITALS — BP 125/64 | HR 82 | Temp 97.8°F | Resp 28 | Wt 266.2 lb

## 2014-06-14 DIAGNOSIS — C50312 Malignant neoplasm of lower-inner quadrant of left female breast: Secondary | ICD-10-CM

## 2014-06-14 NOTE — Progress Notes (Signed)
   Department of Radiation Oncology  Phone:  [phone] Fax:        [phone]   Name: Candice Reid MRN: [id number]  DOB: 01/26/1941  Date: 06/14/2014  Follow Up Visit Note  Diagnosis: DCIS of the left breast  Summary and Interval since last radiation: 44 Gy to the left breast completed 05/14/14 (1 month from treatment)  Interval History: Candice Reid presents today for routine followup.  She feels the interdry really helped wit her skin and it is completely back to normal. She is feeling better as well. She is going to start her AI on 10/1 and sees Dr. Jana Hakim in December.   Allergies:  Allergies  Allergen Reactions  . Ace Inhibitors Cough  . Metronidazole Nausea Only  . Morphine And Related Itching  . Oxycodone-Acetaminophen Nausea Only  . Septra [Sulfamethoxazole-Trimethoprim] Nausea Only  . Sulfa Antibiotics Nausea Only  . Valium [Diazepam] Other (See Comments)    depression    Medications:  Current Outpatient Prescriptions  Medication Sig Dispense Refill  . ALPRAZolam (XANAX) 0.5 MG tablet Take 0.5 mg by mouth. 1/2 as needed       . anastrozole (ARIMIDEX) 1 MG tablet Take 1 tablet (1 mg total) by mouth daily.  90 tablet  4  . co-enzyme Q-10 50 MG capsule Take 50 mg by mouth daily.      [name] Kitchen doxycycline (VIBRA-TABS) 100 MG tablet Take 1 tablet (100 mg total) by mouth 2 (two) times daily.  20 tablet  0  . DULoxetine (CYMBALTA) 60 MG capsule Take 60 mg by mouth daily.      [name] Kitchen emollient (RADIAGEL) gel Apply topically as needed for wound care.      . meloxicam (MOBIC) 15 MG tablet Take 15 mg by mouth daily.      . metoprolol (TOPROL-XL) 100 MG 24 hr tablet Take 100 mg by mouth daily.        . non-metallic deodorant Jethro Poling) MISC Apply 1 application topically daily as needed.      . OMEGA-3 1000 MG CAPS Take 1 capsule by mouth daily.        [name] Kitchen omeprazole (PRILOSEC OTC) 20 MG tablet Take 20 mg by mouth daily.        . pravastatin (PRAVACHOL) 10 MG tablet Take 10 mg by mouth 2  (two) times a week.       . terbinafine (LAMISIL) 250 MG tablet       . valsartan-hydrochlorothiazide (DIOVAN-HCT) 320-25 MG per tablet Take 1 tablet by mouth daily.      [name] Kitchen VITAMIN D, CHOLECALCIFEROL, PO Take 200 Units by mouth daily.        No current facility-administered medications for this encounter.    Physical Exam: Pink skin but completely intact under left breast. No evidence of breakdown.   IMPRESSION: [name] is a 73 y.o. female s/p radiation with continued moist desquamation and difficulties with wound healing no healed up from radiation.   PLAN:   Follow up in 6 months. Start AI. Continue yearly mammograms. She requested help obtaining some more interdry to help with her inframammary folds during the summer and I will ask my nurse to see if we can get some for her.     Thea Silversmith, MD

## 2014-06-14 NOTE — Progress Notes (Signed)
She is currently in no pain. Pt complains of itching over the treated area.  Pt left breast, noted moisture, which pt reports is radioplex, under breast fold.  Skin appears to have a slight pink, elevated rash.  Pt reports she applies radiaplex as needed.

## 2014-08-15 ENCOUNTER — Other Ambulatory Visit: Payer: Self-pay | Admitting: Family Medicine

## 2014-08-15 DIAGNOSIS — M79605 Pain in left leg: Principal | ICD-10-CM

## 2014-08-15 DIAGNOSIS — M79604 Pain in right leg: Secondary | ICD-10-CM

## 2014-08-29 ENCOUNTER — Ambulatory Visit
Admission: RE | Admit: 2014-08-29 | Discharge: 2014-08-29 | Disposition: A | Discharge status: Home / Self Care | Payer: Medicare Other | Source: Ambulatory Visit | Attending: Family Medicine | Admitting: Family Medicine

## 2014-08-29 DIAGNOSIS — M79604 Pain in right leg: Secondary | ICD-10-CM

## 2014-08-29 DIAGNOSIS — M79605 Pain in left leg: Principal | ICD-10-CM

## 2014-10-08 ENCOUNTER — Ambulatory Visit (HOSPITAL_BASED_OUTPATIENT_CLINIC_OR_DEPARTMENT_OTHER): Payer: Medicare Other | Admitting: Oncology

## 2014-10-08 ENCOUNTER — Telehealth: Payer: Self-pay | Admitting: Oncology

## 2014-10-08 VITALS — BP 148/70 | HR 97 | Temp 97.4°F | Resp 18 | Ht 64.0 in | Wt 255.2 lb

## 2014-10-08 DIAGNOSIS — C50312 Malignant neoplasm of lower-inner quadrant of left female breast: Secondary | ICD-10-CM

## 2014-10-08 DIAGNOSIS — Z853 Personal history of malignant neoplasm of breast: Secondary | ICD-10-CM

## 2014-10-08 DIAGNOSIS — Z79811 Long term (current) use of aromatase inhibitors: Secondary | ICD-10-CM

## 2014-10-08 NOTE — Progress Notes (Signed)
[place]  [phone] [phone]     ID: [name] OB: October 28, 1940  MR#: [id number]  ZHY#:[id number]  PCP: Vena Austria, MD GYN:  Evette Cristal SU:  Alphonsa Overall OTHER MD: Thea Silversmith  CHIEF COMPLAINT: Estrogen receptor positive early-stage breast cancer  CURRENT TREATMENT: Anastrozole  BREAST CANCER HISTORY: From the original intake note:   That he had routine screening mammography may 2015 showing a possible mass in the left breast. On 02/08/2014 she had left diagnostic mammography and ultrasonography of the breast Center, showing breast density category B. Otherwise a 1.1 cm ovoid mass in the inner lower quadrant of the left breast. Ultrasound confirmed an irregularly shaped mass measuring 1.0 cm. Ultrasound of the left axilla was unremarkable.  Biopsy of the left breast mass in question 02/14/2014 showed ([id number]) and invasive ductal carcinoma, grade 2, estrogen and progesterone receptor positive, with an MIB-1 of 10%. HER-2 is pending.  The patient's subsequent history is as detailed below  INTERVAL HISTORY: Sonal returns today for followup of her early stage breast cancer. Since her last visit here she completed her radiation treatments and started anastrozole (early October 2015). She was able to obtain it for $2. She has had no increase in hot flashes or change in vaginal dryness or any other symptoms from the drug that she is aware of.  REVIEW OF SYSTEMS: Yula still actively grieves for her husband, who died 4 years ago. This seems to be very normal situation: He does not keep her for example from enjoying the holidays, when she went to the [employer]. Sometimes she has soreness in the left breast. This can last up to 10 minutes. It resolves without any intervention. She's had a little bit of a runny nose and a little bit of a cold which is clearing. She can be short of breath with exercise but she goes bowling  without any problem and she does pool exercises twice a week "when I get to it". She has a history of palpitations which are not associated with exercise, chest pain or pressure, or shortness of breath. She cannot tell me when they occur. She has back and joint pain which is not more intense or persistent than before. She describes herself is anxious and depressed and is having some phobia--these are not disabling.. A detailed review of systems was otherwise noncontributory  PAST MEDICAL HISTORY: Past Medical History  Diagnosis Date  . Asthma   . Kidney stones   . Arthritis   . Hypertension   . Depression   . Hemorrhoids   . Hyperlipemia   . Anxiety                                                                                                                                                                                                                                                                  .  GERD (gastroesophageal reflux disease)   . Obesity   . Hot flashes   . Phobia   . Complication of anesthesia     shaky-hard to wake up  . Wears glasses     reading  . Dental bridge present   . Breast cancer     PAST SURGICAL HISTORY: Past Surgical History  Procedure Laterality Date  . Knee surgery      x 2-left  . Colonoscopy  03/11/2011    diverticulosis, internal hemorrhoids  . Breast lumpectomy  1985    left-neg  . Dilation and curettage of uterus    . Cystoscopy w/ ureteral stent placement      removed  . Vaginal hysterectomy  1988    endometriosis, fibroids    FAMILY HISTORY Family History  Problem Relation Age of Onset  . Diabetes Mother   . Colon cancer Neg Hx   . Colon polyps Sister   . Lung cancer Sister   . Ovarian cancer Sister   . Brain cancer Sister   . Prostate cancer Father   . Brain cancer Brother   The patient's father died from cancer, unknown type, at the age of 27. The patient's mother died from a stroke in the setting of diabetes at the age of  61. The patient had one brother, 3 sisters. One sister had lung cancer diagnosed at age 78. Another had brain cancer the age of 23. One brother also had brain cancer, at the age of 1. There is no history of breast or ovarian cancer in the family to the patient's knowledge  GYNECOLOGIC HISTORY:  Menarche age 24, first live birth age 81, the patient is Gabbs P2. She is status post remote hysterectomy with bilateral salpingo-oophorectomy. She took hormone replacement approximately 20 years, stopping approximately 2010.   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 Her daughter [name] lives in [place] and works in Press photographer. The patient's son [name] "[name] " Coryell also lives in [place], where he is a Airline pilot. The patient has 3 grandchildren. She attends [employer]    ADVANCED DIRECTIVES: Not in place. These were discussed at the initial visit 02/20/2014. The patient tells me she has the appropriate documents in hand and intends to name her daughter [name] as healthcare power of attorney. [name] can be reached at [place]: History  Substance Use Topics  . Smoking status: Never Smoker   . Smokeless tobacco: Never Used  . Alcohol Use: No     Colonoscopy: 2013/ [facility]  PAP: Status post hysterectomy  Bone density:10/11/2013 at Dr. Noland Fordyce, normal (lowest T score of -0.2   Lipid panel:  Allergies  Allergen Reactions  . Ace Inhibitors Cough  . Metronidazole Nausea Only  . Morphine And Related Itching  . Oxycodone-Acetaminophen Nausea Only  . Septra [Sulfamethoxazole-Trimethoprim] Nausea Only  . Sulfa Antibiotics Nausea Only  . Valium [Diazepam] Other (See Comments)    depression    Current Outpatient Prescriptions  Medication Sig Dispense Refill  . ALPRAZolam (XANAX) 0.5 MG tablet Take 0.5 mg by mouth. 1/2 as needed     . anastrozole (ARIMIDEX)  1 MG tablet Take 1 tablet (1 mg total) by mouth daily. 90 tablet 4  . co-enzyme Q-10 50 MG capsule Take 50 mg by mouth daily.    [name] Kitchen doxycycline (VIBRA-TABS) 100 MG tablet Take 1 tablet (100  mg total) by mouth 2 (two) times daily. 20 tablet 0  . DULoxetine (CYMBALTA) 60 MG capsule Take 60 mg by mouth daily.    [name] Kitchen emollient (RADIAGEL) gel Apply topically as needed for wound care.    . meloxicam (MOBIC) 15 MG tablet Take 15 mg by mouth daily.    . metoprolol (TOPROL-XL) 100 MG 24 hr tablet Take 100 mg by mouth daily.      . non-metallic deodorant Jethro Poling) MISC Apply 1 application topically daily as needed.    . OMEGA-3 1000 MG CAPS Take 1 capsule by mouth daily.      [name] Kitchen omeprazole (PRILOSEC OTC) 20 MG tablet Take 20 mg by mouth daily.      . pravastatin (PRAVACHOL) 10 MG tablet Take 10 mg by mouth 2 (two) times a week.     . terbinafine (LAMISIL) 250 MG tablet     . valsartan-hydrochlorothiazide (DIOVAN-HCT) 320-25 MG per tablet Take 1 tablet by mouth daily.    [name] Kitchen VITAMIN D, CHOLECALCIFEROL, PO Take 200 Units by mouth daily.      No current facility-administered medications for this visit.    OBJECTIVE: Older white woman who appears stated age 29 Vitals:   10/08/14 1447  BP: 148/70  Pulse: 97  Temp: 97.4 F (36.3 C)  Resp: 18     Body mass index is 43.78 kg/(m^2).    ECOG FS:1 - Symptomatic but completely ambulatory  Sclerae unicteric, pupils equal and reactive Oropharynx clear and moist-- no thrush or other lesions No cervical or supraclavicular adenopathy Lungs no rales or rhonchi Heart regular rate and rhythm Abd soft, obese, nontender, positive bowel sounds MSK no focal spinal tenderness, no upper extremity lymphedema Neuro: nonfocal, well oriented, appropriate affect Breasts: The right breast is unremarkable. The left breast is status post lumpectomy and radiation. There is no evidence of local recurrence. The left axilla is benign.  LAB RESULTS:  CMP     Component Value  Date/Time   NA 144 02/20/2014 0843   NA 141 01/18/2013 2016   K 3.9 02/20/2014 0843   K 4.0 01/18/2013 2016   CL 101 01/18/2013 2016   CO2 28 02/20/2014 0843   CO2 31 01/18/2013 2016   GLUCOSE 105 02/20/2014 0843   GLUCOSE 103* 01/18/2013 2016   BUN 21.4 02/20/2014 0843   BUN 20 01/18/2013 2016   CREATININE 0.9 02/20/2014 0843   CREATININE 0.90 01/18/2013 2016   CALCIUM 9.5 02/20/2014 0843   CALCIUM 9.8 01/18/2013 2016   PROT 7.2 02/20/2014 0843   ALBUMIN 3.5 02/20/2014 0843   AST 28 02/20/2014 0843   ALT 33 02/20/2014 0843   ALKPHOS 75 02/20/2014 0843   BILITOT 0.39 02/20/2014 0843   GFRNONAA 63* 01/18/2013 2016   GFRAA 73* 01/18/2013 2016    I No results found for: SPEP  Lab Results  Component Value Date   WBC 9.4 02/20/2014   NEUTROABS 4.9 02/20/2014   HGB 13.7 03/11/2014   HCT 39.6 02/20/2014   MCV 90.4 02/20/2014   PLT 276 02/20/2014      Chemistry      Component Value Date/Time   NA 144 02/20/2014 0843   NA 141 01/18/2013 2016   K 3.9 02/20/2014 0843   K 4.0 01/18/2013 2016   CL 101 01/18/2013 2016   CO2 28 02/20/2014 0843   CO2 31 01/18/2013 2016   BUN 21.4 02/20/2014 0843   BUN 20 01/18/2013 2016   CREATININE 0.9 02/20/2014 0843   CREATININE 0.90  01/18/2013 2016      Component Value Date/Time   CALCIUM 9.5 02/20/2014 0843   CALCIUM 9.8 01/18/2013 2016   ALKPHOS 75 02/20/2014 0843   AST 28 02/20/2014 0843   ALT 33 02/20/2014 0843   BILITOT 0.39 02/20/2014 0843       No results found for: LABCA2  No components found for: LABCA125  No results for input(s): INR in the last 168 hours.  Urinalysis No results found for: COLORURINE   STUDIES: No results found.   ASSESSMENT: 74 y.o.  [place] woman status post left breast biopsy 02/14/2014 for a clinical T1b N0, stage IA invasive ductal carcinoma, grade 2, estrogen and progesterone receptor positive, with a low MIB-1-at 10%, and HER-2 results negative  (1) status post left lumpectomy  and sentinel lymph node sampling 03/11/2014 for a pT1c pN0, Stage IA invasive ductal carcinoma, grade 2, repeat HER-2 again negative, with negative margins  (2) Adjuvant radiation completed 05/14/2014 [44 Gy at 2 Gy per fraction x 22 fractions to the left breast]  (3) started anastrozole 06/27/2014  (a) bone density 10/01/2013 was normal   PLAN: Chantea is doing fine from a breast cancer point of view. She understands test with local treatment she already has a good prognosis. By taking anastrozole she is further cutting her risk of recurrence in half. She is tolerating it well and obtaining an adequate price.  Accordingly the plan will be for anastrozole to continue for total of 5 years. We will repeat a bone density January 2017.  She will see [place] again in may and then again in November. After that we will probably start seeing her on a once a year basis alternating with Dr. Lucia Gaskins.  Azilee has a good understanding of the overall plan. She agrees with it. She knows the goal of treatment in her case is cure. She will call with any problems that may develop before her next visit here.  Chauncey Cruel, MD   10/08/2014 2:56 PM

## 2014-10-08 NOTE — Telephone Encounter (Signed)
per pof to sch pt appt-gave pt copy of sch °

## 2014-12-12 ENCOUNTER — Telehealth: Payer: Self-pay | Admitting: *Deleted

## 2014-12-12 ENCOUNTER — Ambulatory Visit
Admission: RE | Admit: 2014-12-12 | Discharge: 2014-12-12 | Disposition: A | Discharge status: Home / Self Care | Payer: Medicare Other | Source: Ambulatory Visit | Attending: Radiation Oncology | Admitting: Radiation Oncology

## 2014-12-12 ENCOUNTER — Encounter: Payer: Self-pay | Admitting: Radiation Oncology

## 2014-12-12 VITALS — BP 147/95 | HR 89 | Temp 97.4°F | Resp 12 | Wt 265.1 lb

## 2014-12-12 DIAGNOSIS — C50312 Malignant neoplasm of lower-inner quadrant of left female breast: Secondary | ICD-10-CM

## 2014-12-12 NOTE — Progress Notes (Signed)
   Department of Radiation Oncology  Phone:  [phone] Fax:        [phone]   Name: Candice Reid MRN: [id number]  DOB: Dec 14, 1940  Date: 12/12/2014  Follow Up Visit Note  Diagnosis: DCIS of the left breast  Summary and Interval since last radiation: 44 Gy to the left breast completed 05/14/14 (1 month from treatment)  Interval History: Candice Reid presents today for routine followup. She is taking Arimidex and tolerating that well. She is due for mammograms in June. She has noticed a palpable area of concern in the RIGHT breast which she said Dr. Lucia Gaskins could not feel and she forgot to show to Dr. Jana Hakim. This is not growing. It concerns her.   Allergies:  Allergies  Allergen Reactions  . Ace Inhibitors Cough  . Metronidazole Nausea Only  . Morphine And Related Itching  . Oxycodone-Acetaminophen Nausea Only  . Septra [Sulfamethoxazole-Trimethoprim] Nausea Only  . Sulfa Antibiotics Nausea Only  . Valium [Diazepam] Other (See Comments)    depression    Medications:  Current Outpatient Prescriptions  Medication Sig Dispense Refill  . ALPRAZolam (XANAX) 0.5 MG tablet Take 0.5 mg by mouth. 1/2 as needed     . anastrozole (ARIMIDEX) 1 MG tablet Take 1 tablet (1 mg total) by mouth daily. 90 tablet 4  . co-enzyme Q-10 50 MG capsule Take 50 mg by mouth daily.    [name] Kitchen doxycycline (VIBRA-TABS) 100 MG tablet Take 1 tablet (100 mg total) by mouth 2 (two) times daily. 20 tablet 0  . DULoxetine (CYMBALTA) 60 MG capsule Take 60 mg by mouth daily.    [name] Kitchen emollient (RADIAGEL) gel Apply topically as needed for wound care.    . meloxicam (MOBIC) 15 MG tablet Take 15 mg by mouth daily.    . metoprolol (TOPROL-XL) 100 MG 24 hr tablet Take 100 mg by mouth daily.      . non-metallic deodorant Jethro Poling) MISC Apply 1 application topically daily as needed.    . OMEGA-3 1000 MG CAPS Take 1 capsule by mouth daily.      [name] Kitchen omeprazole (PRILOSEC OTC) 20 MG tablet Take 20 mg by mouth daily.      .  pravastatin (PRAVACHOL) 10 MG tablet Take 10 mg by mouth 2 (two) times a week.     . terbinafine (LAMISIL) 250 MG tablet     . valsartan-hydrochlorothiazide (DIOVAN-HCT) 320-25 MG per tablet Take 1 tablet by mouth daily.    [name] Kitchen VITAMIN D, CHOLECALCIFEROL, PO Take 200 Units by mouth daily.      No current facility-administered medications for this encounter.    Physical Exam: Pink skin but completely intact under left breast. No evidence of breakdown. Right breast- palpable in the far posterior aspect of the right breast at the inframammary fold is a small palpable mass about the size of a pea.  I think this could almost be her ribs or perhaps breast tissue. It seems to come and go.  No palpable axillary or supraclavicular adenopathy  IMPRESSION: Candice Reid is a 74 y.o. female s/p breast conservation, currently NED with concerning right breast abnormality  PLAN:   Continue AI. Continue yearly mammograms. I have ordeerd bilateral diagnostic mammograms and an ultrasound of this area on the right. We will call her with those results. If they are negative, I would release her back to Dr. Lucia Gaskins and Dr. Jana Hakim.     Thea Silversmith, MD

## 2014-12-12 NOTE — Telephone Encounter (Signed)
CALLED PATIENT TO INFORM OF MAMMOGRAM AND ULTRASOUND ON 12-18-14- ARRIVAL TIME - 10:30 AM @ [facility], LVM FOR A RETURN CALL

## 2014-12-12 NOTE — Progress Notes (Addendum)
She is currently in no pain. Reports she does have pain over her entire left breast occasionally, rating it a 3 and described as intermittent and sharp. Pt complains of fatigue and weakness .  Pt left breast- warm dry and intact.  Pt denies edema.  BP 147/95 mmHg  Pulse 89  Temp(Src) 97.4 F (36.3 C) (Oral)  Resp 12  Wt 265 lb 1.6 oz (120.249 kg)  SpO2 99%  Pt reports: Yes No Comments  Tamoxifen []  [x]    Arimidex [x]  []    Mammogram [x]   Date: upcoming []  Pt unsure of date   Pt reports she has a "lump" under right breast fold.  Reports its pea sized, non tender.  Reports she noticed it approximately 3-4 months.

## 2014-12-18 ENCOUNTER — Ambulatory Visit
Admission: RE | Admit: 2014-12-18 | Discharge: 2014-12-18 | Disposition: A | Discharge status: Home / Self Care | Payer: Medicare Other | Source: Ambulatory Visit | Attending: Radiation Oncology | Admitting: Radiation Oncology

## 2014-12-18 DIAGNOSIS — C50312 Malignant neoplasm of lower-inner quadrant of left female breast: Secondary | ICD-10-CM

## 2015-01-30 ENCOUNTER — Other Ambulatory Visit (HOSPITAL_COMMUNITY): Payer: Self-pay | Admitting: Obstetrics & Gynecology

## 2015-01-31 LAB — CYTOLOGY - PAP

## 2015-02-03 ENCOUNTER — Other Ambulatory Visit (HOSPITAL_BASED_OUTPATIENT_CLINIC_OR_DEPARTMENT_OTHER): Payer: Medicare Other

## 2015-02-03 DIAGNOSIS — C50312 Malignant neoplasm of lower-inner quadrant of left female breast: Secondary | ICD-10-CM

## 2015-02-03 DIAGNOSIS — Z853 Personal history of malignant neoplasm of breast: Secondary | ICD-10-CM | POA: Diagnosis not present

## 2015-02-03 LAB — COMPREHENSIVE METABOLIC PANEL (CC13)
ALT: 46 U/L (ref 0–55)
AST: 43 U/L — ABNORMAL HIGH (ref 5–34)
Albumin: 3.4 g/dL — ABNORMAL LOW (ref 3.5–5.0)
Alkaline Phosphatase: 82 U/L (ref 40–150)
Anion Gap: 11 mEq/L (ref 3–11)
BILIRUBIN TOTAL: 0.4 mg/dL (ref 0.20–1.20)
BUN: 19.9 mg/dL (ref 7.0–26.0)
CO2: 25 mEq/L (ref 22–29)
CREATININE: 0.9 mg/dL (ref 0.6–1.1)
Calcium: 9.6 mg/dL (ref 8.4–10.4)
Chloride: 105 mEq/L (ref 98–109)
EGFR: 60 mL/min/{1.73_m2} — ABNORMAL LOW (ref >90)
Glucose: 158 mg/dl — ABNORMAL HIGH (ref 70–140)
Potassium: 3.5 mEq/L (ref 3.5–5.1)
Sodium: 141 mEq/L (ref 136–145)
Total Protein: 6.9 g/dL (ref 6.4–8.3)

## 2015-02-03 LAB — CBC WITH DIFFERENTIAL/PLATELET
BASO%: 0.5 % (ref 0.0–2.0)
BASOS ABS: 0 10*3/uL (ref 0.0–0.1)
EOS ABS: 0.4 10*3/uL (ref 0.0–0.5)
EOS%: 4.3 % (ref 0.0–7.0)
HCT: 39.2 % (ref 34.8–46.6)
HGB: 13 g/dL (ref 11.6–15.9)
LYMPH%: 34 % (ref 14.0–49.7)
MCH: 29.8 pg (ref 25.1–34.0)
MCHC: 33.2 g/dL (ref 31.5–36.0)
MCV: 89.9 fL (ref 79.5–101.0)
MONO#: 0.9 10*3/uL (ref 0.1–0.9)
MONO%: 10.7 % (ref 0.0–14.0)
NEUT%: 50.5 % (ref 38.4–76.8)
NEUTROS ABS: 4.1 10*3/uL (ref 1.5–6.5)
PLATELETS: 233 10*3/uL (ref 145–400)
RBC: 4.36 10*6/uL (ref 3.70–5.45)
RDW: 13 % (ref 11.2–14.5)
WBC: 8.1 10*3/uL (ref 3.9–10.3)
lymph#: 2.7 10*3/uL (ref 0.9–3.3)

## 2015-02-10 ENCOUNTER — Encounter: Payer: Self-pay | Admitting: Nurse Practitioner

## 2015-02-10 ENCOUNTER — Telehealth: Payer: Self-pay | Admitting: Oncology

## 2015-02-10 ENCOUNTER — Other Ambulatory Visit: Payer: Self-pay | Admitting: *Deleted

## 2015-02-10 ENCOUNTER — Ambulatory Visit (HOSPITAL_BASED_OUTPATIENT_CLINIC_OR_DEPARTMENT_OTHER): Payer: Medicare Other | Admitting: Nurse Practitioner

## 2015-02-10 VITALS — BP 163/94 | HR 98 | Temp 97.6°F | Resp 18 | Ht 64.0 in | Wt 262.3 lb

## 2015-02-10 DIAGNOSIS — N898 Other specified noninflammatory disorders of vagina: Secondary | ICD-10-CM | POA: Diagnosis not present

## 2015-02-10 DIAGNOSIS — Z170 Estrogen receptor positive status [ER+]: Secondary | ICD-10-CM

## 2015-02-10 DIAGNOSIS — C50312 Malignant neoplasm of lower-inner quadrant of left female breast: Secondary | ICD-10-CM | POA: Diagnosis not present

## 2015-02-10 MED ORDER — ANASTROZOLE 1 MG PO TABS: 1.0000 mg | ORAL_TABLET | Freq: Every day | ORAL | Status: DC

## 2015-02-10 NOTE — Progress Notes (Signed)
Candice Reid  [phone] [phone]     ID: Candice Reid OB: August 03, 1941  MR#: [id number]  JYN#:[id number]  PCP: Candice Austria, MD GYN:  Candice Reid SU:  Candice Reid OTHER MD: Candice Reid  CHIEF COMPLAINT: Estrogen receptor positive early-stage breast cancer  CURRENT TREATMENT: Anastrozole  BREAST CANCER HISTORY: From the original intake note:   That he had routine screening mammography may 2015 showing a possible mass in the left breast. On 02/08/2014 she had left diagnostic mammography and ultrasonography of the breast Center, showing breast density category B. Otherwise a 1.1 cm ovoid mass in the inner lower quadrant of the left breast. Ultrasound confirmed an irregularly shaped mass measuring 1.0 cm. Ultrasound of the left axilla was unremarkable.  Biopsy of the left breast mass in question 02/14/2014 showed ([id number]) and invasive ductal carcinoma, grade 2, estrogen and progesterone receptor positive, with an MIB-1 of 10%. HER-2 is pending.  The patient's subsequent history is as detailed below  INTERVAL HISTORY: Candice Reid returns today for follow up of her early stage breast cancer. She has been on anastrozole since October 2015 and is tolerating this drug well. She has some mild hot flashes and vaginal changes, but denies an increase in arthralgias/myalgias. Her blood pressure is elevated today, but she states that she had taken her BP meds just shortly before arriving to the cancer center, and that they probably had not had time to kick in yet. The interval history is generally unremarkable. She is not exercising consistently, but does bowl and swim on occasion.   REVIEW OF SYSTEMS: Candice Reid denies fevers, chills, nausea, vomiting, or changes in bowel or bladder habits. She continues to have back pain from a pinched nerve that makes it difficult to walk. She receives injections to the offended area periodically to relieve the pain.  She denies shortness of breath or chest pain, but has a history of palpitations. She endorses anxiety and depression and is still grieving her husband appropriately. Her diet is poor because she has no one to cook for besides herself. She denies headaches, dizziness, weakness, or vision changes. A detailed review of systems is otherwise stable.  PAST MEDICAL HISTORY: Past Medical History  Diagnosis Date  . Asthma   . Kidney stones   . Arthritis   . Hypertension   . Depression   . Hemorrhoids   . Hyperlipemia   . Anxiety                                                                                                                                                                                                                                                                  .  GERD (gastroesophageal reflux disease)   . Obesity   . Hot flashes   . Phobia   . Complication of anesthesia     shaky-hard to wake up  . Wears glasses     reading  . Dental bridge present   . Breast cancer   . Radiation 05/14/14    44 Gy Left breast    PAST SURGICAL HISTORY: Past Surgical History  Procedure Laterality Date  . Knee surgery      x 2-left  . Colonoscopy  03/11/2011    diverticulosis, internal hemorrhoids  . Breast lumpectomy  1985    left-neg  . Dilation and curettage of uterus    . Cystoscopy w/ ureteral stent placement      removed  . Vaginal hysterectomy  1988    endometriosis, fibroids    FAMILY HISTORY Family History  Problem Relation Age of Onset  . Diabetes Mother   . Colon cancer Neg Hx   . Colon polyps Sister   . Lung cancer Sister   . Ovarian cancer Sister   . Brain cancer Sister   . Prostate cancer Father   . Brain cancer Brother   The patient's father died from cancer, unknown type, at the age of 95. The patient's mother died from a stroke in the setting of diabetes at the age of 43. The patient had one brother, 3 sisters. One sister had lung cancer diagnosed at age  34. Another had brain cancer the age of 25. One brother also had brain cancer, at the age of 23. There is no history of breast or ovarian cancer in the family to the patient's knowledge  GYNECOLOGIC HISTORY:  Menarche age 25, first live birth age 37, the patient is Candice Reid P2. She is status post remote hysterectomy with bilateral salpingo-oophorectomy. She took hormone replacement approximately 20 years, stopping approximately 2010.   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 Her daughter Candice Booty "[name]"  [name] lives in [place] and works in Press photographer. The patient's son Candice Reid also lives in [place], where he is a Airline pilot. The patient has 3 grandchildren. She attends [employer]    ADVANCED DIRECTIVES: Not in place. These were discussed at the initial visit 02/20/2014. The patient tells me she has the appropriate documents in hand and intends to name her daughter Candice Reid as healthcare power of attorney. Candice Reid can be reached at [place]: History  Substance Use Topics  . Smoking status: Never Smoker   . Smokeless tobacco: Never Used  . Alcohol Use: No     Colonoscopy: 2013/ [facility]  PAP: Status post hysterectomy  Bone density:10/11/2013 at Dr. Noland Fordyce, normal (lowest T score of -0.2   Lipid panel:  Allergies  Allergen Reactions  . Ace Inhibitors Cough  . Metronidazole Nausea Only  . Morphine And Related Itching  . Oxycodone-Acetaminophen Nausea Only  . Septra [Sulfamethoxazole-Trimethoprim] Nausea Only  . Sulfa Antibiotics Nausea Only  . Valium [Diazepam] Other (See Comments)    depression    Current Outpatient Prescriptions  Medication Sig Dispense Refill  . ALPRAZolam (XANAX) 0.5 MG tablet Take 0.5 mg by mouth. 1/2 as needed     . DULoxetine (CYMBALTA) 60 MG capsule Take 60 mg by mouth daily.    . meloxicam (MOBIC) 15 MG tablet Take 15 mg  by mouth daily.    . metoprolol (TOPROL-XL)  100 MG 24 hr tablet Take 100 mg by mouth daily.      [name] Kitchen omeprazole (PRILOSEC OTC) 20 MG tablet Take 20 mg by mouth daily.      . valsartan-hydrochlorothiazide (DIOVAN-HCT) 320-25 MG per tablet Take 1 tablet by mouth daily.    [name] Kitchen VITAMIN D, CHOLECALCIFEROL, PO Take 200 Units by mouth daily.     [name] Kitchen anastrozole (ARIMIDEX) 1 MG tablet Take 1 tablet (1 mg total) by mouth daily. 30 tablet 2  . OMEGA-3 1000 MG CAPS Take 1 capsule by mouth daily.       No current facility-administered medications for this visit.    OBJECTIVE: Older white woman who appears stated age 74 Vitals:   02/10/15 1101  BP: 163/94  Pulse: 98  Temp: 97.6 F (36.4 C)  Resp: 18     Body mass index is 45 kg/(m^2).    ECOG FS:1 - Symptomatic but completely ambulatory  Skin: warm, dry  HEENT: sclerae anicteric, conjunctivae pink, oropharynx clear. No thrush or mucositis.  Lymph Nodes: No cervical or supraclavicular lymphadenopathy  Lungs: clear to auscultation bilaterally, no rales, wheezes, or rhonci  Heart: regular rate and rhythm  Abdomen: obese, soft, non tender, positive bowel sounds  Musculoskeletal: No focal spinal tenderness, no peripheral edema  Neuro: non focal, well oriented, positive affect  Breasts: left breast status post lumpectomy and radiation. No evidence of recurrent disease. Left axilla benign. Right breast unremarkable.   LAB RESULTS:  CMP     Component Value Date/Time   NA 141 02/03/2015 1201   NA 141 01/18/2013 2016   K 3.5 02/03/2015 1201   K 4.0 01/18/2013 2016   CL 101 01/18/2013 2016   CO2 25 02/03/2015 1201   CO2 31 01/18/2013 2016   GLUCOSE 158* 02/03/2015 1201   GLUCOSE 103* 01/18/2013 2016   BUN 19.9 02/03/2015 1201   BUN 20 01/18/2013 2016   CREATININE 0.9 02/03/2015 1201   CREATININE 0.90 01/18/2013 2016   CALCIUM 9.6 02/03/2015 1201   CALCIUM 9.8 01/18/2013 2016   PROT 6.9 02/03/2015 1201   ALBUMIN 3.4* 02/03/2015 1201   AST  43* 02/03/2015 1201   ALT 46 02/03/2015 1201   ALKPHOS 82 02/03/2015 1201   BILITOT 0.40 02/03/2015 1201   GFRNONAA 63* 01/18/2013 2016   GFRAA 73* 01/18/2013 2016    I No results found for: SPEP  Lab Results  Component Value Date   WBC 8.1 02/03/2015   NEUTROABS 4.1 02/03/2015   HGB 13.0 02/03/2015   HCT 39.2 02/03/2015   MCV 89.9 02/03/2015   PLT 233 02/03/2015      Chemistry      Component Value Date/Time   NA 141 02/03/2015 1201   NA 141 01/18/2013 2016   K 3.5 02/03/2015 1201   K 4.0 01/18/2013 2016   CL 101 01/18/2013 2016   CO2 25 02/03/2015 1201   CO2 31 01/18/2013 2016   BUN 19.9 02/03/2015 1201   BUN 20 01/18/2013 2016   CREATININE 0.9 02/03/2015 1201   CREATININE 0.90 01/18/2013 2016      Component Value Date/Time   CALCIUM 9.6 02/03/2015 1201   CALCIUM 9.8 01/18/2013 2016   ALKPHOS 82 02/03/2015 1201   AST 43* 02/03/2015 1201   ALT 46 02/03/2015 1201   BILITOT 0.40 02/03/2015 1201       No results found for: LABCA2  No components found for: LABCA125  No results for input(s): INR in the last 168 hours.  Urinalysis No  results found for: COLORURINE   STUDIES: No results found. EXAM: DIGITAL DIAGNOSTIC right breast MAMMOGRAM WITH 3D TOMOSYNTHESIS WITH CAD  ULTRASOUND right BREAST  COMPARISON: 01/23/2014, 01/03/2013, 12/29/2011.  ACR Breast Density Category b: There are scattered areas of fibroglandular density.  FINDINGS: There is a stable right breast parenchymal pattern. The area of superficial palpable nodularity is located inferior to the right inframammary fold and is not included on the mammogram.  Mammographic images were processed with CAD.  On physical exam, there is a small, superficial, palpable nodule consistent with a sebaceous cyst located within the lateral inframammary fold region of the right breast at the 8 o'clock position.  Targeted ultrasound is performed, showing a dermal and subdermal hypoechoic  nodule located within the lateral inframammary fold region of the right breast at the 8 o'clock position corresponding to the palpable abnormality. This measures 5 x 4 x 4 mm in size and is associated with a sinus tract to the skin consistent with a small sebaceous cyst.  IMPRESSION: Findings are consistent with a 5 mm sebaceous cyst located within the right lateral inframammary fold region at the 8 o'clock position. No findings worrisome for malignancy.  RECOMMENDATION: Bilateral diagnostic mammography in May, 2016.  I have discussed the findings and recommendations with the patient. Results were also provided in writing at the conclusion of the visit. If applicable, a reminder letter will be sent to the patient regarding the next appointment.  BI-RADS CATEGORY 2: Benign.  ASSESSMENT: 74 y.o.  Candice Reid woman status post left breast biopsy 02/14/2014 for a clinical T1b N0, stage IA invasive ductal carcinoma, grade 2, estrogen and progesterone receptor positive, with a low MIB-1-at 10%, and HER-2 results negative  (1) status post left lumpectomy and sentinel lymph node sampling 03/11/2014 for a pT1c pN0, Stage IA invasive ductal carcinoma, grade 2, repeat HER-2 again negative, with negative margins  (2) Adjuvant radiation completed 05/14/2014 [44 Gy at 2 Gy per fraction x 22 fractions to the left breast]  (3) started anastrozole 06/27/2014  (a) bone density 10/01/2013 was normal   PLAN: Sergio is doing well today. The labs were reviewed in detail and were relatively stable. She is tolerating the anastrozole generally well, and will continue this drug for 5 years of antiestrogen therapy. Her next bone density scan will be due in January 2017.   For her vaginal dryness she will try water based lubricants or coconut oil.   Courtenay will return in 6 months for labs and a follow up visit. She understands and agrees with this plan. She knows the goal of treatment in her case is cure.  She has been encouraged to call with any issues that might arise before her next visit here.   Laurie Panda, NP   02/10/2015 11:50 AM

## 2015-02-10 NOTE — Telephone Encounter (Signed)
Spoke with patient and she is aware of her mammo

## 2015-02-18 ENCOUNTER — Ambulatory Visit
Admission: RE | Admit: 2015-02-18 | Discharge: 2015-02-18 | Disposition: A | Discharge status: Home / Self Care | Payer: Medicare Other | Source: Ambulatory Visit | Attending: Nurse Practitioner | Admitting: Nurse Practitioner

## 2015-02-18 DIAGNOSIS — C50312 Malignant neoplasm of lower-inner quadrant of left female breast: Secondary | ICD-10-CM

## 2015-03-19 ENCOUNTER — Encounter: Payer: Self-pay | Admitting: Oncology

## 2015-03-20 NOTE — Telephone Encounter (Signed)
Contacted pt re: message 6/22.  Pt reports she has already spoken with someone who told her it was ok to hold arimidex for a few weeks.  No note on the chart.  Patient could not remember who she spoke with.  Asked pt to call in a few weeks and let us know the results.  Pt agreed and voiced understanding.

## 2015-03-24 ENCOUNTER — Other Ambulatory Visit: Payer: Self-pay

## 2015-04-12 ENCOUNTER — Encounter: Payer: Self-pay | Admitting: Oncology

## 2015-04-24 ENCOUNTER — Encounter: Payer: Self-pay | Admitting: Gastroenterology

## 2015-05-01 DIAGNOSIS — M1990 Unspecified osteoarthritis, unspecified site: Secondary | ICD-10-CM | POA: Insufficient documentation

## 2015-05-06 ENCOUNTER — Encounter: Payer: Self-pay | Admitting: Gastroenterology

## 2015-05-06 ENCOUNTER — Ambulatory Visit (INDEPENDENT_AMBULATORY_CARE_PROVIDER_SITE_OTHER): Payer: Medicare Other | Admitting: Gastroenterology

## 2015-05-06 VITALS — BP 120/88 | Ht 64.0 in | Wt 251.4 lb

## 2015-05-06 DIAGNOSIS — Z87442 Personal history of urinary calculi: Secondary | ICD-10-CM | POA: Diagnosis not present

## 2015-05-06 DIAGNOSIS — R1031 Right lower quadrant pain: Secondary | ICD-10-CM | POA: Insufficient documentation

## 2015-05-06 DIAGNOSIS — K625 Hemorrhage of anus and rectum: Secondary | ICD-10-CM | POA: Diagnosis not present

## 2015-05-06 MED ORDER — HYDROCORTISONE 2.5 % RE CREA: 1.0000 "application " | TOPICAL_CREAM | Freq: Two times a day (BID) | RECTAL | Status: DC

## 2015-05-06 MED ORDER — HYDROCORTISONE ACETATE 25 MG RE SUPP: 25.0000 mg | Freq: Two times a day (BID) | RECTAL | Status: DC

## 2015-05-06 NOTE — Addendum Note (Signed)
Addended by: Gerlean Ren on: 05/06/2015 09:56 AM   Modules accepted: Orders, Medications

## 2015-05-06 NOTE — Patient Instructions (Addendum)
We have sent the following medications to your pharmacy for you to pick up at your convenience: Anusol  You have been scheduled for a CT scan of the abdomen and pelvis at [facility] ([address]---this is in the same building as [facility]).   You are scheduled on 05-09-15 at 9:00am. You should arrive 15 minutes prior to your appointment time for registration. Please follow the written instructions below on the day of your exam:  WARNING: IF YOU ARE ALLERGIC TO IODINE/X-RAY DYE, PLEASE NOTIFY RADIOLOGY IMMEDIATELY AT [phone]! YOU WILL BE GIVEN A 13 HOUR PREMEDICATION PREP.  1) Do not eat or drink anything after  (4 hours prior to your test) 2) You have been given 2 bottles of oral contrast to drink. The solution may taste better if refrigerated, but do NOT add ice or any other liquid to this solution. Shake well before drinking.    Drink 1 bottle of contrast @ 7:00am (2 hours prior to your exam)  Drink 1 bottle of contrast @  8:00am (1 hour prior to your exam)  You may take any medications as prescribed with a small amount of water except for the following: Metformin, Glucophage, Glucovance, Avandamet, Riomet, Fortamet, Actoplus Met, Janumet, Glumetza or Metaglip. The above medications must be held the day of the exam AND 48 hours after the exam.  The purpose of you drinking the oral contrast is to aid in the visualization of your intestinal tract. The contrast solution may cause some diarrhea. Before your exam is started, you will be given a small amount of fluid to drink. Depending on your individual set of symptoms, you may also receive an intravenous injection of x-ray contrast/dye. Plan on being at [facility] for 30 minutes or long, depending on the type of exam you are having performed.  This test typically takes 30-45 minutes to complete.  If you have any questions regarding your exam or if you need to reschedule, you may call the CT department at  [phone] between the hours of 8:00 am and 5:00 pm, Monday-Friday.  ________________________________________________________________________  Your physician has requested that you go to the basement for the following lab work before leaving today: BUN/Creatinine   Please follow up with Dr. Carlean Purl on 07-01-15 at 11:00

## 2015-05-06 NOTE — Progress Notes (Signed)
05/06/2015 Candice Reid [id number] Feb 09, 1941   HISTORY OF PRESENT ILLNESS:  This is a pleasant 74 year old female who is previously known to Candice Reid for colonoscopy in 02/2011.  At that time she was found to have diverticulosis in the sigmoid colon and internal hemorrhoids.  She is here today as a referral from her PCP, Candice Reid, for complaints of rectal bleeding an right lower quadrant abdominal pain.  She says that the bleeding is really the same that it had been prior to her last colonoscopy.  Described as bright red blood occurring in small amounts a couple times per week.  Sees it on the toilet paper and sometimes small amounts in her undergarments.  Her Hgb is normal at 13.1 grams on 03/26/2015.  She denies any significant constipation or diarrhea.  Her other complaint is the right lower quadrant abdominal pain.  She says that it comes and goes, lasting sometimes a couple of days at a time before resolving.  Says that it gets quite severe at times.  Radiates to the right side of her back.  Has a history of kidney stones.  CBC and CMP normal.     Past Medical History  Diagnosis Date  . Asthma   . Kidney stones   . Arthritis   . Hypertension   . Depression   . Hemorrhoids   . Hyperlipemia   . Anxiety                                                                                                                                                                                                                                                                  . GERD (gastroesophageal reflux disease)   . Obesity   . Hot flashes   . Phobia   . Complication of anesthesia     shaky-hard to wake up  . Wears glasses     reading  . Dental bridge present   . Breast cancer   . Radiation 05/14/14    44 Gy Left breast  . HTN (hypertension)   . Neuropathy   . Plantar fasciitis   . Osteoarthritis bilteral knees  . Obstructive sleep apnea   . Fatty liver   . Diabetes  Past Surgical  History  Procedure Laterality Date  . Knee surgery Left     x 2-left  . Colonoscopy  03/11/2011    diverticulosis, internal hemorrhoids  . Breast lumpectomy  1985    left-neg  . Dilation and curettage of uterus    . Cystoscopy w/ ureteral stent placement      removed  . Vaginal hysterectomy  1988    endometriosis, fibroids  . Oophorectomy Bilateral   . Breast lumpectomy  2015    left    reports that she has never smoked. She has never used smokeless tobacco. She reports that she does not drink alcohol or use illicit drugs. family history includes Arthritis in an other family member; Brain cancer in her brother and sister; Colon polyps in her maternal aunt and sister; Diabetes in her mother; Lung cancer in her sister; Ovarian cancer in her sister; Prostate cancer in her father; Renal Disease in her sister. There is no history of Colon cancer. Allergies  Allergen Reactions  . Ace Inhibitors Cough  . Metronidazole Nausea Only  . Morphine And Related Itching  . Oxycodone-Acetaminophen Nausea Only  . Septra [Sulfamethoxazole-Trimethoprim] Nausea Only  . Sulfa Antibiotics Nausea Only  . Valium [Diazepam] Other (See Comments)    depression      Outpatient Encounter Prescriptions as of 05/06/2015  Medication Sig  . ALPRAZolam (XANAX) 0.5 MG tablet Take 0.5 mg by mouth. 1/2 as needed   . anastrozole (ARIMIDEX) 1 MG tablet Take 1 tablet (1 mg total) by mouth daily.  . DULoxetine (CYMBALTA) 60 MG capsule Take 60 mg by mouth daily.  . metoprolol (TOPROL-XL) 100 MG 24 hr tablet Take 100 mg by mouth daily.    . OMEGA-3 1000 MG CAPS Take 1 capsule by mouth daily.    [name] Kitchen omeprazole (PRILOSEC OTC) 20 MG tablet Take 20 mg by mouth daily.    . valsartan-hydrochlorothiazide (DIOVAN-HCT) 320-25 MG per tablet Take 1 tablet by mouth daily.  [name] Kitchen VITAMIN D, CHOLECALCIFEROL, PO Take 200 Units by mouth daily.   . [DISCONTINUED] meloxicam (MOBIC) 15 MG tablet Take 15 mg by mouth daily.   No  facility-administered encounter medications on file as of 05/06/2015.     REVIEW OF SYSTEMS  : All other systems reviewed and negative except where noted in the History of Present Illness.   PHYSICAL EXAM: BP 120/88 mmHg  Ht 5\' 4"  (1.626 m)  Wt 251 lb 6 oz (114.023 kg)  BMI 43.13 kg/m2 General: Well developed white female in no acute distress Head: Normocephalic and atraumatic Eyes:  Sclerae anicteric, conjunctiva pink. Ears: Normal auditory acuity Lungs: Clear throughout to auscultation Heart: Regular rate and rhythm Abdomen: Soft, non-distended.  Normal bowel sounds.  Non-tender. Rectal:  Moderate sized external hemorrhoids noted.  No masses or pain noted on DRE.  No stool on exam glove but gel did test positive for blood.   Musculoskeletal: Symmetrical with no gross deformities  Skin: No lesions on visible extremities Extremities: No edema  Neurological: Alert oriented x 4, grossly non-focal Psychological:  Alert and cooperative. Normal mood and affect  ASSESSMENT AND PLAN: -Rectal bleeding:  Has been occurring intermittently since prior to colonoscopy in 2012.  Has large external hemorrhoids on exam today and internal hemorrhoids on last colonoscopy.  Hgb is normal.  Will treat with hydrocortisone suppositories twice daily for 14 days.   -RLQ abdominal pain:  Radiates to right side of back.  Has history of kidney stones.  Will order CT scan  of the abdomen and pelvis with contrast.  Want to evaluate other GI anatomy as well.  *Will follow-up results of CT scan and results from using suppositories.  Will bring her back for follow-up in about 6 weeks.  ? Need for repeat colonoscopy vs internal hemorrhoid banding, etc.  CC:  Candice Dus, MD

## 2015-05-07 ENCOUNTER — Other Ambulatory Visit (INDEPENDENT_AMBULATORY_CARE_PROVIDER_SITE_OTHER): Payer: Medicare Other

## 2015-05-07 DIAGNOSIS — R1031 Right lower quadrant pain: Secondary | ICD-10-CM | POA: Diagnosis not present

## 2015-05-07 DIAGNOSIS — Z87442 Personal history of urinary calculi: Secondary | ICD-10-CM

## 2015-05-07 DIAGNOSIS — K625 Hemorrhage of anus and rectum: Secondary | ICD-10-CM

## 2015-05-07 LAB — BUN: BUN: 31 mg/dL — AB (ref 6–23)

## 2015-05-07 LAB — CREATININE, SERUM: CREATININE: 1.11 mg/dL (ref 0.40–1.20)

## 2015-05-09 ENCOUNTER — Ambulatory Visit (INDEPENDENT_AMBULATORY_CARE_PROVIDER_SITE_OTHER)
Admission: RE | Admit: 2015-05-09 | Discharge: 2015-05-09 | Disposition: A | Discharge status: Home / Self Care | Payer: Medicare Other | Source: Ambulatory Visit | Attending: Gastroenterology | Admitting: Gastroenterology

## 2015-05-09 DIAGNOSIS — Z87442 Personal history of urinary calculi: Secondary | ICD-10-CM | POA: Diagnosis not present

## 2015-05-09 DIAGNOSIS — R1031 Right lower quadrant pain: Secondary | ICD-10-CM | POA: Diagnosis not present

## 2015-05-09 DIAGNOSIS — K625 Hemorrhage of anus and rectum: Secondary | ICD-10-CM

## 2015-05-09 MED ORDER — IOHEXOL 300 MG/ML  SOLN
100.0000 mL | Freq: Once | INTRAMUSCULAR | Status: AC | PRN
  Administered 2015-05-09: 100 mL via INTRAVENOUS

## 2015-05-15 ENCOUNTER — Encounter: Payer: Self-pay | Admitting: Gastroenterology

## 2015-05-17 NOTE — Progress Notes (Signed)
Agree with Ms. Alphia Kava management.  Gatha Mayer, MD, [facility] CT scan unrevealing Agree w/ f/u plans GU and me in Oct

## 2015-06-16 ENCOUNTER — Telehealth: Payer: Self-pay | Admitting: Internal Medicine

## 2015-06-16 MED ORDER — HYDROCORTISONE ACETATE 25 MG RE SUPP: 25.0000 mg | Freq: Two times a day (BID) | RECTAL | Status: DC

## 2015-06-16 NOTE — Telephone Encounter (Signed)
Spoke with pharmacist at [facility] and he said the rx on 05/06/2015 came thru as a cream not a suppository , have re-sent rx. For the suppository as Alonza Bogus PA-C ordered. Patient informed that rx sent in as requested.

## 2015-07-01 ENCOUNTER — Ambulatory Visit: Payer: Medicare Other | Admitting: Internal Medicine

## 2015-08-04 ENCOUNTER — Other Ambulatory Visit (HOSPITAL_BASED_OUTPATIENT_CLINIC_OR_DEPARTMENT_OTHER): Payer: Medicare Other

## 2015-08-04 DIAGNOSIS — C50312 Malignant neoplasm of lower-inner quadrant of left female breast: Secondary | ICD-10-CM | POA: Diagnosis not present

## 2015-08-04 LAB — CBC WITH DIFFERENTIAL/PLATELET
BASO%: 0.9 % (ref 0.0–2.0)
BASOS ABS: 0.1 10*3/uL (ref 0.0–0.1)
EOS ABS: 0.2 10*3/uL (ref 0.0–0.5)
EOS%: 2.6 % (ref 0.0–7.0)
HCT: 39.5 % (ref 34.8–46.6)
HEMOGLOBIN: 13.2 g/dL (ref 11.6–15.9)
LYMPH%: 33.1 % (ref 14.0–49.7)
MCH: 29.8 pg (ref 25.1–34.0)
MCHC: 33.4 g/dL (ref 31.5–36.0)
MCV: 89.3 fL (ref 79.5–101.0)
MONO#: 0.9 10*3/uL (ref 0.1–0.9)
MONO%: 9.5 % (ref 0.0–14.0)
NEUT#: 4.8 10*3/uL (ref 1.5–6.5)
NEUT%: 53.9 % (ref 38.4–76.8)
Platelets: 256 10*3/uL (ref 145–400)
RBC: 4.43 10*6/uL (ref 3.70–5.45)
RDW: 13.6 % (ref 11.2–14.5)
WBC: 9 10*3/uL (ref 3.9–10.3)
lymph#: 3 10*3/uL (ref 0.9–3.3)

## 2015-08-04 LAB — COMPREHENSIVE METABOLIC PANEL (CC13)
ALBUMIN: 3.4 g/dL — AB (ref 3.5–5.0)
ALK PHOS: 93 U/L (ref 40–150)
ALT: 56 U/L — ABNORMAL HIGH (ref 0–55)
AST: 43 U/L — ABNORMAL HIGH (ref 5–34)
Anion Gap: 10 mEq/L (ref 3–11)
BUN: 22.8 mg/dL (ref 7.0–26.0)
CO2: 26 mEq/L (ref 22–29)
Calcium: 9.9 mg/dL (ref 8.4–10.4)
Chloride: 104 mEq/L (ref 98–109)
Creatinine: 0.9 mg/dL (ref 0.6–1.1)
EGFR: 66 mL/min/{1.73_m2} — AB (ref >90)
Glucose: 93 mg/dl (ref 70–140)
Potassium: 3.6 mEq/L (ref 3.5–5.1)
SODIUM: 140 meq/L (ref 136–145)
Total Bilirubin: 0.43 mg/dL (ref 0.20–1.20)
Total Protein: 7.2 g/dL (ref 6.4–8.3)

## 2015-08-11 ENCOUNTER — Ambulatory Visit (HOSPITAL_BASED_OUTPATIENT_CLINIC_OR_DEPARTMENT_OTHER): Payer: Medicare Other | Admitting: Oncology

## 2015-08-11 VITALS — BP 128/60 | HR 86 | Temp 97.5°F | Resp 18 | Ht 64.0 in | Wt 246.3 lb

## 2015-08-11 DIAGNOSIS — C50312 Malignant neoplasm of lower-inner quadrant of left female breast: Secondary | ICD-10-CM

## 2015-08-11 DIAGNOSIS — C50512 Malignant neoplasm of lower-outer quadrant of left female breast: Secondary | ICD-10-CM | POA: Diagnosis not present

## 2015-08-11 DIAGNOSIS — K760 Fatty (change of) liver, not elsewhere classified: Secondary | ICD-10-CM

## 2015-08-11 DIAGNOSIS — M25569 Pain in unspecified knee: Secondary | ICD-10-CM

## 2015-08-11 DIAGNOSIS — Z170 Estrogen receptor positive status [ER+]: Secondary | ICD-10-CM

## 2015-08-11 DIAGNOSIS — E119 Type 2 diabetes mellitus without complications: Secondary | ICD-10-CM

## 2015-08-11 MED ORDER — ANASTROZOLE 1 MG PO TABS: 1.0000 mg | ORAL_TABLET | Freq: Every day | ORAL | Status: DC

## 2015-08-11 NOTE — Progress Notes (Signed)
Candice Reid  [phone] [phone]     ID: Candice Reid OB: 10-28-1940  MR#: [id number]  EXB#:[id number]  PCP: Candice Austria, MD GYN:  Candice Reid SU:  Candice Reid OTHER MD: Candice Reid  CHIEF COMPLAINT: Estrogen receptor positive early-stage breast cancer  CURRENT TREATMENT: Anastrozole  BREAST CANCER HISTORY: From the original intake note:   That he had routine screening mammography may 2015 showing a possible mass in the left breast. On 02/08/2014 she had left diagnostic mammography and ultrasonography of the breast Center, showing breast density category B. Otherwise a 1.1 cm ovoid mass in the inner lower quadrant of the left breast. Ultrasound confirmed an irregularly shaped mass measuring 1.0 cm. Ultrasound of the left axilla was unremarkable.  Biopsy of the left breast mass in question 02/14/2014 showed ([id number]) and invasive ductal carcinoma, grade 2, estrogen and progesterone receptor positive, with an MIB-1 of 10%. HER-2 is pending.  The patient's subsequent history is as detailed below  INTERVAL HISTORY: Candice Reid returns today for follow up of her estrogen receptor positive breast cancer. She continues on anastrozole. She doesn't think her hot flashes ever got any worse after starting the medication. She never developed the arthralgias and myalgias that some patients can develop on it. She obtains it for about $2 a month.   REVIEW OF SYSTEMS: Candice Reid's big problem is that she can barely walk. She was on mobile for a while then on another medication but it was beginning to affect her kidneys so she is been taken off that she is taking some Tylenol here in there. The problem is so bad that she can't walk to the swimming pool to do water aerobics, so she is doing no exercises at present. She did see Candice Reid sometime in the past and he tried a couple of shots, and but she has been going to a chiropractor more recently with  very little relief.  Also she had some abdominal pain in August. This was evaluated with a CT scan of the abdomen and pelvis which showed no obvious problem. It did show hepatic steatosis. She had a colonoscopy in 2012.--Aside from all these issues she bruises easily, feels anxious and depressed. She tells me her diabetes is well controlled. A detailed review of systems was otherwise stable.  PAST MEDICAL HISTORY: Past Medical History  Diagnosis Date  . Asthma   . Kidney stones   . Arthritis   . Hypertension   . Depression   . Hemorrhoids   . Hyperlipemia   . Anxiety                                                                                                                                                                                                                                                                  .  GERD (gastroesophageal reflux disease)   . Obesity   . Hot flashes   . Phobia   . Complication of anesthesia     shaky-hard to wake up  . Wears glasses     reading  . Dental bridge present   . Radiation 05/14/14    44 Gy Left breast  . HTN (hypertension)   . Neuropathy   . Plantar fasciitis   . Osteoarthritis bilteral knees  . Obstructive sleep apnea   . Fatty liver   . Diabetes   . Breast cancer 2015    PAST SURGICAL HISTORY: Past Surgical History  Procedure Laterality Date  . Knee surgery Left     x 2-left  . Colonoscopy  03/11/2011    diverticulosis, internal hemorrhoids  . Breast lumpectomy  1985    left-neg  . Dilation and curettage of uterus    . Cystoscopy w/ ureteral stent placement      removed  . Vaginal hysterectomy  1988    endometriosis, fibroids  . Oophorectomy Bilateral   . Breast lumpectomy  2015    left    FAMILY HISTORY Family History  Problem Relation Age of Onset  . Diabetes Mother   . Colon cancer Neg Hx   . Colon polyps Sister   . Lung cancer Sister   . Ovarian cancer Sister   . Brain cancer Sister   . Prostate  cancer Father   . Brain cancer Brother   . Colon polyps Maternal Aunt   . Renal Disease Sister   . Arthritis      Neice  The patient's father died from cancer, unknown type, at the age of 33. The patient's mother died from a stroke in the setting of diabetes at the age of 80. The patient had one brother, 3 sisters. One sister had lung cancer diagnosed at age 64. Another had brain cancer the age of 50. One brother also had brain cancer, at the age of 12. There is no history of breast or ovarian cancer in the family to the patient's knowledge  GYNECOLOGIC HISTORY:  Menarche age 13, first live birth age 52, the patient is Candice Reid P2. She is status post remote hysterectomy with bilateral salpingo-oophorectomy. She took hormone replacement approximately 20 years, stopping approximately 2010.   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 Her daughter Candice Booty "[name]"  [name] lives in [place] and works in Press photographer. The patient's son Candice Crumble "[name] " Reid also lives in [place], where he is a Airline pilot. The patient has 3 grandchildren. She attends [employer]    ADVANCED DIRECTIVES: Not in place. These were discussed at the initial visit 02/20/2014. The patient tells me she has the appropriate documents in hand and intends to name her daughter Candice Reid as healthcare power of attorney. Candice Reid can be reached at [place]: Social History  Substance Use Topics  . Smoking status: Never Smoker   . Smokeless tobacco: Never Used  . Alcohol Use: No     Colonoscopy: 2013/ [facility]  PAP: Status post hysterectomy  Bone density:10/11/2013 at Dr. Noland Reid, normal (lowest T score of -0.2   Lipid panel:  Allergies  Allergen Reactions  . Ace Inhibitors Cough  . Metronidazole Nausea Only  . Morphine And Related Itching  . Oxycodone-Acetaminophen Nausea Only  . Septra  [Sulfamethoxazole-Trimethoprim] Nausea Only  . Sulfa Antibiotics Nausea Only  .  Valium [Diazepam] Other (See Comments)    depression    Current Outpatient Prescriptions  Medication Sig Dispense Refill  . ALPRAZolam (XANAX) 0.5 MG tablet Take 0.5 mg by mouth. 1/2 as needed     . anastrozole (ARIMIDEX) 1 MG tablet Take 1 tablet (1 mg total) by mouth daily. 30 tablet 2  . DULoxetine (CYMBALTA) 60 MG capsule Take 60 mg by mouth daily.    . hydrocortisone (ANUSOL-HC) 25 MG suppository Place 1 suppository (25 mg total) rectally every 12 (twelve) hours. For 14 days 28 suppository 2  . metoprolol (TOPROL-XL) 100 MG 24 hr tablet Take 100 mg by mouth daily.      . OMEGA-3 1000 MG CAPS Take 1 capsule by mouth daily.      [name] Kitchen omeprazole (PRILOSEC OTC) 20 MG tablet Take 20 mg by mouth daily.      . valsartan-hydrochlorothiazide (DIOVAN-HCT) 320-25 MG per tablet Take 1 tablet by mouth daily.    [name] Kitchen VITAMIN D, CHOLECALCIFEROL, PO Take 200 Units by mouth daily.      No current facility-administered medications for this visit.    OBJECTIVE: Older white woman who walks with difficulty Filed Vitals:   08/11/15 1400  BP: 128/60  Pulse: 86  Temp: 97.5 F (36.4 C)  Resp: 18     Body mass index is 42.26 kg/(m^2).    ECOG FS:2 - Symptomatic, <50% confined to bed  Sclerae unicteric, pupils round and equal Oropharynx clear and moist-- no thrush or other lesions No cervical or supraclavicular adenopathy Lungs no rales or rhonchi Heart regular rate and rhythm Abd soft, nontender, positive bowel sounds MSK no focal spinal tenderness, no upper extremity lymphedema Neuro: nonfocal, well oriented, appropriate affect Breasts: The right breast is unremarkable. The left breast is status post lumpectomy and radiation. There is no evidence of local recurrence. The left axilla is benign.  LAB RESULTS:  CMP     Component Value Date/Time   NA 140 08/04/2015 1134   NA 141 01/18/2013 2016   K 3.6 08/04/2015 1134     K 4.0 01/18/2013 2016   CL 101 01/18/2013 2016   CO2 26 08/04/2015 1134   CO2 31 01/18/2013 2016   GLUCOSE 93 08/04/2015 1134   GLUCOSE 103* 01/18/2013 2016   BUN 22.8 08/04/2015 1134   BUN 31* 05/07/2015 1337   CREATININE 0.9 08/04/2015 1134   CREATININE 1.11 05/07/2015 1337   CALCIUM 9.9 08/04/2015 1134   CALCIUM 9.8 01/18/2013 2016   PROT 7.2 08/04/2015 1134   ALBUMIN 3.4* 08/04/2015 1134   AST 43* 08/04/2015 1134   ALT 56* 08/04/2015 1134   ALKPHOS 93 08/04/2015 1134   BILITOT 0.43 08/04/2015 1134   GFRNONAA 63* 01/18/2013 2016   GFRAA 73* 01/18/2013 2016    I No results found for: SPEP  Lab Results  Component Value Date   WBC 9.0 08/04/2015   NEUTROABS 4.8 08/04/2015   HGB 13.2 08/04/2015   HCT 39.5 08/04/2015   MCV 89.3 08/04/2015   PLT 256 08/04/2015      Chemistry      Component Value Date/Time   NA 140 08/04/2015 1134   NA 141 01/18/2013 2016   K 3.6 08/04/2015 1134   K 4.0 01/18/2013 2016   CL 101 01/18/2013 2016   CO2 26 08/04/2015 1134   CO2 31 01/18/2013 2016   BUN 22.8 08/04/2015 1134   BUN 31* 05/07/2015 1337   CREATININE 0.9 08/04/2015 1134   CREATININE 1.11 05/07/2015 1337  Component Value Date/Time   CALCIUM 9.9 08/04/2015 1134   CALCIUM 9.8 01/18/2013 2016   ALKPHOS 93 08/04/2015 1134   AST 43* 08/04/2015 1134   ALT 56* 08/04/2015 1134   BILITOT 0.43 08/04/2015 1134       No results found for: LABCA2  No components found for: LABCA125  No results for input(s): INR in the last 168 hours.  Urinalysis No results found for: COLORURINE   STUDIES:  CLINICAL DATA: Right lower quadrant pain for several weeks. Bright red blood per rectum. History of left breast cancer with lumpectomy in 2015.  EXAM: CT ABDOMEN AND PELVIS WITH CONTRAST  TECHNIQUE: Multidetector CT imaging of the abdomen and pelvis was performed using the standard protocol following bolus administration of intravenous contrast.  CONTRAST: 134m  OMNIPAQUE IOHEXOL 300 MG/ML SOLN  COMPARISON: 05/27/2010 stone study. Ultrasound of 04/23/2015.  FINDINGS: Lower chest: Clear lung bases. Normal heart size without pericardial or pleural effusion.  Hepatobiliary: Moderate hepatic steatosis. More focal steatosis in the lateral segment left liver lobe. Mild hepatomegaly, 18.4 cm craniocaudal. Normal gallbladder, without biliary ductal dilatation.  Pancreas: Normal, without mass or ductal dilatation.  Spleen: Normal  Adrenals/Urinary Tract: Normal left adrenal gland. A right adrenal nodule is unchanged at 2.1 cm, consistent with an adenoma on prior noncontrast study. 9 mm lower pole right renal cyst. Too small to characterize lesions in both kidneys. No hydronephrosis. Normal ureters and urinary bladder.  Stomach/Bowel: Normal stomach, without wall thickening. Scattered colonic diverticula. Normal terminal ileum and appendix. Normal small bowel.  Vascular/Lymphatic: Aortic and branch vessel atherosclerosis. Retroaortic left renal vein. No abdominopelvic adenopathy.  Reproductive: Hysterectomy. No adnexal mass.  Other: No significant free fluid.  Musculoskeletal: Convex right lumbar spine curvature. Advanced lumbosacral spondylosis.  IMPRESSION: 1. No acute process or explanation for right lower quadrant pain. 2. Hepatic steatosis and hepatomegaly. 3. Right adrenal adenoma.   Electronically Signed  By: KAbigail MiyamotoM.D.  On: 05/09/2015 09:32      ASSESSMENT: 74y.o.  [place] woman status post left breast biopsy 02/14/2014 for a clinical T1b N0, stage IA invasive ductal carcinoma, grade 2, estrogen and progesterone receptor positive, with a low MIB-1-at 10%, and HER-2 results negative  (1) status post left lumpectomy and sentinel lymph node sampling 03/11/2014 for a pT1c pN0, Stage IA invasive ductal carcinoma, grade 2, repeat HER-2 again negative, with negative margins  (2) Adjuvant radiation  completed 05/14/2014 [44 Gy at 2 Gy per fraction x 22 fractions to the left breast]  (3) started anastrozole 06/27/2014  (a) bone density 10/01/2013 was normal   PLAN: PAdannais 1-1/2 years out from her definitive surgery with no evidence of disease recurrence. This is very favorable.  She is tolerating the anastrozole well, and obtain sit at an excellent price. The plan is going to be to continue that drug for 5 years.  As far as her knees are concerned I reassured her that she could take the occasional Aleve for example if that would help her walk to the swimming pool were then she could do water aerobic exercises. What would be of more concern is if she took it on a regular basis as she was doing because as her primary care physician told her that can damage the kidneys. I suspect she is going to need knee replacement. She is going to be discussing all this with Dr. RAlyson Inglesat her upcoming visit.  I reassured her that her fatty liver was not related to the anastrozole and that  it will improve if she is able to excercisea little more and lose a little weigh.   We are going to see her again in June of next year. Before that visit she will have a repeat bone density. If all is stable at that time we'll start seeing her on a once a year basis.  She knows to call for any problems that may develop before then.    Prudy Feeler, MD   08/11/2015 2:05 PM

## 2015-08-15 ENCOUNTER — Telehealth: Payer: Self-pay | Admitting: Internal Medicine

## 2015-08-15 ENCOUNTER — Ambulatory Visit: Payer: Medicare Other | Admitting: Internal Medicine

## 2015-08-15 NOTE — Telephone Encounter (Signed)
No charge per Dr. Gessner. 

## 2016-01-29 ENCOUNTER — Encounter: Payer: Self-pay | Admitting: Oncology

## 2016-01-29 ENCOUNTER — Other Ambulatory Visit: Payer: Self-pay | Admitting: Oncology

## 2016-01-29 DIAGNOSIS — Z9889 Other specified postprocedural states: Secondary | ICD-10-CM

## 2016-01-29 DIAGNOSIS — Z853 Personal history of malignant neoplasm of breast: Secondary | ICD-10-CM

## 2016-02-01 ENCOUNTER — Emergency Department (HOSPITAL_COMMUNITY)
Admission: EM | Admit: 2016-02-01 | Discharge: 2016-02-01 | Disposition: A | Discharge status: Home / Self Care | Payer: Medicare Other | Attending: Emergency Medicine | Admitting: Emergency Medicine

## 2016-02-01 ENCOUNTER — Emergency Department (HOSPITAL_COMMUNITY): Payer: Medicare Other

## 2016-02-01 DIAGNOSIS — Z853 Personal history of malignant neoplasm of breast: Secondary | ICD-10-CM | POA: Diagnosis not present

## 2016-02-01 DIAGNOSIS — F419 Anxiety disorder, unspecified: Secondary | ICD-10-CM | POA: Diagnosis not present

## 2016-02-01 DIAGNOSIS — F329 Major depressive disorder, single episode, unspecified: Secondary | ICD-10-CM | POA: Insufficient documentation

## 2016-02-01 DIAGNOSIS — E119 Type 2 diabetes mellitus without complications: Secondary | ICD-10-CM | POA: Insufficient documentation

## 2016-02-01 DIAGNOSIS — H8111 Benign paroxysmal vertigo, right ear: Secondary | ICD-10-CM

## 2016-02-01 DIAGNOSIS — E669 Obesity, unspecified: Secondary | ICD-10-CM | POA: Diagnosis not present

## 2016-02-01 DIAGNOSIS — Z79899 Other long term (current) drug therapy: Secondary | ICD-10-CM | POA: Diagnosis not present

## 2016-02-01 DIAGNOSIS — E785 Hyperlipidemia, unspecified: Secondary | ICD-10-CM | POA: Insufficient documentation

## 2016-02-01 DIAGNOSIS — J45909 Unspecified asthma, uncomplicated: Secondary | ICD-10-CM | POA: Insufficient documentation

## 2016-02-01 DIAGNOSIS — K219 Gastro-esophageal reflux disease without esophagitis: Secondary | ICD-10-CM | POA: Insufficient documentation

## 2016-02-01 DIAGNOSIS — I10 Essential (primary) hypertension: Secondary | ICD-10-CM | POA: Diagnosis not present

## 2016-02-01 DIAGNOSIS — Z923 Personal history of irradiation: Secondary | ICD-10-CM | POA: Diagnosis not present

## 2016-02-01 DIAGNOSIS — R42 Dizziness and giddiness: Secondary | ICD-10-CM | POA: Diagnosis present

## 2016-02-01 DIAGNOSIS — H81399 Other peripheral vertigo, unspecified ear: Secondary | ICD-10-CM | POA: Diagnosis not present

## 2016-02-01 DIAGNOSIS — H6121 Impacted cerumen, right ear: Secondary | ICD-10-CM | POA: Insufficient documentation

## 2016-02-01 DIAGNOSIS — Z87442 Personal history of urinary calculi: Secondary | ICD-10-CM | POA: Insufficient documentation

## 2016-02-01 DIAGNOSIS — M1990 Unspecified osteoarthritis, unspecified site: Secondary | ICD-10-CM | POA: Insufficient documentation

## 2016-02-01 LAB — PROTIME-INR
INR: 1.01 (ref 0.00–1.49)
PROTHROMBIN TIME: 13.5 s (ref 11.6–15.2)

## 2016-02-01 LAB — CBC
HEMATOCRIT: 40.8 % (ref 36.0–46.0)
Hemoglobin: 13.1 g/dL (ref 12.0–15.0)
MCH: 29.1 pg (ref 26.0–34.0)
MCHC: 32.1 g/dL (ref 30.0–36.0)
MCV: 90.7 fL (ref 78.0–100.0)
PLATELETS: 225 10*3/uL (ref 150–400)
RBC: 4.5 MIL/uL (ref 3.87–5.11)
RDW: 14.3 % (ref 11.5–15.5)
WBC: 8.6 10*3/uL (ref 4.0–10.5)

## 2016-02-01 LAB — I-STAT CHEM 8, ED
BUN: 16 mg/dL (ref 6–20)
CALCIUM ION: 1.13 mmol/L (ref 1.13–1.30)
CHLORIDE: 103 mmol/L (ref 101–111)
Creatinine, Ser: 0.6 mg/dL (ref 0.44–1.00)
GLUCOSE: 95 mg/dL (ref 65–99)
HCT: 43 % (ref 36.0–46.0)
HEMOGLOBIN: 14.6 g/dL (ref 12.0–15.0)
Potassium: 4 mmol/L (ref 3.5–5.1)
SODIUM: 141 mmol/L (ref 135–145)
TCO2: 24 mmol/L (ref 0–100)

## 2016-02-01 LAB — COMPREHENSIVE METABOLIC PANEL
ALK PHOS: 78 U/L (ref 38–126)
ALT: 32 U/L (ref 14–54)
ANION GAP: 12 (ref 5–15)
AST: 30 U/L (ref 15–41)
Albumin: 3.3 g/dL — ABNORMAL LOW (ref 3.5–5.0)
BUN: 12 mg/dL (ref 6–20)
CALCIUM: 9.7 mg/dL (ref 8.9–10.3)
CHLORIDE: 103 mmol/L (ref 101–111)
CO2: 25 mmol/L (ref 22–32)
CREATININE: 0.66 mg/dL (ref 0.44–1.00)
Glucose, Bld: 98 mg/dL (ref 65–99)
Potassium: 4.1 mmol/L (ref 3.5–5.1)
SODIUM: 140 mmol/L (ref 135–145)
Total Bilirubin: 0.7 mg/dL (ref 0.3–1.2)
Total Protein: 6.7 g/dL (ref 6.5–8.1)

## 2016-02-01 LAB — APTT: aPTT: 24 seconds (ref 24–37)

## 2016-02-01 LAB — DIFFERENTIAL
BASOS PCT: 0 %
Basophils Absolute: 0 10*3/uL (ref 0.0–0.1)
EOS PCT: 2 %
Eosinophils Absolute: 0.2 10*3/uL (ref 0.0–0.7)
Lymphocytes Relative: 31 %
Lymphs Abs: 2.7 10*3/uL (ref 0.7–4.0)
MONO ABS: 0.7 10*3/uL (ref 0.1–1.0)
MONOS PCT: 8 %
Neutro Abs: 5 10*3/uL (ref 1.7–7.7)
Neutrophils Relative %: 59 %

## 2016-02-01 LAB — I-STAT TROPONIN, ED: TROPONIN I, POC: 0 ng/mL (ref 0.00–0.08)

## 2016-02-01 LAB — ETHANOL

## 2016-02-01 MED ORDER — GADOPENTETATE DIMEGLUMINE 469.01 MG/ML IV SOLN
15.0000 mL | Freq: Once | INTRAVENOUS | Status: AC | PRN
  Administered 2016-02-01: 15 mL via INTRAVENOUS

## 2016-02-01 MED ORDER — LORAZEPAM 2 MG/ML IJ SOLN
1.0000 mg | Freq: Once | INTRAMUSCULAR | Status: AC
  Administered 2016-02-01: 1 mg via INTRAVENOUS

## 2016-02-01 MED ORDER — LORAZEPAM 2 MG/ML IJ SOLN
INTRAMUSCULAR | Status: AC
  Filled 2016-02-01: qty 1

## 2016-02-01 MED ORDER — MECLIZINE HCL 25 MG PO TABS: 25.0000 mg | ORAL_TABLET | Freq: Three times a day (TID) | ORAL | Status: DC | PRN

## 2016-02-01 MED ORDER — DOCUSATE SODIUM 50 MG/5ML PO LIQD
50.0000 mg | Freq: Once | ORAL | Status: AC
  Administered 2016-02-01: 50 mg via ORAL
  Filled 2016-02-01: qty 10

## 2016-02-01 NOTE — ED Notes (Signed)
MD at bedside. 

## 2016-02-01 NOTE — ED Notes (Signed)
[facility] doctor called? Stating patient coming cause of dizziness and headache for a week.  Pt. Stated, I've had a headache all week, but when I took a shower and raised back up I was dizzy. Pt took to room for doctor to evaluate Code Stroke. Called [place] to verify

## 2016-02-01 NOTE — ED Provider Notes (Signed)
CSN: [id number]     Arrival date & time 02/01/16  1029 History   First MD Initiated Contact with Patient 02/01/16 1041     Chief Complaint  Patient presents with  . Dizziness     (Consider location/radiation/quality/duration/timing/severity/associated sxs/prior Treatment) HPI Candice Reid is a 75 y.o. female history of hypertension, hyperlipidemia, diabetes, obesity, family history of CVA and heart disease, comes in for evaluation of dizziness. Patient reports she is felt dizzy constantly over the past one week. She reports while standing in the shower this morning at approximately 9:00 AM, she bent over and when she stood up she felt like "I was spinning". She sat down on a chair in her shower in the sensation resolved. She reports typically she will feel like the room is spinning when she lies flat, but the sensation today was different. She also reports a mild headache behind her left eye and cheek. She denies any other chest pain, shortness of breath, unusual numbness or weakness, difficulty swallowing or talking. No anticoagulation.  Past Medical History  Diagnosis Date  . Asthma   . Kidney stones   . Arthritis   . Hypertension   . Depression   . Hemorrhoids   . Hyperlipemia   . Anxiety                                                                                                                                                                                                                                                                  . GERD (gastroesophageal reflux disease)   . Obesity   . Hot flashes   . Phobia   . Complication of anesthesia     shaky-hard to wake up  . Wears glasses     reading  . Dental bridge present   . Radiation 05/14/14    44 Gy Left breast  . HTN (hypertension)   . Neuropathy   . Plantar fasciitis   . Osteoarthritis bilteral knees  . Obstructive sleep apnea   . Fatty liver   . Diabetes   . Breast cancer 2015   Past Surgical History   Procedure Laterality Date  . Knee surgery Left     x 2-left  . Colonoscopy  03/11/2011    diverticulosis,  internal hemorrhoids  . Breast lumpectomy  1985    left-neg  . Dilation and curettage of uterus    . Cystoscopy w/ ureteral stent placement      removed  . Vaginal hysterectomy  1988    endometriosis, fibroids  . Oophorectomy Bilateral   . Breast lumpectomy  2015    left   Family History  Problem Relation Age of Onset  . Diabetes Mother   . Colon cancer Neg Hx   . Colon polyps Sister   . Lung cancer Sister   . Ovarian cancer Sister   . Brain cancer Sister   . Prostate cancer Father   . Brain cancer Brother   . Colon polyps Maternal Aunt   . Renal Disease Sister   . Arthritis      [place]   Social History  Substance Use Topics  . Smoking status: Never Smoker   . Smokeless tobacco: Never Used  . Alcohol Use: No   OB History    No data available     Review of Systems A 10 point review of systems was completed and was negative except for pertinent positives and negatives as mentioned in the history of present illness     Allergies  Ace inhibitors; Metronidazole; Morphine and related; Oxycodone-acetaminophen; Septra; Sulfa antibiotics; and Valium  Home Medications   Prior to Admission medications   Medication Sig Start Date End Date Taking? Authorizing Provider  anastrozole (ARIMIDEX) 1 MG tablet Take 1 tablet (1 mg total) by mouth daily. 08/11/15  Yes Chauncey Cruel, MD  cholecalciferol (VITAMIN D) 1000 units tablet Take 1,000 Units by mouth daily.   Yes Historical Provider, MD  DULoxetine (CYMBALTA) 60 MG capsule Take 60 mg by mouth daily.   Yes Historical Provider, MD  HYDROcodone-acetaminophen (NORCO/VICODIN) 5-325 MG tablet Take 1 tablet by mouth every 6 (six) hours as needed for moderate pain.   Yes Historical Provider, MD  metoprolol (TOPROL-XL) 100 MG 24 hr tablet Take 100 mg by mouth daily.     Yes Historical Provider, MD  OMEGA-3 1000 MG CAPS Take  1 capsule by mouth daily.     Yes Historical Provider, MD  omeprazole (PRILOSEC OTC) 20 MG tablet Take 20 mg by mouth daily.     Yes Historical Provider, MD  pravastatin (PRAVACHOL) 40 MG tablet Take 40 mg by mouth every Monday, Wednesday, and Friday.   Yes Historical Provider, MD  valsartan-hydrochlorothiazide (DIOVAN-HCT) 320-25 MG per tablet Take 1 tablet by mouth daily.   Yes Maury Dus, MD  ALPRAZolam Duanne Moron) 0.5 MG tablet Take 0.5 mg by mouth 3 (three) times daily as needed for anxiety. 1/2 as needed    Historical Provider, MD  meclizine (ANTIVERT) 25 MG tablet Take 1 tablet (25 mg total) by mouth 3 (three) times daily as needed for dizziness. 02/01/16   Comer Locket, PA-C  VITAMIN D, CHOLECALCIFEROL, PO Take 200 Units by mouth daily.     Historical Provider, MD   BP 124/46 mmHg  Pulse 74  Temp(Src) 97.7 F (36.5 C) (Oral)  Resp 18  SpO2 97% Physical Exam  Constitutional: She is oriented to person, place, and time. She appears well-developed and well-nourished.  HENT:  Head: Normocephalic and atraumatic.  Mouth/Throat: Oropharynx is clear and moist.  Right TM cerumen impaction, left ear normal  Eyes: Conjunctivae and EOM are normal. Pupils are equal, round, and reactive to light. Right eye exhibits no discharge. Left eye exhibits no discharge. No scleral icterus.  No nystagmus  Neck: Neck supple.  Cardiovascular: Normal rate, regular rhythm and normal heart sounds.   Pulmonary/Chest: Effort normal and breath sounds normal. No respiratory distress. She has no wheezes. She has no rales.  Abdominal: Soft. There is no tenderness.  Musculoskeletal: She exhibits no tenderness.  Neurological: She is alert and oriented to person, place, and time.  Cranial Nerves II-XII grossly intact. Motor strength is 5/5 in all 4 extremities. Sensation is intact to light touch. Completes fine motor coordination movements without difficulty. She does seem to have some left sided pronator drift  Skin:  Skin is warm and dry. No rash noted.  Psychiatric: She has a normal mood and affect.  Nursing note and vitals reviewed.   ED Course  Procedures (including critical care time) Labs Review Labs Reviewed  COMPREHENSIVE METABOLIC PANEL - Abnormal; Notable for the following:    Albumin 3.3 (*)    All other components within normal limits  ETHANOL  PROTIME-INR  APTT  CBC  DIFFERENTIAL  URINE RAPID DRUG SCREEN, HOSP PERFORMED  URINALYSIS, ROUTINE W REFLEX MICROSCOPIC (NOT AT [facility])  I-STAT TROPOININ, ED  I-STAT CHEM 8, ED    Imaging Review Mr Brain Wo Contrast (neuro Protocol)  02/01/2016  CLINICAL DATA:  5 old hypertensive female with vertigo like symptoms. Initial encounter. EXAM: MRI HEAD WITHOUT CONTRAST TECHNIQUE: Multiplanar, multiecho pulse sequences of the brain and surrounding structures were obtained without intravenous contrast. COMPARISON:  None. FINDINGS: Sequences are motion degraded. No acute infarct or intracranial hemorrhage. No intracranial mass lesion noted on this unenhanced exam. Global atrophy without hydrocephalus. Major intracranial vascular structures are patent. Cervical medullary junction unremarkable. There may be mild spinal stenosis at the C4-5 level. No acute orbital abnormality detected. IMPRESSION: Motion degraded examination without evidence of acute infarct or intracranial hemorrhage. Global atrophy without hydrocephalus. Electronically Signed   By: Genia Del M.D.   On: 02/01/2016 13:53   I have personally reviewed and evaluated these images and lab results as part of my medical decision-making.   EKG Interpretation None      MDM  LESA PINGLEY is a 75 y.o. female  history of hypertension, hyperlipidemia, diabetes, obesity, family history of CVA and heart disease, comes in for evaluation of dizziness. Dizziness has been ongoing for the past one week, most recently at 9:00 this morning or bending over in the shower. On arrival, patient is  hemodynamically stable, afebrile. May have pronator drift on exam, otherwise nonfocal neuro exam. Discussed with my attending, Dr. Lita Mains. Low suspicion for acute stroke, will obtain MR brain Screening labs are unremarkable. MR brain is negative. Plan to consult neurology. Discussed with Dr. Leonel Ramsay, will see in ED. Symptoms seem to be consistent with peripheral vertigo. Patient to be discharged and follow-up outpatient. Final diagnoses:  Peripheral vertigo, unspecified laterality  Dizziness       Comer Locket, PA-C 02/01/16 1539  Julianne Rice, MD 02/04/16 [phone]

## 2016-02-01 NOTE — ED Notes (Signed)
Attempted to obtain clean catch urine, urine went into toilet. Given coke and will try again later.

## 2016-02-01 NOTE — ED Notes (Signed)
Patient complains of dizziness that started this morning while standing in the shower at 0900 today.  Patient states for the last week she has had moments where her "head feels like a blood vessel popped and had a moment of amnesia".  Also states she feels like her heart has been irregular, states she she has a history of irregular heart beat.  Patient alert and oriented at this time.

## 2016-02-01 NOTE — ED Notes (Signed)
Dr. Kirkpatrick at bedside 

## 2016-02-01 NOTE — ED Notes (Signed)
Body temperature NS and peroxide soaking in pts right ear at this time.

## 2016-02-01 NOTE — Discharge Instructions (Signed)
There does not appear to be an emergent cause for your symptoms at this time. Your exam, labs and imaging were all reassuring. Please take your medication as directed to help with dizziness. Follow-up with your doctor in the next 2 days for reevaluation. Return to ED for any new or worsening symptoms.  Dizziness Dizziness is a common problem. It is a feeling of unsteadiness or light-headedness. You may feel like you are about to faint. Dizziness can lead to injury if you stumble or fall. Anyone can become dizzy, but dizziness is more common in older adults. This condition can be caused by a number of things, including medicines, dehydration, or illness. HOME CARE INSTRUCTIONS Taking these steps may help with your condition: Eating and Drinking  Drink enough fluid to keep your urine clear or pale yellow. This helps to keep you from becoming dehydrated. Try to drink more clear fluids, such as water.  Do not drink alcohol.  Limit your caffeine intake if directed by your health care provider.  Limit your salt intake if directed by your health care provider. Activity  Avoid making quick movements.  Rise slowly from chairs and steady yourself until you feel okay.  In the morning, first sit up on the side of the bed. When you feel okay, stand slowly while you hold onto something until you know that your balance is fine.  Move your legs often if you need to stand in one place for a long time. Tighten and relax your muscles in your legs while you are standing.  Do not drive or operate heavy machinery if you feel dizzy.  Avoid bending down if you feel dizzy. Place items in your home so that they are easy for you to reach without leaning over. Lifestyle  Do not use any tobacco products, including cigarettes, chewing tobacco, or electronic cigarettes. If you need help quitting, ask your health care provider.  Try to reduce your stress level, such as with yoga or meditation. Talk with your health  care provider if you need help. General Instructions  Watch your dizziness for any changes.  Take medicines only as directed by your health care provider. Talk with your health care provider if you think that your dizziness is caused by a medicine that you are taking.  Tell a friend or a family member that you are feeling dizzy. If he or she notices any changes in your behavior, have this person call your health care provider.  Keep all follow-up visits as directed by your health care provider. This is important. SEEK MEDICAL CARE IF:  Your dizziness does not go away.  Your dizziness or light-headedness gets worse.  You feel nauseous.  You have reduced hearing.  You have new symptoms.  You are unsteady on your feet or you feel like the room is spinning. SEEK IMMEDIATE MEDICAL CARE IF:  You vomit or have diarrhea and are unable to eat or drink anything.  You have problems talking, walking, swallowing, or using your arms, hands, or legs.  You feel generally weak.  You are not thinking clearly or you have trouble forming sentences. It may take a friend or family member to notice this.  You have chest pain, abdominal pain, shortness of breath, or sweating.  Your vision changes.  You notice any bleeding.  You have a headache.  You have neck pain or a stiff neck.  You have a fever.   This information is not intended to replace advice given to you by  your health care provider. Make sure you discuss any questions you have with your health care provider.   Document Released: 03/09/2001 Document Revised: 01/28/2015 Document Reviewed: 09/09/2014 Elsevier Interactive Patient Education [employer].

## 2016-02-01 NOTE — Consult Note (Signed)
Neurology Consultation Reason for Consult: Vertigo Referring Physician: Lita Mains, D  CC: vertigo  History is obtained from:patient  HPI: Candice Reid is a 75 y.o. female who has had difficulties with vertigo on extreme upwards gaze for several years. Over the past couple of weeks, she has noticed that when leaning forwards, she also will sometimes precipitate vertigo as well. She has a particularly bad episode earlier today and therefore has sought treatment in the emergency room.   Here, an MRI was obtained which demonstrated no signs of acute infarct.    ROS: A 14 point ROS was performed and is negative except as noted in the HPI.   Past Medical History  Diagnosis Date  . Asthma   . Kidney stones   . Arthritis   . Hypertension   . Depression   . Hemorrhoids   . Hyperlipemia   . Anxiety                                                                                                                                                                                                                                                                  . GERD (gastroesophageal reflux disease)   . Obesity   . Hot flashes   . Phobia   . Complication of anesthesia     shaky-hard to wake up  . Wears glasses     reading  . Dental bridge present   . Radiation 05/14/14    44 Gy Left breast  . HTN (hypertension)   . Neuropathy   . Plantar fasciitis   . Osteoarthritis bilteral knees  . Obstructive sleep apnea   . Fatty liver   . Diabetes   . Breast cancer 2015     Family History  Problem Relation Age of Onset  . Diabetes Mother   . Colon cancer Neg Hx   . Colon polyps Sister   . Lung cancer Sister   . Ovarian cancer Sister   . Brain cancer Sister   . Prostate cancer Father   . Brain cancer Brother   . Colon polyps Maternal Aunt   . Renal Disease Sister   . Arthritis      Neice     Social History:  reports that she has never  smoked. She has never used smokeless tobacco.  She reports that she does not drink alcohol or use illicit drugs.   Exam: Current vital signs: BP 124/46 mmHg  Pulse 74  Temp(Src) 97.7 F (36.5 C) (Oral)  Resp 18  SpO2 97% Vital signs in last 24 hours: Temp:  [97.7 F (36.5 C)] 97.7 F (36.5 C) (05/07 1101) Pulse Rate:  [73-84] 74 (05/07 1400) Resp:  [18] 18 (05/07 1200) BP: (113-124)/(46-70) 124/46 mmHg (05/07 1400) SpO2:  [96 %-97 %] 97 % (05/07 1400)   Physical Exam  Constitutional: Appears obese Psych: Affect appropriate to situation Eyes: No scleral injection HENT: No OP obstrucion Head: Normocephalic.  Cardiovascular: Normal rate and regular rhythm.  Respiratory: Effort normal and breath sounds normal to anterior ascultation GI: Soft.  No distension. There is no tenderness.  Skin: WDI  Neuro: Mental Status: Patient is awake, alert, oriented to person, place, month, year, and situation. Patient is able to give a clear and coherent history. No signs of aphasia or neglect Cranial Nerves: II: Visual Fields are full. Pupils are equal, round, and reactive to light.   III,IV, VI: EOMI without ptosis or diploplia.  V: Facial sensation is symmetric to temperature VII: Facial movement is symmetric.  VIII: hearing is intact to voice X: Uvula elevates symmetrically XI: Shoulder shrug is symmetric. XII: tongue is midline without atrophy or fasciculations.  Motor: Tone is normal. Bulk is normal. 5/5 strength was present in all four extremities, she has some limitation in her left arm due to previous injury.  Sensory: Sensation is symmetric to light touch and temperature in the arms and legs. Cerebellar: FNF and HKS are intact bilaterally  She has only mild endgaze nystagmus at baseline, after dix-hallpike looking to the right, she has rotary nystagmus. On left ward gaze, no symptoms are precipitated with laying down, but she again has rotary nystagmus on sitting up.   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   Recommendations: 1) I have provided instructions for both the modified semont and modified epley maneuvers to the patient.  2) If no relief, can follow up as outpatient.    Roland Rack, MD [facility] [phone]  If 7pm- 7am, please page neurology on call as listed in [place].

## 2016-02-01 NOTE — ED Notes (Signed)
Administered 1MG  ativan in MRI

## 2016-02-02 ENCOUNTER — Other Ambulatory Visit: Payer: Self-pay | Admitting: *Deleted

## 2016-02-03 ENCOUNTER — Telehealth: Payer: Self-pay | Admitting: Oncology

## 2016-02-03 NOTE — Telephone Encounter (Signed)
Spoke with patient to confirm 6/28 appt at 11 am

## 2016-02-19 ENCOUNTER — Ambulatory Visit
Admission: RE | Admit: 2016-02-19 | Discharge: 2016-02-19 | Disposition: A | Discharge status: Home / Self Care | Payer: Medicare Other | Source: Ambulatory Visit | Attending: Oncology | Admitting: Oncology

## 2016-02-19 DIAGNOSIS — Z853 Personal history of malignant neoplasm of breast: Secondary | ICD-10-CM

## 2016-02-19 DIAGNOSIS — Z9889 Other specified postprocedural states: Secondary | ICD-10-CM

## 2016-03-23 ENCOUNTER — Other Ambulatory Visit: Payer: Self-pay | Admitting: *Deleted

## 2016-03-23 DIAGNOSIS — C50312 Malignant neoplasm of lower-inner quadrant of left female breast: Secondary | ICD-10-CM

## 2016-03-23 NOTE — Progress Notes (Signed)
Candice Reid  Telephone:([phone]) [phone] Fax:(336) [phone]     ID: Candice Reid OB: March 26, 1941  MR#: [id number]  NAT#:[id number]  PCP: Candice Austria, MD GYN:  Candice Reid SU:  Candice Reid OTHER MD: Candice Reid  CHIEF COMPLAINT: Estrogen receptor positive early-stage breast cancer  CURRENT TREATMENT: Anastrozole  BREAST CANCER HISTORY: From the original intake note:   That he had routine screening mammography may 2015 showing a possible mass in the left breast. On 02/08/2014 she had left diagnostic mammography and ultrasonography of the breast Center, showing breast density category B. Otherwise a 1.1 cm ovoid mass in the inner lower quadrant of the left breast. Ultrasound confirmed an irregularly shaped mass measuring 1.0 cm. Ultrasound of the left axilla was unremarkable.  Biopsy of the left breast mass in question 02/14/2014 showed ([id number]) and invasive ductal carcinoma, grade 2, estrogen and progesterone receptor positive, with an MIB-1 of 10%. HER-2 is pending.  The patient's subsequent history is as detailed below  INTERVAL HISTORY: Candice Reid returns today for follow up of her breast cancer. She tolerates anastrozole well, with some hot flashes but no night sweats. Vaginal dryness is not a concern. She does have pain, but this is not related to the drug. She obtains it at $4 per month.   REVIEW OF SYSTEMS: Candice Reid' continues to have significant pain. At this point it is difficult for her to walk across the street or from one end of the house to the other. She can't walk to the swimming pool to do water aerobics which is what she did previously. She is working with Dr. Ihor Reid on this and understands she will need to have surgery at some point. She is despairing of being able to lose any weight. She continues to have pain in her back is well. And feels anxious and depressed. A detailed review of systems today was otherwise stable  PAST MEDICAL  HISTORY: Past Medical History  Diagnosis Date  . Asthma   . Kidney stones   . Arthritis   . Hypertension   . Depression   . Hemorrhoids   . Hyperlipemia   . Anxiety                                                                                                                                                                                                                                                                  .  GERD (gastroesophageal reflux disease)   . Obesity   . Hot flashes   . Phobia   . Complication of anesthesia     shaky-hard to wake up  . Wears glasses     reading  . Dental bridge present   . Radiation 05/14/14    44 Gy Left breast  . HTN (hypertension)   . Neuropathy   . Plantar fasciitis   . Osteoarthritis bilteral knees  . Obstructive sleep apnea   . Fatty liver   . Diabetes   . Breast cancer 2015    PAST SURGICAL HISTORY: Past Surgical History  Procedure Laterality Date  . Knee surgery Left     x 2-left  . Colonoscopy  03/11/2011    diverticulosis, internal hemorrhoids  . Breast lumpectomy  1985    left-neg  . Dilation and curettage of uterus    . Cystoscopy w/ ureteral stent placement      removed  . Vaginal hysterectomy  1988    endometriosis, fibroids  . Oophorectomy Bilateral   . Breast lumpectomy  2015    left    FAMILY HISTORY Family History  Problem Relation Age of Onset  . Diabetes Mother   . Colon cancer Neg Hx   . Colon polyps Sister   . Lung cancer Sister   . Ovarian cancer Sister   . Brain cancer Sister   . Prostate cancer Father   . Brain cancer Brother   . Colon polyps Maternal Aunt   . Renal Disease Sister   . Arthritis      Neice  The patient's father died from cancer, unknown type, at the age of 51. The patient's mother died from a stroke in the setting of diabetes at the age of 28. The patient had one brother, 3 sisters. One sister had lung cancer diagnosed at age 60. Another had brain cancer the age of 46. One  brother also had brain cancer, at the age of 40. There is no history of breast or ovarian cancer in the family to the patient's knowledge  GYNECOLOGIC HISTORY:  Menarche age 89, first live birth age 69, the patient is Candice Reid P2. She is status post remote hysterectomy with bilateral salpingo-oophorectomy. She took hormone replacement approximately 20 years, stopping approximately 2010.   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 Her daughter Candice Booty "[name]"  [name] lives in [place] and works in Press photographer. The patient's son Candice Reid also lives in [place], where he is a Airline pilot. The patient has 3 grandchildren. She attends [employer]    ADVANCED DIRECTIVES: Not in place. These were discussed at the initial visit 02/20/2014. The patient tells me she has the appropriate documents in hand and intends to name her daughter Candice Reid as healthcare power of attorney. Candice Reid can be reached at [place]: Social History  Substance Use Topics  . Smoking status: Never Smoker   . Smokeless tobacco: Never Used  . Alcohol Use: No     Colonoscopy: 2013/ [facility]  PAP: Status post hysterectomy  Bone density:10/11/2013 at Dr. Noland Fordyce, normal (lowest T score of -0.2   Lipid panel:  Allergies  Allergen Reactions  . Ace Inhibitors Cough  . Metronidazole Nausea Only  . Morphine And Related Itching  . Oxycodone-Acetaminophen Nausea Only  . Septra [Sulfamethoxazole-Trimethoprim] Nausea Only  . Sulfa Antibiotics Nausea Only  . Valium [  Diazepam] Other (See Comments)    depression    Current Outpatient Prescriptions  Medication Sig Dispense Refill  . ALPRAZolam (XANAX) 0.5 MG tablet Take 0.5 mg by mouth 3 (three) times daily as needed for anxiety. 1/2 as needed    . anastrozole (ARIMIDEX) 1 MG tablet Take 1 tablet (1 mg total) by mouth daily. 90 tablet 4  .  cholecalciferol (VITAMIN D) 1000 units tablet Take 1,000 Units by mouth daily.    . DULoxetine (CYMBALTA) 60 MG capsule Take 60 mg by mouth daily.    [name] Kitchen HYDROcodone-acetaminophen (NORCO/VICODIN) 5-325 MG tablet Take 1 tablet by mouth every 6 (six) hours as needed for moderate pain.    . metoprolol (TOPROL-XL) 100 MG 24 hr tablet Take 100 mg by mouth daily.      . OMEGA-3 1000 MG CAPS Take 1 capsule by mouth daily.      [name] Kitchen omeprazole (PRILOSEC OTC) 20 MG tablet Take 20 mg by mouth daily.      . pravastatin (PRAVACHOL) 40 MG tablet Take 40 mg by mouth every Monday, Wednesday, and Friday.    . valsartan-hydrochlorothiazide (DIOVAN-HCT) 320-25 MG per tablet Take 1 tablet by mouth daily.    [name] Kitchen VITAMIN D, CHOLECALCIFEROL, PO Take 200 Units by mouth daily.      No current facility-administered medications for this visit.    OBJECTIVE: Older white woman Who appears stated age 75 Vitals:   03/24/16 1146  BP: 146/84  Pulse: 77  Temp: 97.7 F (36.5 C)  Resp: 18     Body mass index is 40.82 kg/(m^2).    ECOG FS:2 - Symptomatic, <50% confined to bed  Sclerae unicteric, EOMs intact Oropharynx clear and moist No cervical or supraclavicular adenopathy Lungs no rales or rhonchi Heart regular rate and rhythm Abd soft, nontender, positive bowel sounds MSK no focal spinal tenderness, no upper extremity lymphedema Neuro: nonfocal, well oriented, appropriate affect Breasts: The right breast is unremarkable. The left breast is status post lumpectomy followed by radiation. There is no evidence of local recurrence. The left axilla is benign.  LAB RESULTS:  CMP     Component Value Date/Time   NA 141 03/24/2016 1109   NA 141 02/01/2016 1114   K 4.0 03/24/2016 1109   K 4.0 02/01/2016 1114   CL 103 02/01/2016 1114   CO2 27 03/24/2016 1109   CO2 25 02/01/2016 1107   GLUCOSE 76 03/24/2016 1109   GLUCOSE 95 02/01/2016 1114   BUN 11.8 03/24/2016 1109   BUN 16 02/01/2016 1114   CREATININE 0.7  03/24/2016 1109   CREATININE 0.60 02/01/2016 1114   CALCIUM 9.7 03/24/2016 1109   CALCIUM 9.7 02/01/2016 1107   PROT 7.2 03/24/2016 1109   PROT 6.7 02/01/2016 1107   ALBUMIN 3.4* 03/24/2016 1109   ALBUMIN 3.3* 02/01/2016 1107   AST 20 03/24/2016 1109   AST 30 02/01/2016 1107   ALT 21 03/24/2016 1109   ALT 32 02/01/2016 1107   ALKPHOS 90 03/24/2016 1109   ALKPHOS 78 02/01/2016 1107   BILITOT 0.33 03/24/2016 1109   BILITOT 0.7 02/01/2016 1107   GFRNONAA >60 02/01/2016 1107   GFRAA >60 02/01/2016 1107    I No results found for: SPEP  Lab Results  Component Value Date   WBC 9.7 03/24/2016   NEUTROABS 5.6 03/24/2016   HGB 12.7 03/24/2016   HCT 39.0 03/24/2016   MCV 90.4 03/24/2016   PLT 277 03/24/2016      Chemistry  Component Value Date/Time   NA 141 03/24/2016 1109   NA 141 02/01/2016 1114   K 4.0 03/24/2016 1109   K 4.0 02/01/2016 1114   CL 103 02/01/2016 1114   CO2 27 03/24/2016 1109   CO2 25 02/01/2016 1107   BUN 11.8 03/24/2016 1109   BUN 16 02/01/2016 1114   CREATININE 0.7 03/24/2016 1109   CREATININE 0.60 02/01/2016 1114      Component Value Date/Time   CALCIUM 9.7 03/24/2016 1109   CALCIUM 9.7 02/01/2016 1107   ALKPHOS 90 03/24/2016 1109   ALKPHOS 78 02/01/2016 1107   AST 20 03/24/2016 1109   AST 30 02/01/2016 1107   ALT 21 03/24/2016 1109   ALT 32 02/01/2016 1107   BILITOT 0.33 03/24/2016 1109   BILITOT 0.7 02/01/2016 1107       No results found for: LABCA2  No components found for: LABCA125  No results for input(s): INR in the last 168 hours.  Urinalysis No results found for: COLORURINE   STUDIES: CLINICAL DATA: Lumpectomy of the left breast in June 2015 for left breast cancer. Annual exam.  EXAM: 2D DIGITAL DIAGNOSTIC BILATERAL MAMMOGRAM WITH CAD AND ADJUNCT TOMO  COMPARISON: Previous exam(s).  ACR Breast Density Category b: There are scattered areas of fibroglandular density.  FINDINGS: There are stable lumpectomy  changes in the medial left breast. There is a stable mildly nodular parenchymal pattern bilaterally. No mass, nonsurgical distortion, or suspicious microcalcification is identified in either breast to suggest malignancy.  Mammographic images were processed with CAD.  IMPRESSION: No evidence of malignancy in either breast. Lumpectomy changes on the left.  RECOMMENDATION: Diagnostic mammogram is suggested in 1 year. (Code:[id number]-01Y)  I have discussed the findings and recommendations with the patient. Results were also provided in writing at the conclusion of the visit. If applicable, a reminder letter will be sent to the patient regarding the next appointment.  BI-RADS CATEGORY 2: Benign.   Electronically Signed  By: Curlene Dolphin M.D.  On: 02/19/2016 16:59        ASSESSMENT: 75 y.o.  [place] woman status post left breast lower inner quadrant biopsy 02/14/2014 for a clinical T1b N0, stage IA invasive ductal carcinoma, grade 2, estrogen and progesterone receptor positive, with a low MIB-1-at 10%, and HER-2 results negative  (1) status post left lumpectomy and sentinel lymph node sampling 03/11/2014 for a pT1c pN0, Stage IA invasive ductal carcinoma, grade 2, repeat HER-2 again negative, with negative margins  (2) Adjuvant radiation completed 05/14/2014 [44 Gy at 2 Gy per fraction x 22 fractions to the left breast]  (3) started anastrozole 06/27/2014  (a) bone density 10/01/2013 was normal   PLAN: Amos is Now 2 years out from definitive surgery for her breast cancer with no evidence of disease recurrence. This is very favorable.  She is tolerating anastrozole generally well. The problems she is having with her knees really are not related to this. The plan is to continue anastrozole for total of 5 years.  I strongly urged her to get her knee problems taking care of 1 where the other, since she is already very limited in what she can do, and she can only  continue to gain weight and lose functionality unless she takes care of this problem.  At this point I'm comfortable seeing her on a once a year basis, in June, after her May mammography.  She knows to call for any problems that may develop before her next visit here.  Prudy Feeler, MD   03/24/2016 6:59 PM

## 2016-03-24 ENCOUNTER — Telehealth: Payer: Self-pay | Admitting: Oncology

## 2016-03-24 ENCOUNTER — Ambulatory Visit (HOSPITAL_BASED_OUTPATIENT_CLINIC_OR_DEPARTMENT_OTHER): Payer: Medicare Other | Admitting: Oncology

## 2016-03-24 ENCOUNTER — Other Ambulatory Visit (HOSPITAL_BASED_OUTPATIENT_CLINIC_OR_DEPARTMENT_OTHER): Payer: Medicare Other

## 2016-03-24 VITALS — BP 146/84 | HR 77 | Temp 97.7°F | Resp 18 | Ht 64.0 in | Wt 237.9 lb

## 2016-03-24 DIAGNOSIS — M545 Low back pain: Secondary | ICD-10-CM | POA: Diagnosis not present

## 2016-03-24 DIAGNOSIS — C50312 Malignant neoplasm of lower-inner quadrant of left female breast: Secondary | ICD-10-CM

## 2016-03-24 LAB — CBC WITH DIFFERENTIAL/PLATELET
BASO%: 0.5 % (ref 0.0–2.0)
Basophils Absolute: 0.1 10*3/uL (ref 0.0–0.1)
EOS%: 3.5 % (ref 0.0–7.0)
Eosinophils Absolute: 0.3 10*3/uL (ref 0.0–0.5)
HCT: 39 % (ref 34.8–46.6)
HEMOGLOBIN: 12.7 g/dL (ref 11.6–15.9)
LYMPH%: 30.2 % (ref 14.0–49.7)
MCH: 29.4 pg (ref 25.1–34.0)
MCHC: 32.5 g/dL (ref 31.5–36.0)
MCV: 90.4 fL (ref 79.5–101.0)
MONO#: 0.8 10*3/uL (ref 0.1–0.9)
MONO%: 8.5 % (ref 0.0–14.0)
NEUT%: 57.3 % (ref 38.4–76.8)
NEUTROS ABS: 5.6 10*3/uL (ref 1.5–6.5)
Platelets: 277 10*3/uL (ref 145–400)
RBC: 4.32 10*6/uL (ref 3.70–5.45)
RDW: 13.7 % (ref 11.2–14.5)
WBC: 9.7 10*3/uL (ref 3.9–10.3)
lymph#: 2.9 10*3/uL (ref 0.9–3.3)

## 2016-03-24 LAB — COMPREHENSIVE METABOLIC PANEL
ALT: 21 U/L (ref 0–55)
ANION GAP: 9 meq/L (ref 3–11)
AST: 20 U/L (ref 5–34)
Albumin: 3.4 g/dL — ABNORMAL LOW (ref 3.5–5.0)
Alkaline Phosphatase: 90 U/L (ref 40–150)
BUN: 11.8 mg/dL (ref 7.0–26.0)
CALCIUM: 9.7 mg/dL (ref 8.4–10.4)
CHLORIDE: 105 meq/L (ref 98–109)
CO2: 27 mEq/L (ref 22–29)
CREATININE: 0.7 mg/dL (ref 0.6–1.1)
EGFR: 85 mL/min/{1.73_m2} — ABNORMAL LOW (ref >90)
Glucose: 76 mg/dl (ref 70–140)
Potassium: 4 mEq/L (ref 3.5–5.1)
SODIUM: 141 meq/L (ref 136–145)
Total Bilirubin: 0.33 mg/dL (ref 0.20–1.20)
Total Protein: 7.2 g/dL (ref 6.4–8.3)

## 2016-03-24 MED ORDER — ANASTROZOLE 1 MG PO TABS: 1.0000 mg | ORAL_TABLET | Freq: Every day | ORAL | Status: DC

## 2016-03-24 NOTE — Telephone Encounter (Signed)
appt made and avs printed °

## 2017-01-05 ENCOUNTER — Other Ambulatory Visit: Payer: Self-pay | Admitting: Oncology

## 2017-01-05 DIAGNOSIS — Z853 Personal history of malignant neoplasm of breast: Secondary | ICD-10-CM

## 2017-01-05 DIAGNOSIS — Z9889 Other specified postprocedural states: Secondary | ICD-10-CM

## 2017-01-05 DIAGNOSIS — N6320 Unspecified lump in the left breast, unspecified quadrant: Secondary | ICD-10-CM

## 2017-03-07 ENCOUNTER — Ambulatory Visit
Admission: RE | Admit: 2017-03-07 | Discharge: 2017-03-07 | Disposition: A | Discharge status: Home / Self Care | Payer: Medicare Other | Source: Ambulatory Visit | Attending: Oncology | Admitting: Oncology

## 2017-03-07 DIAGNOSIS — Z853 Personal history of malignant neoplasm of breast: Secondary | ICD-10-CM

## 2017-03-07 DIAGNOSIS — Z9889 Other specified postprocedural states: Secondary | ICD-10-CM

## 2017-03-07 HISTORY — DX: Personal history of irradiation: Z92.3

## 2017-03-11 ENCOUNTER — Telehealth: Payer: Self-pay | Admitting: Oncology

## 2017-03-11 NOTE — Telephone Encounter (Signed)
Confirmed appointment change and lab appointment

## 2017-03-15 ENCOUNTER — Other Ambulatory Visit: Payer: Self-pay

## 2017-03-15 DIAGNOSIS — C50312 Malignant neoplasm of lower-inner quadrant of left female breast: Secondary | ICD-10-CM

## 2017-03-16 ENCOUNTER — Other Ambulatory Visit (HOSPITAL_BASED_OUTPATIENT_CLINIC_OR_DEPARTMENT_OTHER): Payer: Medicare Other

## 2017-03-16 DIAGNOSIS — C50312 Malignant neoplasm of lower-inner quadrant of left female breast: Secondary | ICD-10-CM | POA: Diagnosis not present

## 2017-03-16 LAB — COMPREHENSIVE METABOLIC PANEL
ALK PHOS: 82 U/L (ref 40–150)
ALT: 38 U/L (ref 0–55)
AST: 40 U/L — AB (ref 5–34)
Albumin: 3.5 g/dL (ref 3.5–5.0)
Anion Gap: 13 mEq/L — ABNORMAL HIGH (ref 3–11)
BUN: 13.5 mg/dL (ref 7.0–26.0)
CHLORIDE: 103 meq/L (ref 98–109)
CO2: 26 meq/L (ref 22–29)
Calcium: 9.9 mg/dL (ref 8.4–10.4)
Creatinine: 0.8 mg/dL (ref 0.6–1.1)
EGFR: 77 mL/min/{1.73_m2} — AB (ref >90)
GLUCOSE: 118 mg/dL (ref 70–140)
POTASSIUM: 4 meq/L (ref 3.5–5.1)
SODIUM: 143 meq/L (ref 136–145)
Total Bilirubin: 0.43 mg/dL (ref 0.20–1.20)
Total Protein: 7.3 g/dL (ref 6.4–8.3)

## 2017-03-16 LAB — CBC WITH DIFFERENTIAL/PLATELET
BASO%: 0.6 % (ref 0.0–2.0)
BASOS ABS: 0.1 10*3/uL (ref 0.0–0.1)
EOS ABS: 0.3 10*3/uL (ref 0.0–0.5)
EOS%: 2.7 % (ref 0.0–7.0)
HCT: 40.4 % (ref 34.8–46.6)
HGB: 13.2 g/dL (ref 11.6–15.9)
LYMPH%: 32.4 % (ref 14.0–49.7)
MCH: 29.5 pg (ref 25.1–34.0)
MCHC: 32.8 g/dL (ref 31.5–36.0)
MCV: 90.1 fL (ref 79.5–101.0)
MONO#: 0.9 10*3/uL (ref 0.1–0.9)
MONO%: 9.3 % (ref 0.0–14.0)
NEUT#: 5.2 10*3/uL (ref 1.5–6.5)
NEUT%: 55 % (ref 38.4–76.8)
Platelets: 294 10*3/uL (ref 145–400)
RBC: 4.48 10*6/uL (ref 3.70–5.45)
RDW: 13.6 % (ref 11.2–14.5)
WBC: 9.5 10*3/uL (ref 3.9–10.3)
lymph#: 3.1 10*3/uL (ref 0.9–3.3)

## 2017-03-22 ENCOUNTER — Ambulatory Visit (HOSPITAL_BASED_OUTPATIENT_CLINIC_OR_DEPARTMENT_OTHER): Payer: Medicare Other | Admitting: Oncology

## 2017-03-22 VITALS — BP 138/81 | HR 90 | Temp 97.6°F | Resp 18 | Ht 64.0 in | Wt 237.4 lb

## 2017-03-22 DIAGNOSIS — N951 Menopausal and female climacteric states: Secondary | ICD-10-CM

## 2017-03-22 DIAGNOSIS — C50312 Malignant neoplasm of lower-inner quadrant of left female breast: Secondary | ICD-10-CM

## 2017-03-22 DIAGNOSIS — Z170 Estrogen receptor positive status [ER+]: Secondary | ICD-10-CM

## 2017-03-22 DIAGNOSIS — E119 Type 2 diabetes mellitus without complications: Secondary | ICD-10-CM | POA: Diagnosis not present

## 2017-03-22 DIAGNOSIS — L299 Pruritus, unspecified: Secondary | ICD-10-CM | POA: Diagnosis not present

## 2017-03-22 MED ORDER — KETOCONAZOLE 2 % EX CREA: 1.0000 "application " | TOPICAL_CREAM | Freq: Every day | CUTANEOUS | 6 refills | Status: DC

## 2017-03-22 MED ORDER — CLONIDINE HCL 0.1 MG/24HR TD PTWK: 0.1000 mg | MEDICATED_PATCH | TRANSDERMAL | 12 refills | Status: DC

## 2017-03-22 NOTE — Progress Notes (Signed)
[place]  Telephone:(336) [phone] Fax:(336) [phone]     ID: [name] OB: 25-Apr-1941  MR#: [id number]  VOZ#:[id number]  PCP: Maury Dus, MD GYN:  Evette Cristal SU:  Alphonsa Overall OTHER MD: Thea Silversmith  CHIEF COMPLAINT: Estrogen receptor positive early-stage breast cancer  CURRENT TREATMENT: Anastrozole  BREAST CANCER HISTORY: From the original intake note:   That he had routine screening mammography may 2015 showing a possible mass in the left breast. On 02/08/2014 she had left diagnostic mammography and ultrasonography of the breast Center, showing breast density category B. Otherwise a 1.1 cm ovoid mass in the inner lower quadrant of the left breast. Ultrasound confirmed an irregularly shaped mass measuring 1.0 cm. Ultrasound of the left axilla was unremarkable.  Biopsy of the left breast mass in question 02/14/2014 showed ([id number]) and invasive ductal carcinoma, grade 2, estrogen and progesterone receptor positive, with an MIB-1 of 10%. HER-2 is pending.  The patient's subsequent history is as detailed below  INTERVAL HISTORY: Johnelle returns today for follow-up and treatment of her estrogen receptor positive breast cancer. She continues on anastrozole generally with good tolerance. She does say that the hot flashes are "bad". They're worse during the day. They rarely wake her up at night. She obtains it at a good price.  REVIEW OF SYSTEMS: Cherysh has recently been diagnosed with diabetes. She wonders if this is what makes her feel itchy all over. She is a little bit more itchy in the irradiated breast. But really this is a systemic problem. She has a rash under the breasts which comes and goes she says. Aside from these issues a detailed review of systems today was stable   PAST MEDICAL HISTORY: Past Medical History:  Diagnosis Date  . Anxiety                                                                                                                                                                                                                                                                   . Arthritis   . Asthma   . Breast cancer ([place]) 2015  . Complication of anesthesia    shaky-hard to wake up  . Dental bridge present   . Depression   . Diabetes (  [place])   . Fatty liver   . GERD (gastroesophageal reflux disease)   . Hemorrhoids   . Hot flashes   . HTN (hypertension)   . Hyperlipemia   . Hypertension   . Kidney stones   . Neuropathy   . Obesity   . Obstructive sleep apnea   . Osteoarthritis bilteral knees  . Personal history of radiation therapy 2015  . Phobia   . Plantar fasciitis   . Radiation 05/14/14   44 Gy Left breast  . Wears glasses    reading    PAST SURGICAL HISTORY: Past Surgical History:  Procedure Laterality Date  . BREAST LUMPECTOMY  1985   left-neg  . BREAST LUMPECTOMY Left 03/11/2014   left  . COLONOSCOPY  03/11/2011   diverticulosis, internal hemorrhoids  . CYSTOSCOPY W/ URETERAL STENT PLACEMENT     removed  . DILATION AND CURETTAGE OF UTERUS    . KNEE SURGERY Left    x 2-left  . OOPHORECTOMY Bilateral   . VAGINAL HYSTERECTOMY  1988   endometriosis, fibroids    FAMILY HISTORY Family History  Problem Relation Age of Onset  . Diabetes Mother   . Colon polyps Sister   . Lung cancer Sister   . Ovarian cancer Sister   . Brain cancer Sister   . Prostate cancer Father   . Brain cancer Brother   . Colon polyps Maternal Aunt   . Renal Disease Sister   . Arthritis Unknown        Neice  . Colon cancer Neg Hx   The patient's father died from cancer, unknown type, at the age of 66. The patient's mother died from a stroke in the setting of diabetes at the age of 9. The patient had one brother, 3 sisters. One sister had lung cancer diagnosed at age 35. Another had brain cancer the age of 16. One brother also had brain cancer, at the age of 59. There is no history of breast or ovarian cancer  in the family to the patient's knowledge  GYNECOLOGIC HISTORY:  Menarche age 6, first live birth age 39, the patient is [place] P2. She is status post remote hysterectomy with bilateral salpingo-oophorectomy. She took hormone replacement approximately 20 years, stopping approximately 2010.   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 Her daughter [name] lives in [place] and works in Press photographer. The patient's son [name] "[name] " [place] also lives in [place], where he is a Airline pilot. The patient has 3 grandchildren. She attends [employer]    ADVANCED DIRECTIVES: Not in place. These were discussed at the initial visit 02/20/2014. The patient tells me she has the appropriate documents in hand and intends to name her daughter [name] as healthcare power of attorney. [name] can be reached at [place]: Social History  Substance Use Topics  . Smoking status: Never Smoker  . Smokeless tobacco: Never Used  . Alcohol use No     Colonoscopy: 2013/ [facility]  PAP: Status post hysterectomy  Bone density:10/11/2013 at Dr. Noland Fordyce, normal (lowest T score of -0.2   Lipid panel:  Allergies  Allergen Reactions  . Ace Inhibitors Cough  . Metronidazole Nausea Only  . Morphine And Related Itching  . Oxycodone-Acetaminophen Nausea Only  . Septra [Sulfamethoxazole-Trimethoprim] Nausea Only  . Sulfa Antibiotics Nausea Only  . Valium [Diazepam] Other (See Comments)  depression    Current Outpatient Prescriptions  Medication Sig Dispense Refill  . ALPRAZolam (XANAX) 0.5 MG tablet Take 0.5 mg by mouth 3 (three) times daily as needed for anxiety. 1/2 as needed    . anastrozole (ARIMIDEX) 1 MG tablet Take 1 tablet (1 mg total) by mouth daily. 90 tablet 4  . cholecalciferol (VITAMIN D) 1000 units tablet Take 1,000 Units by mouth daily.    . DULoxetine (CYMBALTA) 60 MG  capsule Take 60 mg by mouth daily.    [name] Kitchen HYDROcodone-acetaminophen (NORCO/VICODIN) 5-325 MG tablet Take 1 tablet by mouth every 6 (six) hours as needed for moderate pain.    . metoprolol (TOPROL-XL) 100 MG 24 hr tablet Take 100 mg by mouth daily.      . OMEGA-3 1000 MG CAPS Take 1 capsule by mouth daily.      [name] Kitchen omeprazole (PRILOSEC OTC) 20 MG tablet Take 20 mg by mouth daily.      . pravastatin (PRAVACHOL) 40 MG tablet Take 40 mg by mouth every Monday, Wednesday, and Friday.    . valsartan-hydrochlorothiazide (DIOVAN-HCT) 320-25 MG per tablet Take 1 tablet by mouth daily.    [name] Kitchen VITAMIN D, CHOLECALCIFEROL, PO Take 200 Units by mouth daily.      No current facility-administered medications for this visit.     OBJECTIVE: Older white woman In no acute distress   Vitals:   03/22/17 1427  BP: 138/81  Pulse: 90  Resp: 18  Temp: 97.6 F (36.4 C)     Body mass index is 40.75 kg/m.    ECOG FS:1 - Symptomatic but completely ambulatory  Sclerae unicteric, pupils round and equal Oropharynx clear and moist No cervical or supraclavicular adenopathy Lungs no rales or rhonchi Heart regular rate and rhythm Abd soft, nontender, positive bowel sounds MSK no focal spinal tenderness, no upper extremity lymphedema Neuro: nonfocal, well oriented, appropriate affect Breasts: The right breast is benign. The left breast is undergone lumpectomy and radiation with no evidence of local recurrence. Both axillae are benign.  LAB RESULTS:  CMP     Component Value Date/Time   NA 143 03/16/2017 1258   K 4.0 03/16/2017 1258   CL 103 02/01/2016 1114   CO2 26 03/16/2017 1258   GLUCOSE 118 03/16/2017 1258   BUN 13.5 03/16/2017 1258   CREATININE 0.8 03/16/2017 1258   CALCIUM 9.9 03/16/2017 1258   PROT 7.3 03/16/2017 1258   ALBUMIN 3.5 03/16/2017 1258   AST 40 (H) 03/16/2017 1258   ALT 38 03/16/2017 1258   ALKPHOS 82 03/16/2017 1258   BILITOT 0.43 03/16/2017 1258   GFRNONAA >60 02/01/2016 1107   GFRAA  >60 02/01/2016 1107    I No results found for: SPEP  Lab Results  Component Value Date   WBC 9.5 03/16/2017   NEUTROABS 5.2 03/16/2017   HGB 13.2 03/16/2017   HCT 40.4 03/16/2017   MCV 90.1 03/16/2017   PLT 294 03/16/2017      Chemistry      Component Value Date/Time   NA 143 03/16/2017 1258   K 4.0 03/16/2017 1258   CL 103 02/01/2016 1114   CO2 26 03/16/2017 1258   BUN 13.5 03/16/2017 1258   CREATININE 0.8 03/16/2017 1258      Component Value Date/Time   CALCIUM 9.9 03/16/2017 1258   ALKPHOS 82 03/16/2017 1258   AST 40 (H) 03/16/2017 1258   ALT 38 03/16/2017 1258   BILITOT 0.43 03/16/2017 1258       No  results found for: LABCA2  No components found for: LABCA125  No results for input(s): INR in the last 168 hours.  Urinalysis No results found for: COLORURINE   STUDIES: Bilateral diagnostic mammography with tomography at the [facility] 03/07/2017 found a breast density to be category B. There was no evidence of recurrent or new malignancy. There were benign postsurgical changes noted in the left breast  ASSESSMENT: 76 y.o.  [place] woman status post left breast lower inner quadrant biopsy 02/14/2014 for a clinical T1b N0, stage IA invasive ductal carcinoma, grade 2, estrogen and progesterone receptor positive, with a low MIB-1-at 10%, and HER-2 results negative  (1) status post left lumpectomy and sentinel lymph node sampling 03/11/2014 for a pT1c pN0, Stage IA invasive ductal carcinoma, grade 2, repeat HER-2 again negative, with negative margins  (2) Adjuvant radiation completed 05/14/2014 [44 Gy at 2 Gy per fraction x 22 fractions to the left breast]  (3) started anastrozole 06/27/2014  (a) bone density 10/01/2013 was normal   PLAN: Rona Is now 3 years out from definitive surgery for her breast cancer with no evidence of disease recurrence. This is very favorable.  She is tolerating anastrozole generally well. Hot flashes other main problem. Since  she is already on Cymbalta, I don't think we can add venlafaxine or if we did I don't think it would be helpful. Gabapentin is primarily for nighttime use in this setting since otherwise the patient become excessively somnolent during the day.  Accordingly we are going to try the TTS 1 patch. We discussed the possible toxicities, side effects and complications of this agent. I went ahead and wrote her the prescription. She will let me know if she has any problems from it or also if it does not work.  She has already changed her soap as far as her itching is concerned. She is using several lotions. I suggested she stop using all skin lotions and safety itching improves.  Otherwise she will see me again in one year. In 2 years she will have her final visit and we'll be able to "graduate".  She knows to call for any other problems that may develop before then.      Prudy Feeler, MD   03/22/2017 2:41 PM

## 2017-03-23 ENCOUNTER — Ambulatory Visit: Payer: Medicare Other | Admitting: Oncology

## 2017-04-28 ENCOUNTER — Other Ambulatory Visit: Payer: Self-pay | Admitting: Emergency Medicine

## 2017-04-28 DIAGNOSIS — C50312 Malignant neoplasm of lower-inner quadrant of left female breast: Secondary | ICD-10-CM

## 2017-04-28 MED ORDER — ANASTROZOLE 1 MG PO TABS: 1.0000 mg | ORAL_TABLET | Freq: Every day | ORAL | 4 refills | Status: DC

## 2018-01-24 ENCOUNTER — Other Ambulatory Visit: Payer: Self-pay | Admitting: Oncology

## 2018-01-24 DIAGNOSIS — Z853 Personal history of malignant neoplasm of breast: Secondary | ICD-10-CM

## 2018-03-14 ENCOUNTER — Ambulatory Visit
Admission: RE | Admit: 2018-03-14 | Discharge: 2018-03-14 | Disposition: A | Discharge status: Home / Self Care | Payer: Medicare Other | Source: Ambulatory Visit | Attending: Oncology | Admitting: Oncology

## 2018-03-14 DIAGNOSIS — Z853 Personal history of malignant neoplasm of breast: Secondary | ICD-10-CM

## 2018-04-06 ENCOUNTER — Other Ambulatory Visit: Payer: Self-pay | Admitting: Oncology

## 2018-04-10 ENCOUNTER — Other Ambulatory Visit: Payer: Self-pay | Admitting: *Deleted

## 2018-04-10 DIAGNOSIS — Z9283 Personal history of failed moderate sedation: Secondary | ICD-10-CM

## 2018-04-10 DIAGNOSIS — Z170 Estrogen receptor positive status [ER+]: Secondary | ICD-10-CM

## 2018-04-10 DIAGNOSIS — C50312 Malignant neoplasm of lower-inner quadrant of left female breast: Secondary | ICD-10-CM

## 2018-04-10 NOTE — Progress Notes (Signed)
[place]  Telephone:(336) [phone] Fax:(336) [phone]     ID: [name] OB: 1941-02-08  MR#: [id number]  FTD#:[id number]  PCP: Maury Dus, MD GYN:  Evette Cristal SU:  Alphonsa Overall OTHER MD: Thea Silversmith  CHIEF COMPLAINT: Estrogen receptor positive early-stage breast cancer  CURRENT TREATMENT: Anastrozole  BREAST CANCER HISTORY: From the original intake note:   That he had routine screening mammography may 2015 showing a possible mass in the left breast. On 02/08/2014 she had left diagnostic mammography and ultrasonography of the breast Center, showing breast density category B. Otherwise a 1.1 cm ovoid mass in the inner lower quadrant of the left breast. Ultrasound confirmed an irregularly shaped mass measuring 1.0 cm. Ultrasound of the left axilla was unremarkable.  Biopsy of the left breast mass in question 02/14/2014 showed ([id number]) and invasive ductal carcinoma, grade 2, estrogen and progesterone receptor positive, with an MIB-1 of 10%. HER-2 is pending.  The patient's subsequent history is as detailed below  INTERVAL HISTORY: Eraina returns today for follow-up and treatment of her estrogen receptor positive breast cancer. She continues on anastrozole, with good tolerance. She uses the clonidine patch for hot flashes. The hot flashes are better while on the patch. She pays about $8 per month for the anastrozole but currently about $65 a month for the patches..   Since her last visit, she underwent diagnostic bilateral mammography with CAD and tomography on 03/14/2018 at [facility] showing: breast density category B. There was no evidence of malignancy.   REVIEW OF SYSTEMS: Amenah reports that she is doing well. She walks with a rollator. She goes to lunch with friends 3-4 times per week. She has issues with bone-bone contact in both knees, and she is planning to have knee surgery under Dr. Alvan Dame. Because of her knee issues, she is not  exercising. She denies unusual headaches, visual changes, nausea, vomiting, or dizziness. There has been no unusual cough, phlegm production, or pleurisy. This been no change in bowel or bladder habits. She denies unexplained fatigue or unexplained weight loss, bleeding, rash, or fever. A detailed review of systems was otherwise stable.    PAST MEDICAL HISTORY: Past Medical History:  Diagnosis Date  . Anxiety                                                                                                                                                                                                                                                                  .  Arthritis   . Asthma   . Breast cancer ([place]) 2015  . Complication of anesthesia    shaky-hard to wake up  . Dental bridge present   . Depression   . Diabetes ([place])   . Fatty liver   . GERD (gastroesophageal reflux disease)   . Hemorrhoids   . Hot flashes   . HTN (hypertension)   . Hyperlipemia   . Hypertension   . Kidney stones   . Neuropathy   . Obesity   . Obstructive sleep apnea   . Osteoarthritis bilteral knees  . Personal history of radiation therapy 2015  . Phobia   . Plantar fasciitis   . Radiation 05/14/14   44 Gy Left breast  . Wears glasses    reading    PAST SURGICAL HISTORY: Past Surgical History:  Procedure Laterality Date  . BREAST LUMPECTOMY  1985   left-neg  . BREAST LUMPECTOMY Left 03/11/2014   left  . COLONOSCOPY  03/11/2011   diverticulosis, internal hemorrhoids  . CYSTOSCOPY W/ URETERAL STENT PLACEMENT     removed  . DILATION AND CURETTAGE OF UTERUS    . KNEE SURGERY Left    x 2-left  . OOPHORECTOMY Bilateral   . VAGINAL HYSTERECTOMY  1988   endometriosis, fibroids    FAMILY HISTORY Family History  Problem Relation Age of Onset  . Diabetes Mother   . Colon polyps Sister   . Lung cancer Sister   . Ovarian cancer Sister   . Brain cancer Sister   . Prostate cancer Father   .  Brain cancer Brother   . Colon polyps Maternal Aunt   . Renal Disease Sister   . Arthritis Unknown        Neice  . Colon cancer Neg Hx   The patient's father died from cancer, unknown type, at the age of 70. The patient's mother died from a stroke in the setting of diabetes at the age of 37. The patient had one brother, 3 sisters. One sister had lung cancer diagnosed at age 44. Another had brain cancer the age of 59. One brother also had brain cancer, at the age of 12. There is no history of breast or ovarian cancer in the family to the patient's knowledge  GYNECOLOGIC HISTORY:  Menarche age 34, first live birth age 37, the patient is [place] P2. She is status post remote hysterectomy with bilateral salpingo-oophorectomy. She took hormone replacement approximately 20 years, stopping approximately 2010.   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 Her daughter [name] lives in [place] and works in Press photographer. The patient's son [name] "[name] " [place] also lives in [place], where he is a Airline pilot. The patient has 3 grandchildren. She attends [employer]    ADVANCED DIRECTIVES: Not in place. These were discussed at the initial visit 02/20/2014. The patient tells me she has the appropriate documents in hand and intends to name her daughter [name] as healthcare power of attorney. [name] can be reached at [place]: Social History   Tobacco Use  . Smoking status: Never Smoker  . Smokeless tobacco: Never Used  Substance Use Topics  . Alcohol use: No  . Drug use: No     Colonoscopy: 2013/ [facility]  PAP: Status post hysterectomy  Bone density:10/11/2013 at Dr. Noland Fordyce, normal (lowest T score of -0.2   Lipid panel:  Allergies  Allergen Reactions  . Ace Inhibitors Cough  . Metronidazole Nausea Only  . Morphine And Related Itching  .  Oxycodone-Acetaminophen Nausea Only  . Septra [Sulfamethoxazole-Trimethoprim] Nausea Only  . Sulfa Antibiotics Nausea Only  . Valium [Diazepam] Other (See Comments)    depression    Current Outpatient Medications  Medication Sig Dispense Refill  . ALPRAZolam (XANAX) 0.5 MG tablet Take 0.5 mg by mouth 3 (three) times daily as needed for anxiety. 1/2 as needed    . anastrozole (ARIMIDEX) 1 MG tablet Take 1 tablet (1 mg total) by mouth daily. 90 tablet 4  . cholecalciferol (VITAMIN D) 1000 units tablet Take 1,000 Units by mouth daily.    . cloNIDine (CATAPRES - DOSED IN MG/24 HR) 0.1 mg/24hr patch UNWRAP AND PLACE 1 PATCH ONTO THE SKIN ONCE A WEEK 4 patch 2  . DULoxetine (CYMBALTA) 60 MG capsule Take 60 mg by mouth daily.    [name] Kitchen ketoconazole (NIZORAL) 2 % cream Apply 1 application topically daily. 15 g 6  . metoprolol (TOPROL-XL) 100 MG 24 hr tablet Take 100 mg by mouth daily.      . OMEGA-3 1000 MG CAPS Take 1 capsule by mouth daily.      [name] Kitchen omeprazole (PRILOSEC OTC) 20 MG tablet Take 20 mg by mouth daily.      . pravastatin (PRAVACHOL) 40 MG tablet Take 40 mg by mouth every Monday, Wednesday, and Friday.    . valsartan-hydrochlorothiazide (DIOVAN-HCT) 320-25 MG per tablet Take 1 tablet by mouth daily.    [name] Kitchen VITAMIN D, CHOLECALCIFEROL, PO Take 200 Units by mouth daily.      No current facility-administered medications for this visit.     OBJECTIVE: Older white woman who appears stated age  78:   04/11/18 1325  BP: 138/60  Pulse: 72  Resp: 18  Temp: 98.1 F (36.7 C)  SpO2: 96%     Body mass index is 40.44 kg/m.    ECOG FS:1 - Symptomatic but completely ambulatory  Sclerae unicteric, EOMs intact No cervical or supraclavicular adenopathy Lungs no rales or rhonchi Heart regular rate and rhythm Abd soft, nontender, positive bowel sounds MSK no focal spinal tenderness, no upper extremity lymphedema Neuro: nonfocal, well oriented, appropriate affect Breasts: The right breast is  benign.  The left breast is status post lumpectomy and radiation.  There is no evidence of local recurrence.  Both axillae are benign.   LAB RESULTS:  CMP     Component Value Date/Time   NA 140 04/11/2018 1258   NA 143 03/16/2017 1258   K 4.0 04/11/2018 1258   K 4.0 03/16/2017 1258   CL 105 04/11/2018 1258   CO2 27 04/11/2018 1258   CO2 26 03/16/2017 1258   GLUCOSE 79 04/11/2018 1258   GLUCOSE 118 03/16/2017 1258   BUN 16 04/11/2018 1258   BUN 13.5 03/16/2017 1258   CREATININE 0.76 04/11/2018 1258   CREATININE 0.8 03/16/2017 1258   CALCIUM 9.6 04/11/2018 1258   CALCIUM 9.9 03/16/2017 1258   PROT 7.3 04/11/2018 1258   PROT 7.3 03/16/2017 1258   ALBUMIN 3.6 04/11/2018 1258   ALBUMIN 3.5 03/16/2017 1258   AST 26 04/11/2018 1258   AST 40 (H) 03/16/2017 1258   ALT 28 04/11/2018 1258   ALT 38 03/16/2017 1258   ALKPHOS 90 04/11/2018 1258   ALKPHOS 82 03/16/2017 1258   BILITOT 0.3 04/11/2018 1258   BILITOT 0.43 03/16/2017 1258   GFRNONAA >60 04/11/2018 1258   GFRAA >  60 04/11/2018 1258    I No results found for: SPEP  Lab Results  Component Value Date   WBC 10.6 (H) 04/11/2018   NEUTROABS 6.0 04/11/2018   HGB 12.8 04/11/2018   HCT 38.5 04/11/2018   MCV 88.4 04/11/2018   PLT 258 04/11/2018      Chemistry      Component Value Date/Time   NA 140 04/11/2018 1258   NA 143 03/16/2017 1258   K 4.0 04/11/2018 1258   K 4.0 03/16/2017 1258   CL 105 04/11/2018 1258   CO2 27 04/11/2018 1258   CO2 26 03/16/2017 1258   BUN 16 04/11/2018 1258   BUN 13.5 03/16/2017 1258   CREATININE 0.76 04/11/2018 1258   CREATININE 0.8 03/16/2017 1258      Component Value Date/Time   CALCIUM 9.6 04/11/2018 1258   CALCIUM 9.9 03/16/2017 1258   ALKPHOS 90 04/11/2018 1258   ALKPHOS 82 03/16/2017 1258   AST 26 04/11/2018 1258   AST 40 (H) 03/16/2017 1258   ALT 28 04/11/2018 1258   ALT 38 03/16/2017 1258   BILITOT 0.3 04/11/2018 1258   BILITOT 0.43 03/16/2017 1258       No results  found for: LABCA2  No components found for: LABCA125  No results for input(s): INR in the last 168 hours.  Urinalysis No results found for: COLORURINE   STUDIES: Mm Diag Breast Tomo Bilateral  Result Date: 03/14/2018 CLINICAL DATA:  History of treated left breast cancer, status post lumpectomy in 2015 EXAM: DIGITAL DIAGNOSTIC BILATERAL MAMMOGRAM WITH CAD AND TOMO COMPARISON:  Previous exam(s). ACR Breast Density Category b: There are scattered areas of fibroglandular density. FINDINGS: Mammographically, there are no suspicious masses, areas of nonsurgical architectural distortion or microcalcifications in either breast. Stable postsurgical changes in the left breast lower inner quadrant, from prior lumpectomy. Mammographic images were processed with CAD. IMPRESSION: No mammographic evidence of malignancy in either breast, status post left lumpectomy. RECOMMENDATION: Diagnostic mammogram is suggested in 1 year. (Code:DM-B-01Y) I have discussed the findings and recommendations with the patient. Results were also provided in writing at the conclusion of the visit. If applicable, a reminder letter will be sent to the patient regarding the next appointment. BI-RADS CATEGORY  2: Benign. Electronically Signed   By: Fidela Salisbury M.D.   On: 03/14/2018 12:58     ASSESSMENT: 77 y.o.  [place] woman status post left breast lower inner quadrant biopsy 02/14/2014 for a clinical T1b N0, stage IA invasive ductal carcinoma, grade 2, estrogen and progesterone receptor positive, with a low MIB-1-at 10%, and HER-2 results negative  (1) status post left lumpectomy and sentinel lymph node sampling 03/11/2014 for a pT1c pN0, Stage IA invasive ductal carcinoma, grade 2, repeat HER-2 again negative, with negative margins  (2) Adjuvant radiation completed 05/14/2014 [44 Gy at 2 Gy per fraction x 22 fractions to the left breast]  (3) started anastrozole 06/27/2014  (a) bone density 10/01/2013 was  normal   PLAN: Keriana is now 4 years out from definitive surgery for her breast cancer with no evidence of disease recurrence.  This is very favorable.  She is tolerating anastrozole well and the plan will be to continue that for a total of 5 years.  That means when she sees me next year she will be ready to "graduate".  She is considering knee surgery but is very reluctant to proceed.  At this point she is very limited in her activities largely because of the knee problem  She knows to call for any other issues that may develop before the next visit.   Jannelly Bergren, Virgie Dad, MD  04/11/18 2:12 PM Medical Oncology and Hematology [facility] [address] [place], [place] [address] Tel. [phone]    Fax. [phone]  Alice Rieger, am acting as scribe for Chauncey Cruel MD.  I, Lurline Del MD, have reviewed the above documentation for accuracy and completeness, and I agree with the above.

## 2018-04-11 ENCOUNTER — Inpatient Hospital Stay: Payer: Medicare Other

## 2018-04-11 ENCOUNTER — Inpatient Hospital Stay: Payer: Medicare Other | Attending: Oncology | Admitting: Oncology

## 2018-04-11 ENCOUNTER — Telehealth: Payer: Self-pay | Admitting: Oncology

## 2018-04-11 VITALS — BP 138/60 | HR 72 | Temp 98.1°F | Resp 18 | Ht 64.0 in | Wt 235.6 lb

## 2018-04-11 DIAGNOSIS — C50312 Malignant neoplasm of lower-inner quadrant of left female breast: Secondary | ICD-10-CM

## 2018-04-11 DIAGNOSIS — N951 Menopausal and female climacteric states: Secondary | ICD-10-CM | POA: Diagnosis not present

## 2018-04-11 DIAGNOSIS — Z170 Estrogen receptor positive status [ER+]: Secondary | ICD-10-CM | POA: Insufficient documentation

## 2018-04-11 LAB — CMP (CANCER CENTER ONLY)
ALK PHOS: 90 U/L (ref 38–126)
ALT: 28 U/L (ref 0–44)
AST: 26 U/L (ref 15–41)
Albumin: 3.6 g/dL (ref 3.5–5.0)
Anion gap: 8 (ref 5–15)
BILIRUBIN TOTAL: 0.3 mg/dL (ref 0.3–1.2)
BUN: 16 mg/dL (ref 8–23)
CALCIUM: 9.6 mg/dL (ref 8.9–10.3)
CO2: 27 mmol/L (ref 22–32)
CREATININE: 0.76 mg/dL (ref 0.44–1.00)
Chloride: 105 mmol/L (ref 98–111)
GFR, Est AFR Am: >60 mL/min (ref >60)
Glucose, Bld: 79 mg/dL (ref 70–99)
Potassium: 4 mmol/L (ref 3.5–5.1)
Sodium: 140 mmol/L (ref 135–145)
Total Protein: 7.3 g/dL (ref 6.5–8.1)

## 2018-04-11 LAB — CBC WITH DIFFERENTIAL (CANCER CENTER ONLY)
Basophils Absolute: 0.1 10*3/uL (ref 0.0–0.1)
Basophils Relative: 1 %
EOS PCT: 2 %
Eosinophils Absolute: 0.2 10*3/uL (ref 0.0–0.5)
HEMATOCRIT: 38.5 % (ref 34.8–46.6)
HEMOGLOBIN: 12.8 g/dL (ref 11.6–15.9)
LYMPHS ABS: 3.2 10*3/uL (ref 0.9–3.3)
LYMPHS PCT: 31 %
MCH: 29.4 pg (ref 25.1–34.0)
MCHC: 33.2 g/dL (ref 31.5–36.0)
MCV: 88.4 fL (ref 79.5–101.0)
Monocytes Absolute: 1 10*3/uL — ABNORMAL HIGH (ref 0.1–0.9)
Monocytes Relative: 9 %
NEUTROS ABS: 6 10*3/uL (ref 1.5–6.5)
NEUTROS PCT: 57 %
Platelet Count: 258 10*3/uL (ref 145–400)
RBC: 4.36 MIL/uL (ref 3.70–5.45)
RDW: 13.6 % (ref 11.2–14.5)
WBC Count: 10.6 10*3/uL — ABNORMAL HIGH (ref 3.9–10.3)

## 2018-04-11 MED ORDER — ANASTROZOLE 1 MG PO TABS: 1.0000 mg | ORAL_TABLET | Freq: Every day | ORAL | 4 refills | Status: DC

## 2018-04-11 NOTE — Telephone Encounter (Signed)
Gave patient avs and calendar of upcoming appts.  °

## 2018-06-09 ENCOUNTER — Encounter (HOSPITAL_COMMUNITY): Payer: Self-pay | Admitting: *Deleted

## 2018-07-04 ENCOUNTER — Other Ambulatory Visit (HOSPITAL_COMMUNITY): Payer: Medicare Other

## 2018-07-10 ENCOUNTER — Other Ambulatory Visit: Payer: Self-pay | Admitting: Oncology

## 2018-07-11 ENCOUNTER — Encounter (HOSPITAL_COMMUNITY): Payer: Self-pay

## 2018-07-11 ENCOUNTER — Inpatient Hospital Stay (HOSPITAL_COMMUNITY): Admit: 2018-07-11 | Payer: Medicare Other | Admitting: Orthopedic Surgery

## 2018-07-11 SURGERY — ARTHROPLASTY, KNEE, TOTAL
Anesthesia: Spinal | Site: Knee | Laterality: Right

## 2018-08-31 ENCOUNTER — Ambulatory Visit
Admission: RE | Admit: 2018-08-31 | Discharge: 2018-08-31 | Disposition: A | Discharge status: Home / Self Care | Payer: Medicare Other | Source: Ambulatory Visit | Attending: Family Medicine | Admitting: Family Medicine

## 2018-08-31 ENCOUNTER — Other Ambulatory Visit: Payer: Self-pay | Admitting: Family Medicine

## 2018-08-31 DIAGNOSIS — M545 Low back pain, unspecified: Secondary | ICD-10-CM

## 2019-01-29 ENCOUNTER — Other Ambulatory Visit: Payer: Self-pay | Admitting: Oncology

## 2019-01-29 DIAGNOSIS — Z1231 Encounter for screening mammogram for malignant neoplasm of breast: Secondary | ICD-10-CM

## 2019-03-14 ENCOUNTER — Other Ambulatory Visit: Payer: Self-pay | Admitting: Oncology

## 2019-03-14 DIAGNOSIS — Z853 Personal history of malignant neoplasm of breast: Secondary | ICD-10-CM

## 2019-03-16 ENCOUNTER — Ambulatory Visit
Admission: RE | Admit: 2019-03-16 | Discharge: 2019-03-16 | Disposition: A | Discharge status: Home / Self Care | Payer: Medicare Other | Source: Ambulatory Visit | Attending: Oncology | Admitting: Oncology

## 2019-03-16 ENCOUNTER — Other Ambulatory Visit: Payer: Self-pay

## 2019-03-16 ENCOUNTER — Other Ambulatory Visit: Payer: Self-pay | Admitting: Oncology

## 2019-03-16 DIAGNOSIS — Z853 Personal history of malignant neoplasm of breast: Secondary | ICD-10-CM

## 2019-04-10 ENCOUNTER — Encounter: Payer: Self-pay | Admitting: Oncology

## 2019-04-10 ENCOUNTER — Inpatient Hospital Stay: Payer: Medicare Other | Attending: Oncology

## 2019-04-10 ENCOUNTER — Other Ambulatory Visit: Payer: Self-pay

## 2019-04-10 ENCOUNTER — Inpatient Hospital Stay: Payer: Medicare Other | Admitting: Oncology

## 2019-04-10 VITALS — BP 118/53 | HR 75 | Temp 98.2°F | Resp 18 | Wt 218.8 lb

## 2019-04-10 DIAGNOSIS — C50312 Malignant neoplasm of lower-inner quadrant of left female breast: Secondary | ICD-10-CM

## 2019-04-10 DIAGNOSIS — R200 Anesthesia of skin: Secondary | ICD-10-CM | POA: Insufficient documentation

## 2019-04-10 DIAGNOSIS — Z170 Estrogen receptor positive status [ER+]: Secondary | ICD-10-CM

## 2019-04-10 LAB — CBC WITH DIFFERENTIAL/PLATELET
Abs Immature Granulocytes: 0.04 10*3/uL (ref 0.00–0.07)
Basophils Absolute: 0.1 10*3/uL (ref 0.0–0.1)
Basophils Relative: 1 %
Eosinophils Absolute: 0.3 10*3/uL (ref 0.0–0.5)
Eosinophils Relative: 3 %
HCT: 40.6 % (ref 36.0–46.0)
Hemoglobin: 13.1 g/dL (ref 12.0–15.0)
Immature Granulocytes: 0 %
Lymphocytes Relative: 32 %
Lymphs Abs: 3.5 10*3/uL (ref 0.7–4.0)
MCH: 29.2 pg (ref 26.0–34.0)
MCHC: 32.3 g/dL (ref 30.0–36.0)
MCV: 90.6 fL (ref 80.0–100.0)
Monocytes Absolute: 1 10*3/uL (ref 0.1–1.0)
Monocytes Relative: 9 %
Neutro Abs: 6.1 10*3/uL (ref 1.7–7.7)
Neutrophils Relative %: 55 %
Platelets: 279 10*3/uL (ref 150–400)
RBC: 4.48 MIL/uL (ref 3.87–5.11)
RDW: 13.2 % (ref 11.5–15.5)
WBC: 11 10*3/uL — ABNORMAL HIGH (ref 4.0–10.5)
nRBC: 0 % (ref 0.0–0.2)

## 2019-04-10 LAB — COMPREHENSIVE METABOLIC PANEL
ALT: 30 U/L (ref 0–44)
AST: 30 U/L (ref 15–41)
Albumin: 3.7 g/dL (ref 3.5–5.0)
Alkaline Phosphatase: 81 U/L (ref 38–126)
Anion gap: 10 (ref 5–15)
BUN: 24 mg/dL — ABNORMAL HIGH (ref 8–23)
CO2: 27 mmol/L (ref 22–32)
Calcium: 9.5 mg/dL (ref 8.9–10.3)
Chloride: 105 mmol/L (ref 98–111)
Creatinine, Ser: 0.85 mg/dL (ref 0.44–1.00)
GFR calc Af Amer: >60 mL/min (ref >60)
GFR calc non Af Amer: >60 mL/min (ref >60)
Glucose, Bld: 77 mg/dL (ref 70–99)
Potassium: 4.2 mmol/L (ref 3.5–5.1)
Sodium: 142 mmol/L (ref 135–145)
Total Bilirubin: 0.3 mg/dL (ref 0.3–1.2)
Total Protein: 7.6 g/dL (ref 6.5–8.1)

## 2019-04-10 NOTE — Progress Notes (Signed)
[place]  [phone] [phone]     ID: Candice Reid OB: 02-06-1941  MR#: [id number]  HTX#:[id number]  Patient Care Team: Candice Dus, [facility]ice [place] as PCP - General (Family Medicine) Candice Fus, [place] as Consulting Physician (Obstetrics and Gynecology) Candice Newcomer, [place]ice [place] as [facility]ice [place] Physician (Radiology) [place], Virgie Dad, [place]ice [place] as Consulting Physician (Oncology) Candice Cancel, [place]ice [place] as Consulting Physician (Orthopedic Surgery) OTHER Candice [place]:   CHIEF COMPLAINT: Estrogen receptor positive early-stage breast cancer  CURRENT TREATMENT: Completing 5 years of anastrozole   INTERVAL HISTORY: Candice [place] returns today for follow-up and treatment of her estrogen receptor positive breast cancer.   She continues on anastrozole, with good tolerance. She reports improvement to her hot flashes with Candice clonidine patch. She denies any vaginal dryness.  Since her last visit, she presented to her annual mammogram and reported 3 days of a focal ache in Candice upper-outer quadrant of Candice left breast. She underwent bilateral diagnostic mammography with tomography and left breast ultrasound at [facility] on 03/16/2019 showing: breast density category B; no suspicious mammographic or targeted sonographic findings at Candice site of focal tenderness in Candice upper-outer left breast; no evidence of malignancy in either breast.  She also underwent lumbar spine x-ray on 08/31/2018, which showed: multilevel degenerative disc and facet joint change; moderate dextrocurvature centered at L3 which is slightly more conspicuous than previous; no compression fracture or spondylolisthesis nor other acute bony abnormality.   REVIEW OF SYSTEMS: Candice Reid reports she is having trouble with her left hand and arm "falling asleep." She states it happens when she's driving and even when she's not doing anything. She's not entirely sure if it happens on Candice right, "but if it does, it's seldom." Prior to Candice pandemic  shutdown, she was [facility] to participate in [facility] aerobics. She states she attends church, and Candice attendants social distant. She notes most people wear their masks. A detailed review of systems was otherwise stable.     BREAST CANCER HISTORY: From Candice original intake note:   That he had routine screening mammography may 2015 showing a possible mass in Candice left breast. On 02/08/2014 she had left diagnostic mammography and ultrasonography of [facility] Center, showing breast density category B. Otherwise a 1.1 cm ovoid mass in Candice inner lower quadrant of Candice left breast. Ultrasound confirmed an irregularly shaped mass measuring 1.0 cm. Ultrasound of Candice left axilla was unremarkable.  Biopsy of Candice left breast mass in question 02/14/2014 showed (S8 3:[id number]) and invasive ductal carcinoma, grade 2, estrogen and progesterone receptor positive, with an MIB-1 of 10%. HER-2 is pending.  Candice patient's subsequent history is as detailed below   PAST MEDICAL HISTORY: Past Medical History:  Diagnosis Date   Anxiety  Arthritis    Asthma    Breast cancer ([place]) 1740   Complication of anesthesia    shaky-hard to wake up   Dental bridge present    Depression    Diabetes ([place])    Fatty liver    GERD (gastroesophageal reflux disease)    Hemorrhoids    Hot flashes    HTN (hypertension)    Hyperlipemia    Hypertension    Kidney stones    Neuropathy    Obesity    Obstructive sleep apnea    Osteoarthritis bilteral knees   Personal history of radiation therapy 2015   Phobia    Plantar fasciitis    Radiation 05/14/14   44 Gy Left breast   Wears glasses    reading    PAST SURGICAL HISTORY: Past Surgical History:   Procedure Laterality Date   BREAST LUMPECTOMY  1985   left-neg   BREAST LUMPECTOMY Left 03/11/2014   left   COLONOSCOPY  03/11/2011   diverticulosis, internal hemorrhoids   CYSTOSCOPY W/ URETERAL STENT PLACEMENT     removed   DILATION AND CURETTAGE OF UTERUS     KNEE SURGERY Left    x 2-left   OOPHORECTOMY Bilateral    VAGINAL HYSTERECTOMY  1988   endometriosis, fibroids    FAMILY HISTORY Family History  Problem Relation Age of Onset   Diabetes Mother    Colon polyps Sister    Lung cancer Sister    Ovarian cancer Sister    Brain cancer Sister    Prostate cancer Father    Brain cancer Brother    Colon polyps Maternal Aunt    Renal Disease Sister    Arthritis Other        Neice   Colon cancer Neg Hx   Candice patient's father died from cancer, unknown type, at Candice age of 65. Candice patient's mother died from a stroke in Candice setting of diabetes at Candice age of 12. Candice patient had one brother, 3 sisters. One sister had lung cancer diagnosed at age 59. Another had brain cancer Candice age of 77. One brother also had brain cancer, at Candice age of 70. There is no history of breast or ovarian cancer in Candice family to Candice patient's knowledge  GYNECOLOGIC HISTORY:  Menarche age 82, first live birth age 85, Candice patient is [place]. She is status post remote hysterectomy with bilateral salpingo-oophorectomy. She took hormone replacement approximately 20 years, stopping approximately 2010.   SOCIAL HISTORY:  [place]. She is now retired. she is widowed and lives alone with her dog. Her daughter Candice Reid "Candice [place]"  [place] lives in [place] and works in Press photographer. Candice patient's son Candice Reid, where he is a Airline pilot. Candice patient has 3 grandchildren. She attends [employer]    ADVANCED DIRECTIVES: Not in place. These were discussed at Candice initial visit 02/20/2014. Candice patient tells me she has Candice  appropriate documents in hand and intends to name her daughter Candice Reid as healthcare power of attorney. Candice Reid can be reached at [place]: Social History   Tobacco Use   Smoking status: Never Smoker   Smokeless tobacco: Never Used  Substance Use Topics   Alcohol use: No   Drug use: No     Colonoscopy: 2013/ [facility]  PAP: Status post hysterectomy  Bone density:10/11/2013 at [facility], normal (lowest T score of -0.2   Lipid  panel:  Allergies  Allergen Reactions   Ace Inhibitors Cough   Metronidazole Nausea Only   Morphine And Related Itching   Oxycodone-Acetaminophen Nausea Only   Septra [Sulfamethoxazole-Trimethoprim] Nausea Only   Sulfa Antibiotics Nausea Only   Valium [Diazepam] Other (See Comments)    depression    Current Outpatient Medications  Medication Sig Dispense Refill   ALPRAZolam (XANAX) 0.5 MG tablet Take 0.5 mg by mouth 3 (three) times daily as needed for anxiety. 1/2 as needed     anastrozole (ARIMIDEX) 1 MG tablet Take 1 tablet (1 mg total) by mouth daily. 90 tablet 4   cholecalciferol (VITAMIN D) 1000 units tablet Take 1,000 Units by mouth daily.     cloNIDine (CATAPRES - DOSED IN MG/24 HR) 0.1 mg/24hr patch PLACE 1 PATCH ONTO Candice SKIN ONCE A WEEK 4 patch 12   DULoxetine (CYMBALTA) 60 MG capsule Take 60 mg by mouth daily.     ketoconazole (NIZORAL) 2 % cream Apply 1 application topically daily. 15 g 6   metoprolol (TOPROL-XL) 100 MG 24 hr tablet Take 100 mg by mouth daily.       OMEGA-3 1000 MG CAPS Take 1 capsule by mouth daily.       omeprazole (PRILOSEC OTC) 20 MG tablet Take 20 mg by mouth daily.       pravastatin (PRAVACHOL) 40 MG tablet Take 40 mg by mouth every Monday, Wednesday, and Friday.     valsartan-hydrochlorothiazide (DIOVAN-HCT) 320-25 MG per tablet Take 1 tablet by mouth daily.     VITAMIN D, CHOLECALCIFEROL, PO Take 200 Units by mouth daily.      No current facility-administered medications for  this visit.     OBJECTIVE: Older white woman walking with a cane  Vitals:   04/10/19 1330  BP: (!) 118/53  Pulse: 75  Resp: 18  Temp: 98.2 F (36.8 C)  SpO2: 95%     Body mass index is 37.56 kg/m.    ECOG FS:1 - Symptomatic but completely ambulatory  Sclerae unicteric, pupils round and equal Wearing a mask No cervical or supraclavicular adenopathy Lungs no rales or rhonchi Heart regular rate and rhythm Abd soft, nontender, positive bowel sounds MSK no focal spinal tenderness, no upper extremity lymphedema Neuro: nonfocal, well oriented, appropriate affect Breasts: Candice right breast is unremarkable.  Candice left breast has undergone lumpectomy and radiation.  There is no evidence of local recurrence.  Both axillae are benign.  LAB RESULTS:  CMP     Component Value Date/Time   NA 142 04/10/2019 1315   NA 143 03/16/2017 1258   K 4.2 04/10/2019 1315   K 4.0 03/16/2017 1258   CL 105 04/10/2019 1315   CO2 27 04/10/2019 1315   CO2 26 03/16/2017 1258   GLUCOSE 77 04/10/2019 1315   GLUCOSE 118 03/16/2017 1258   BUN 24 (H) 04/10/2019 1315   BUN 13.5 03/16/2017 1258   CREATININE 0.85 04/10/2019 1315   CREATININE 0.76 04/11/2018 1258   CREATININE 0.8 03/16/2017 1258   CALCIUM 9.5 04/10/2019 1315   CALCIUM 9.9 03/16/2017 1258   PROT 7.6 04/10/2019 1315   PROT 7.3 03/16/2017 1258   ALBUMIN 3.7 04/10/2019 1315   ALBUMIN 3.5 03/16/2017 1258   AST 30 04/10/2019 1315   AST 26 04/11/2018 1258   AST 40 (H) 03/16/2017 1258   ALT 30 04/10/2019 1315   ALT 28 04/11/2018 1258   ALT 38 03/16/2017 1258   ALKPHOS 81 04/10/2019 1315   ALKPHOS 82 03/16/2017  1258   BILITOT 0.3 04/10/2019 1315   BILITOT 0.3 04/11/2018 1258   BILITOT 0.43 03/16/2017 1258   GFRNONAA >60 04/10/2019 1315   GFRNONAA >60 04/11/2018 1258   GFRAA >60 04/10/2019 1315   GFRAA >60 04/11/2018 1258    I No results found for: SPEP  Lab Results  Component Value Date   WBC 11.0 (H) 04/10/2019   NEUTROABS 6.1  04/10/2019   HGB 13.1 04/10/2019   HCT 40.6 04/10/2019   MCV 90.6 04/10/2019   PLT 279 04/10/2019      Chemistry      Component Value Date/Time   NA 142 04/10/2019 1315   NA 143 03/16/2017 1258   K 4.2 04/10/2019 1315   K 4.0 03/16/2017 1258   CL 105 04/10/2019 1315   CO2 27 04/10/2019 1315   CO2 26 03/16/2017 1258   BUN 24 (H) 04/10/2019 1315   BUN 13.5 03/16/2017 1258   CREATININE 0.85 04/10/2019 1315   CREATININE 0.76 04/11/2018 1258   CREATININE 0.8 03/16/2017 1258      Component Value Date/Time   CALCIUM 9.5 04/10/2019 1315   CALCIUM 9.9 03/16/2017 1258   ALKPHOS 81 04/10/2019 1315   ALKPHOS 82 03/16/2017 1258   AST 30 04/10/2019 1315   AST 26 04/11/2018 1258   AST 40 (H) 03/16/2017 1258   ALT 30 04/10/2019 1315   ALT 28 04/11/2018 1258   ALT 38 03/16/2017 1258   BILITOT 0.3 04/10/2019 1315   BILITOT 0.3 04/11/2018 1258   BILITOT 0.43 03/16/2017 1258       No results found for: LABCA2  No components found for: LABCA125  No results for input(s): INR in Candice last 168 hours.  Urinalysis No results found for: COLORURINE   STUDIES: US Breast Ltd Uni Left Inc Axilla  Result Date: 03/16/2019 CLINICAL DATA:  Routine annual surveillance status post left breast lumpectomy in 2015.For Candice last 3 days Candice patient has been feeling a focal ache in Candice upper-outer quadrant of Candice left breast. EXAM: DIGITAL DIAGNOSTIC BILATERAL MAMMOGRAM WITH CAD AND TOMO ULTRASOUND LEFT BREAST COMPARISON:  Previous exam(s). ACR Breast Density Category b: There are scattered areas of fibroglandular density. FINDINGS: Candice left breast lumpectomy site is stable. Deep to Candice BB marking Candice site of tenderness in Candice upper-outer left breast, no suspicious mammographic findings are identified. No suspicious calcifications, masses or areas of distortion are seen in Candice bilateral breasts. Mammographic images were processed with CAD. Targeted ultrasound is performed, showing normal fibroglandular  tissue in Candice upper-outer left breast at Candice site of focal tenderness. IMPRESSION: 1. There are no suspicious mammographic or targeted sonographic findings at Candice site of focal tenderness in Candice upper-outer left breast. 2. Stable left breast lumpectomy site. No mammographic evidence of malignancy in Candice bilateral breasts. RECOMMENDATION: 1. Clinical follow-up recommended for Candice tender area of concern in Candice upper-outer left breast. Any further workup should be based on clinical grounds. 2.  Screening mammogram in one year.(Code:SM-B-01Y) I have discussed Candice findings and recommendations with Candice patient. Results were also provided in writing at Candice conclusion of Candice visit. If applicable, a reminder letter will be sent to Candice patient regarding Candice next appointment. BI-RADS CATEGORY  2: Benign. Electronically Candice [place]   By: Ammie Ferrier M.D.   On: 03/16/2019 12:01   Mm Diag Breast Tomo Bilateral  Result Date: 03/16/2019 CLINICAL DATA:  Routine annual surveillance status post left breast lumpectomy in 2015.For Candice last 3 days Candice patient has been  feeling a focal ache in Candice upper-outer quadrant of Candice left breast. EXAM: DIGITAL DIAGNOSTIC BILATERAL MAMMOGRAM WITH CAD AND TOMO ULTRASOUND LEFT BREAST COMPARISON:  Previous exam(s). ACR Breast Density Category b: There are scattered areas of fibroglandular density. FINDINGS: Candice left breast lumpectomy site is stable. Deep to Candice BB marking Candice site of tenderness in Candice upper-outer left breast, no suspicious mammographic findings are identified. No suspicious calcifications, masses or areas of distortion are seen in Candice bilateral breasts. Mammographic images were processed with CAD. Targeted ultrasound is performed, showing normal fibroglandular tissue in Candice upper-outer left breast at Candice site of focal tenderness. IMPRESSION: 1. There are no suspicious mammographic or targeted sonographic findings at Candice site of focal tenderness in Candice upper-outer left breast. 2.  Stable left breast lumpectomy site. No mammographic evidence of malignancy in Candice bilateral breasts. RECOMMENDATION: 1. Clinical follow-up recommended for Candice tender area of concern in Candice upper-outer left breast. Any further workup should be based on clinical grounds. 2.  Screening mammogram in one year.(Code:SM-B-01Y) I have discussed Candice findings and recommendations with Candice patient. Results were also provided in writing at Candice conclusion of Candice visit. If applicable, a reminder letter will be sent to Candice patient regarding Candice next appointment. BI-RADS CATEGORY  2: Benign. Electronically [facility]   By: Ammie Ferrier M.D.   On: 03/16/2019 12:01     ASSESSMENT: 78 y.o.  [place] woman status post left breast lower inner quadrant biopsy 02/14/2014 for a clinical T1b N0, stage IA invasive ductal carcinoma, grade 2, estrogen and progesterone receptor positive, with a low MIB-1-at 10%, and HER-2 results negative  (1) status post left lumpectomy and sentinel lymph node sampling 03/11/2014 for a pT1c pN0, Stage IA invasive ductal carcinoma, grade 2, repeat HER-2 again negative, with negative margins  (2) Adjuvant radiation completed 05/14/2014 [44 Gy at 2 Gy per fraction x 22 fractions to Candice left breast]  (3) started anastrozole 06/27/2014, completing 5 years of July 2020  (a) bone density 10/01/2013 was normal   PLAN: Tymika is now just over 5 years out from definitive surgery for her breast cancer with no evidence of disease recurrence.  This is very favorable.  She is completing 5 years of anastrozole.  By taking this drug for 5 years she has reduced her already low risk of recurrence by almost 50% and she has cut in half her risk of developing a new breast cancer in Candice future.  At this point I feel comfortable releasing her to her primary care physician care.  All she will need in terms of breast cancer follow-up is a yearly mammography and yearly physician breast exam.  Candice numbness that she  is feeling in Candice left arm and sometimes Candice hand could well be due to arthritis in her neck.  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  It could also be due, less likely, to some scar tissue from her prior surgery.  She will be discussing all this with her primary care physician when she meets them again in September for her wellness visit  I will be glad to see Kirsta again at any point in Candice future if Candice need arises but as of now are making no further routine appointments for her here.  Kaiyon Hynes, Virgie Dad, Candice [place]  04/10/19 2:07 PM Medical Oncology and Hematology [facility] [address] [place], [place] [address] Tel. [phone]    Fax. [phone]  I, Wilburn Mylar, am acting as scribe for Candice [place]. Sarajane Jews C. Demarquis Osley.  I, Lurline Del Candice [place], have reviewed Candice above documentation for accuracy and completeness, and I agree with Candice above.

## 2019-08-01 ENCOUNTER — Encounter: Payer: Self-pay | Admitting: Cardiology

## 2019-08-01 ENCOUNTER — Ambulatory Visit: Payer: Medicare Other | Admitting: Cardiology

## 2019-08-01 ENCOUNTER — Other Ambulatory Visit: Payer: Self-pay

## 2019-08-01 VITALS — BP 112/78 | HR 66 | Ht 64.0 in | Wt 219.4 lb

## 2019-08-01 DIAGNOSIS — E785 Hyperlipidemia, unspecified: Secondary | ICD-10-CM | POA: Diagnosis not present

## 2019-08-01 DIAGNOSIS — R002 Palpitations: Secondary | ICD-10-CM | POA: Diagnosis not present

## 2019-08-01 DIAGNOSIS — I10 Essential (primary) hypertension: Secondary | ICD-10-CM

## 2019-08-01 NOTE — Progress Notes (Signed)
Cardiology Office Note:    Date:  08/01/2019   ID:  Candice Reid, DOB 12/21/1940, MRN [id number]  PCP:  Maury Dus, MD  Cardiologist:  No primary care provider on file.  Electrophysiologist:  None   Referring MD: Maury Dus, MD   Chief Complaint  Patient presents with  . Palpitations    History of Present Illness:    Candice Reid is a 78 y.o. female with a hx of obesity, OSA, breast cancer, peripheral vascular disease, hyperlipidemia, type 2 diabetes, hypertension, and NAFLD who is referred by Dr. Alyson Ingles for an evaluation of palpitations.  She reports that she gets episodes of palpitations that occur about once per month.  She states sometimes it only lasts for a few seconds but other times can have sustained period where it feels like heart is racing.  She had one ED visit for this years ago.  She had a recent episode of palpitations after getting a shingles vaccine.  She denies any chest pain, lightheadedness, syncope.  Does report that she has some dyspnea on exertion.  She tries to limit her caffeine intake, currently drinks one 8 ounce to 12 ounce Coke per day, otherwise does not drink caffeine.  No smoking history.  No family history of heart disease.   Past Medical History:  Diagnosis Date  . Anxiety                                                                                                                                                                                                                                                                  . Arthritis   . Asthma   . Breast cancer ([place]) 2015  . Complication of anesthesia    shaky-hard to wake up  . Dental bridge present   . Depression   . Diabetes ([place])   . Fatty liver   . GERD (gastroesophageal reflux disease)   . Hemorrhoids   . Hot flashes   . HTN (hypertension)   . Hyperlipemia   . Hypertension   . Kidney stones   . Neuropathy   . Obesity   . Obstructive sleep apnea   . Osteoarthritis  bilteral knees  . Personal history of radiation therapy 2015  . Phobia   .  Plantar fasciitis   . Radiation 05/14/14   44 Gy Left breast  . Wears glasses    reading    Past Surgical History:  Procedure Laterality Date  . BREAST LUMPECTOMY  1985   left-neg  . BREAST LUMPECTOMY Left 03/11/2014   left  . COLONOSCOPY  03/11/2011   diverticulosis, internal hemorrhoids  . CYSTOSCOPY W/ URETERAL STENT PLACEMENT     removed  . DILATION AND CURETTAGE OF UTERUS    . KNEE SURGERY Left    x 2-left  . OOPHORECTOMY Bilateral   . VAGINAL HYSTERECTOMY  1988   endometriosis, fibroids    Current Medications: Current Meds  Medication Sig  . ALPRAZolam (XANAX) 0.5 MG tablet Take 0.5 mg by mouth 3 (three) times daily as needed for anxiety. 1/2 as needed  . cholecalciferol (VITAMIN D) 1000 units tablet Take 1,000 Units by mouth daily.  . DULoxetine (CYMBALTA) 60 MG capsule Take 60 mg by mouth daily.  [name] Kitchen ketoconazole (NIZORAL) 2 % cream Apply 1 application topically daily.  . metoprolol (TOPROL-XL) 100 MG 24 hr tablet Take 100 mg by mouth daily.    . OMEGA-3 1000 MG CAPS Take 1 capsule by mouth daily.    [name] Kitchen omeprazole (PRILOSEC OTC) 20 MG tablet Take 20 mg by mouth daily.    . pravastatin (PRAVACHOL) 40 MG tablet Take 40 mg by mouth every Monday, Wednesday, and Friday.  . valsartan-hydrochlorothiazide (DIOVAN-HCT) 320-25 MG per tablet Take 1 tablet by mouth daily.  [name] Kitchen VITAMIN D, CHOLECALCIFEROL, PO Take 200 Units by mouth daily.      Allergies:   Ciprofloxacin, Oxycodone-acetaminophen, Tyloxapol, Ace inhibitors, Metronidazole, Morphine and related, Oxycodone-acetaminophen, Septra [sulfamethoxazole-trimethoprim], Sulfa antibiotics, and Valium [diazepam]   Social History   Socioeconomic History  . Marital status: Widowed    Spouse name: Not on file  . Number of children: 2  . Years of education: Not on file  . Highest education level: Not on file  Occupational History  . Occupation: Retired    Scientific laboratory technician  . Financial resource strain: Not on file  . Food insecurity    Worry: Not on file    Inability: Not on file  . Transportation needs    Medical: Not on file    Non-medical: Not on file  Tobacco Use  . Smoking status: Never Smoker  . Smokeless tobacco: Never Used  Substance and Sexual Activity  . Alcohol use: No  . Drug use: No  . Sexual activity: Not on file  Lifestyle  . Physical activity    Days per week: Not on file    Minutes per session: Not on file  . Stress: Not on file  Relationships  . Social Herbalist on phone: Not on file    Gets together: Not on file    Attends religious service: Not on file    Active member of club or organization: Not on file    Attends meetings of clubs or organizations: Not on file    Relationship status: Not on file  Other Topics Concern  . Not on file  Social History Narrative   1-2 caffeine drinks daily      Family History: The patient's family history includes Arthritis in an other family member; Brain cancer in her brother and sister; Colon polyps in her maternal aunt and sister; Diabetes in her mother; Lung cancer in her sister; Ovarian cancer in her sister; Prostate cancer in her father; Renal Disease in her sister. There is  no history of Colon cancer.  ROS:   Please see the history of present illness.    All other systems reviewed and are negative.  EKGs/Labs/Other Studies Reviewed:    The following studies were reviewed today:   EKG:  EKG is ordered today.  The ekg ordered today demonstrates sinus rhythm, rate 66, motion artifact, no ST/T abnormalities  Recent Labs: 04/10/2019: ALT 30; BUN 24; Creatinine, Ser 0.85; Hemoglobin 13.1; Platelets 279; Potassium 4.2; Sodium 142  Recent Lipid Panel No results found for: CHOL, TRIG, HDL, CHOLHDL, VLDL, LDLCALC, LDLDIRECT  Physical Exam:    VS:  BP 112/78   Pulse 66   Ht 5\' 4"  (1.626 m)   Wt 219 lb 6.4 oz (99.5 kg)   BMI 37.66 kg/m     Wt Readings  from Last 3 Encounters:  08/01/19 219 lb 6.4 oz (99.5 kg)  04/10/19 218 lb 12.8 oz (99.2 kg)  04/11/18 235 lb 9.6 oz (106.9 kg)     GEN: Well nourished, well developed in no acute distress HEENT: Normal NECK: No JVD LYMPHATICS: No lymphadenopathy CARDIAC:RRR, no murmurs, rubs, gallops RESPIRATORY:  Clear to auscultation without rales, wheezing or rhonchi  ABDOMEN: Soft, non-tender, non-distended MUSCULOSKELETAL:  No edema; No deformity  SKIN: Warm and dry NEUROLOGIC:  Alert and oriented x 3 PSYCHIATRIC:  Normal affect   ASSESSMENT:    1. Palpitations   2. Essential hypertension   3. Hyperlipidemia, unspecified hyperlipidemia type    PLAN:    In order of problems listed above:  Palpitations: Concerning for arrhythmia.  Will check 30-day event monitor.  Will check TTE to evaluate for structural heart disease  Hypertension: On metoprolol 100 mg daily and olmesartan 20 mg daily.  Appears contolled.   Hyperlipidemia: On pravastatin 40 mg daily  RTC in 3 months   Medication Adjustments/Labs and Tests Ordered: Current medicines are reviewed at length with the patient today.  Concerns regarding medicines are outlined above.  Orders Placed This Encounter  Procedures  . Cardiac event monitor  . EKG 12-Lead  . ECHOCARDIOGRAM COMPLETE   No orders of the defined types were placed in this encounter.   Patient Instructions  Medication Instructions:  Continue same medications   Lab Work: None ordered   Testing/Procedures:  Schedule 30 day Event Monitor  Schedule Echo  Follow-Up: At [facility], you and your health needs are our priority.  As part of our continuing mission to provide you with exceptional heart care, we have created designated Provider Care Teams.  These Care Teams include your primary Cardiologist (physician) and Advanced Practice Providers (APPs -  Physician Assistants and Nurse Practitioners) who all work together to provide you with the care you  need, when you need it.  Your next appointment:  3 months   The format for your next appointment:  Office   Provider:  Dr.Kayani Rapaport     Signed, Donato Heinz, MD  08/01/2019 6:11 PM    [facility]

## 2019-08-01 NOTE — Patient Instructions (Signed)
Medication Instructions:  Continue same medications   Lab Work: None ordered   Testing/Procedures:  Schedule 30 day Event Monitor  Schedule Echo  Follow-Up: At [facility], you and your health needs are our priority.  As part of our continuing mission to provide you with exceptional heart care, we have created designated Provider Care Teams.  These Care Teams include your primary Cardiologist (physician) and Advanced Practice Providers (APPs -  Physician Assistants and Nurse Practitioners) who all work together to provide you with the care you need, when you need it.  Your next appointment:  3 months   The format for your next appointment:  Office   Provider:  Dr.Schumann

## 2019-08-14 ENCOUNTER — Encounter: Payer: Self-pay | Admitting: Physician Assistant

## 2019-08-15 ENCOUNTER — Other Ambulatory Visit: Payer: Self-pay

## 2019-08-15 ENCOUNTER — Ambulatory Visit (HOSPITAL_COMMUNITY): Payer: Medicare Other | Attending: Cardiovascular Disease

## 2019-08-15 DIAGNOSIS — J45909 Unspecified asthma, uncomplicated: Secondary | ICD-10-CM | POA: Diagnosis not present

## 2019-08-15 DIAGNOSIS — E669 Obesity, unspecified: Secondary | ICD-10-CM | POA: Diagnosis not present

## 2019-08-15 DIAGNOSIS — E119 Type 2 diabetes mellitus without complications: Secondary | ICD-10-CM | POA: Diagnosis not present

## 2019-08-15 DIAGNOSIS — I10 Essential (primary) hypertension: Secondary | ICD-10-CM | POA: Diagnosis not present

## 2019-08-15 DIAGNOSIS — R002 Palpitations: Secondary | ICD-10-CM | POA: Diagnosis not present

## 2019-08-15 DIAGNOSIS — E785 Hyperlipidemia, unspecified: Secondary | ICD-10-CM | POA: Insufficient documentation

## 2019-08-15 DIAGNOSIS — G4733 Obstructive sleep apnea (adult) (pediatric): Secondary | ICD-10-CM | POA: Diagnosis not present

## 2019-08-21 ENCOUNTER — Telehealth: Payer: Self-pay | Admitting: *Deleted

## 2019-08-21 NOTE — Telephone Encounter (Signed)
[employer] to ship a 30 day cardiac event monitor to her home.  Instructions reviewed briefly as they are included in the monitor kit.

## 2019-08-30 ENCOUNTER — Ambulatory Visit: Payer: Medicare Other | Admitting: Physician Assistant

## 2019-08-30 ENCOUNTER — Encounter: Payer: Self-pay | Admitting: Physician Assistant

## 2019-08-30 ENCOUNTER — Ambulatory Visit (INDEPENDENT_AMBULATORY_CARE_PROVIDER_SITE_OTHER): Payer: Medicare Other

## 2019-08-30 VITALS — BP 128/77 | HR 68 | Temp 97.6°F | Ht 64.0 in | Wt 219.0 lb

## 2019-08-30 DIAGNOSIS — K5900 Constipation, unspecified: Secondary | ICD-10-CM

## 2019-08-30 DIAGNOSIS — R002 Palpitations: Secondary | ICD-10-CM | POA: Diagnosis not present

## 2019-08-30 NOTE — Progress Notes (Signed)
Chief Complaint: "Trouble with my bowels"  HPI:    Candice Reid is a 78 year old female with a past medical history as listed below including breast cancer, peripheral vascular disease, OSA, obesity and NAFLD, known to Dr. Carlean Purl, who was referred to me by Maury Dus, MD for a complaint of "trouble with my bowels".     03/11/2011 colonoscopy with diverticulosis in the sigmoid colon and internal hemorrhoids.    05/06/2015 last office visit with Alonza Bogus for right lower quadrant abdominal pain as well as some bright red blood per rectum.  At that time it was discussed that likely her large external hemorrhoids or bleeding.  She is treated with hydrocortisone suppositories twice daily x14 days.  A CT was ordered for her right lower quadrant abdominal pain with history of kidney stones.  CT showed hepatic steatosis and otherwise normal.    08/01/2019 patient has been following with cardiology for palpitations.  Patient had a 30-day event monitor ordered.  Also had an echo.    08/15/2019 echo with a normal LVEF and no significant abnormalities.    Today, the patient explains that for the past 4 months or so she has noticed a small change in her bowel habits noting that she used to go every day before that time and now she goes every couple of days.  A couple of months ago she just "could not go" and she felt sick on her stomach and her abdomen was very painful.  She took Dulcolax and "a stool softener" and eventually after about an hour and a half everything seemed to come out.  Patient tells me that she constantly feels like she "does not empty like I should".  Does use stool softeners about once a week to help things along.  Has never used anything else.  Explains that she was taking a lot of pain medicine for her knees which she feels may have made it worse.    Social history positive for losing her husband over the past year and tells me that "I just do not eat the same as I used to because there is  no one to make meals for".    Denies fever, chills, blood in her stool, abdominal pain or symptoms that awaken her from sleep.     Past Medical History:  Diagnosis Date  . Anxiety                                                                                                                                                                                                                                                                  .  Arthritis   . Asthma   . Breast cancer ([place]) 2015  . Complication of anesthesia    shaky-hard to wake up  . Dental bridge present   . Depression   . Diabetes ([place])   . Fatty liver   . GERD (gastroesophageal reflux disease)   . Hemorrhoids   . Hot flashes   . HTN (hypertension)   . Hyperlipemia   . Hypertension   . Kidney stones   . Neuropathy   . Obesity   . Obstructive sleep apnea   . Osteoarthritis bilteral knees  . Personal history of radiation therapy 2015  . Phobia   . Plantar fasciitis   . Radiation 05/14/14   44 Gy Left breast  . Wears glasses    reading    Past Surgical History:  Procedure Laterality Date  . BREAST LUMPECTOMY  1985   left-neg  . BREAST LUMPECTOMY Left 03/11/2014   left  . COLONOSCOPY  03/11/2011   diverticulosis, internal hemorrhoids  . CYSTOSCOPY W/ URETERAL STENT PLACEMENT     removed  . DILATION AND CURETTAGE OF UTERUS    . KNEE SURGERY Left    x 2-left  . OOPHORECTOMY Bilateral   . VAGINAL HYSTERECTOMY  1988   endometriosis, fibroids    Current Outpatient Medications  Medication Sig Dispense Refill  . ALPRAZolam (XANAX) 0.5 MG tablet Take 0.5 mg by mouth 3 (three) times daily as needed for anxiety. 1/2 as needed    . cholecalciferol (VITAMIN D) 1000 units tablet Take 1,000 Units by mouth daily.    . DULoxetine (CYMBALTA) 60 MG capsule Take 60 mg by mouth daily.    [name] Kitchen ketoconazole (NIZORAL) 2 % cream Apply 1 application topically daily. 15 g 6  . metoprolol (TOPROL-XL) 100 MG 24 hr tablet Take 100 mg by  mouth daily.      . OMEGA-3 1000 MG CAPS Take 1 capsule by mouth daily.      [name] Kitchen omeprazole (PRILOSEC OTC) 20 MG tablet Take 20 mg by mouth daily.      . pravastatin (PRAVACHOL) 40 MG tablet Take 40 mg by mouth every Monday, Wednesday, and Friday.    . valsartan-hydrochlorothiazide (DIOVAN-HCT) 320-25 MG per tablet Take 1 tablet by mouth daily.    [name] Kitchen VITAMIN D, CHOLECALCIFEROL, PO Take 200 Units by mouth daily.      No current facility-administered medications for this visit.     Allergies as of 08/30/2019 - Review Complete 08/01/2019  Allergen Reaction Noted  . Ciprofloxacin  08/01/2019  . Oxycodone-acetaminophen Nausea Only 05/28/2014  . Tyloxapol  08/01/2019  . Ace inhibitors Cough 01/29/2011  . Metronidazole Nausea Only 02/20/2014  . Morphine and related Itching 01/29/2011  . Oxycodone-acetaminophen Nausea Only 01/29/2011  . Septra [sulfamethoxazole-trimethoprim] Nausea Only 02/20/2014  . Sulfa antibiotics Nausea Only 01/29/2011  . Valium [diazepam] Other (See Comments) 02/20/2014    Family History  Problem Relation Age of Onset  . Diabetes Mother   . Colon polyps Sister   . Lung cancer Sister   . Ovarian cancer Sister   . Brain cancer Sister   . Prostate cancer Father   . Brain cancer Brother   . Colon polyps Maternal Aunt   . Renal Disease Sister   . Arthritis Other        Neice  . Colon cancer Neg Hx     Social History   Socioeconomic History  . Marital status: Widowed    Spouse name: Not on file  . Number of  children: 2  . Years of education: Not on file  . Highest education level: Not on file  Occupational History  . Occupation: Retired   Scientific laboratory technician  . Financial resource strain: Not on file  . Food insecurity    Worry: Not on file    Inability: Not on file  . Transportation needs    Medical: Not on file    Non-medical: Not on file  Tobacco Use  . Smoking status: Never Smoker  . Smokeless tobacco: Never Used  Substance and Sexual Activity  .  Alcohol use: No  . Drug use: No  . Sexual activity: Not on file  Lifestyle  . Physical activity    Days per week: Not on file    Minutes per session: Not on file  . Stress: Not on file  Relationships  . Social Herbalist on phone: Not on file    Gets together: Not on file    Attends religious service: Not on file    Active member of club or organization: Not on file    Attends meetings of clubs or organizations: Not on file    Relationship status: Not on file  . Intimate partner violence    Fear of current or ex partner: Not on file    Emotionally abused: Not on file    Physically abused: Not on file    Forced sexual activity: Not on file  Other Topics Concern  . Not on file  Social History Narrative   1-2 caffeine drinks daily     Review of Systems:    Constitutional: No weight loss, fever or chills Skin: No rash Cardiovascular: No chest pain Respiratory: No SOB Gastrointestinal: See HPI and otherwise negative Genitourinary: No dysuria Neurological: No headache Musculoskeletal: No new muscle or joint pain Hematologic: No bleeding  Psychiatric: No history of depression or anxiety   Physical Exam:  Vital signs: BP 128/77   Pulse 68   Temp 97.6 F (36.4 C)   Ht 5\' 4"  (1.626 m)   Wt 219 lb (99.3 kg)   SpO2 98%   BMI 37.59 kg/m   Constitutional:   Pleasant overweight Caucasian female appears to be in NAD, Well developed, Well nourished, alert and cooperative Head:  Normocephalic and atraumatic. Eyes:   PEERL, EOMI. No icterus. Conjunctiva pink. Ears:  Normal auditory acuity. Neck:  Supple Throat: Oral cavity and pharynx without inflammation, swelling or lesion.  Respiratory: Respirations even and unlabored. Lungs clear to auscultation bilaterally.   No wheezes, crackles, or rhonchi.  Cardiovascular: Normal S1, S2. No MRG. Regular rate and rhythm. No peripheral edema, cyanosis or pallor.  Gastrointestinal:  Soft, nondistended, nontender. No rebound or  guarding. Normal bowel sounds. No appreciable masses or hepatomegaly. Rectal:  Not performed.  Msk:  Symmetrical without gross deformities. Without edema, no deformity or joint abnormality.  Neurologic:  Alert and  oriented x4;  grossly normal neurologically.  Skin:   Dry and intact without significant lesions or rashes. Psychiatric: Demonstrates good judgement and reason without abnormal affect or behaviors.  MOST RECENT LABS AND IMAGING: CBC    Component Value Date/Time   WBC 11.0 (H) 04/10/2019 1315   RBC 4.48 04/10/2019 1315   HGB 13.1 04/10/2019 1315   HGB 12.8 04/11/2018 1258   HGB 13.2 03/16/2017 1258   HCT 40.6 04/10/2019 1315   HCT 40.4 03/16/2017 1258   PLT 279 04/10/2019 1315   PLT 258 04/11/2018 1258   PLT 294 03/16/2017 1258  MCV 90.6 04/10/2019 1315   MCV 90.1 03/16/2017 1258   MCH 29.2 04/10/2019 1315   MCHC 32.3 04/10/2019 1315   RDW 13.2 04/10/2019 1315   RDW 13.6 03/16/2017 1258   LYMPHSABS 3.5 04/10/2019 1315   LYMPHSABS 3.1 03/16/2017 1258   MONOABS 1.0 04/10/2019 1315   MONOABS 0.9 03/16/2017 1258   EOSABS 0.3 04/10/2019 1315   EOSABS 0.3 03/16/2017 1258   BASOSABS 0.1 04/10/2019 1315   BASOSABS 0.1 03/16/2017 1258    CMP     Component Value Date/Time   NA 142 04/10/2019 1315   NA 143 03/16/2017 1258   K 4.2 04/10/2019 1315   K 4.0 03/16/2017 1258   CL 105 04/10/2019 1315   CO2 27 04/10/2019 1315   CO2 26 03/16/2017 1258   GLUCOSE 77 04/10/2019 1315   GLUCOSE 118 03/16/2017 1258   BUN 24 (H) 04/10/2019 1315   BUN 13.5 03/16/2017 1258   CREATININE 0.85 04/10/2019 1315   CREATININE 0.76 04/11/2018 1258   CREATININE 0.8 03/16/2017 1258   CALCIUM 9.5 04/10/2019 1315   CALCIUM 9.9 03/16/2017 1258   PROT 7.6 04/10/2019 1315   PROT 7.3 03/16/2017 1258   ALBUMIN 3.7 04/10/2019 1315   ALBUMIN 3.5 03/16/2017 1258   AST 30 04/10/2019 1315   AST 26 04/11/2018 1258   AST 40 (H) 03/16/2017 1258   ALT 30 04/10/2019 1315   ALT 28 04/11/2018 1258    ALT 38 03/16/2017 1258   ALKPHOS 81 04/10/2019 1315   ALKPHOS 82 03/16/2017 1258   BILITOT 0.3 04/10/2019 1315   BILITOT 0.3 04/11/2018 1258   BILITOT 0.43 03/16/2017 1258   GFRNONAA >60 04/10/2019 1315   GFRNONAA >60 04/11/2018 1258   GFRAA >60 04/10/2019 1315   GFRAA >60 04/11/2018 1258    Assessment: 1.  Constipation: Feels like she never empties; consider medication side effect with pain meds for arthritic knees recently +/-age  Plan: 1.  Discussed conservative measures with the patient.  Would recommend that she increase fiber in her diet to at least 25-35 g of fiber per day through the food she is eating and also through a supplement such as Benefiber, Metamucil or Citrucel.  Provided her with a high-fiber diet handout. 2.  Recommend she increase her water intake to the 6-8 eight ounce glasses of water per day 3.  Recommend patient start MiraLAX once daily.  Discussed that she can use this up to 4 times a day.  It should be titrated to 1 soft solid stool per day. 4.  Patient will try the above measures for the next 4 to 6 weeks.  If she continues to feel that she is not emptying correctly then may discuss a colonoscopy.  She will follow up with me.  Ellouise Newer, PA-C [facility] 08/30/2019, 10:50 AM  Cc: Maury Dus, MD

## 2019-08-30 NOTE — Patient Instructions (Signed)
If you are age 77 or older, your body mass index should be between 23-30. Your Body mass index is 37.59 kg/m. If this is out of the aforementioned range listed, please consider follow up with your Primary Care Provider.  If you are age 92 or younger, your body mass index should be between 19-25. Your Body mass index is 37.59 kg/m. If this is out of the aformentioned range listed, please consider follow up with your Primary Care Provider.   Please start a fiber supplement for example- Benefiber  Try to get 25-35 grams of fiber per day through your diet. We have included a hand out for you to read about a High Fiber Diet.  Increase your water intake to 6-8 (8)oz glasses a day.  Start Mirilax daily.  Thank you for choosing me and [facility].

## 2019-09-03 ENCOUNTER — Ambulatory Visit: Payer: Medicare Other | Admitting: Physician Assistant

## 2019-09-14 ENCOUNTER — Telehealth: Payer: Self-pay | Admitting: Cardiology

## 2019-09-14 NOTE — Telephone Encounter (Signed)
New message    Patient calling to report rash on skin due to monitor. Patient states she has removed the monitor

## 2019-09-14 NOTE — Telephone Encounter (Signed)
Pt c/o rash and red itchy area where monitor was placed. Pt has removed monitor. And will be mailing back to company.

## 2019-11-11 NOTE — Progress Notes (Signed)
Cardiology Office Note:    Date:  11/12/2019   ID:  Candice Reid, DOB 1941-06-10, MRN [id number]  PCP:  Maury Dus, MD  Cardiologist:  No primary care provider on file.  Electrophysiologist:  None   Referring MD: Maury Dus, MD   No chief complaint on file.   History of Present Illness:    Candice Reid is a 79 y.o. female with a hx of obesity, OSA, breast cancer, peripheral vascular disease, hyperlipidemia, type 2 diabetes, hypertension, and NAFLD who presents for follow-up.  She was referred by Dr. Alyson Ingles for an evaluation of palpitations on 08/01/2019.  She reports that she gets episodes of palpitations that occur about once per month.  She states sometimes it only lasts for a few seconds but other times can have sustained period where it feels like heart is racing.  She had one ED visit for this years ago.  She had a recent episode of palpitations after getting a shingles vaccine.  She denies any chest pain, lightheadedness, syncope.  Does report that she has some dyspnea on exertion.  She tries to limit her caffeine intake, currently drinks one 8 ounce to 12 ounce Coke per day, otherwise does not drink caffeine.  No smoking history.  No family history of heart disease.  TTE was done on 08/15/2019, which showed normal LV systolic function, moderate LVH, normal RV function, no significant valvular disease.  30-day monitor showed no significant abnormalities.  Since last clinic visit, she reports that she has not been having significant palpitations.  Does report that her heart skips a beat from time to time, not causing any distress.  She denies any dyspnea or chest pain.  Main complaint is lower extremity pain.  Reports she has arthritis in both knees, but has pain throughout her legs with walking.  She has been taking her statin only 1 day a week due to concern that it may be contributing to her leg pain.   Past Medical History:  Diagnosis Date  . Anxiety                                                                                                                                                                                                                                                                   .  Arthritis   . Asthma   . Breast cancer ([place]) 2015  . Complication of anesthesia    shaky-hard to wake up  . Dental bridge present   . Depression   . Diabetes ([place])   . Fatty liver   . GERD (gastroesophageal reflux disease)   . Hemorrhoids   . Hot flashes   . HTN (hypertension)   . Hyperlipemia   . Hypertension   . Kidney stones   . Neuropathy   . Obesity   . Obstructive sleep apnea   . Osteoarthritis bilteral knees  . Personal history of radiation therapy 2015  . Phobia   . Plantar fasciitis   . Radiation 05/14/14   44 Gy Left breast  . Wears glasses    reading    Past Surgical History:  Procedure Laterality Date  . BREAST LUMPECTOMY  1985   left-neg  . BREAST LUMPECTOMY Left 03/11/2014   left  . COLONOSCOPY  03/11/2011   diverticulosis, internal hemorrhoids  . CYSTOSCOPY W/ URETERAL STENT PLACEMENT     removed  . DILATION AND CURETTAGE OF UTERUS    . KNEE SURGERY Left    x 2-left  . OOPHORECTOMY Bilateral   . VAGINAL HYSTERECTOMY  1988   endometriosis, fibroids    Current Medications: Current Meds  Medication Sig  . ALPRAZolam (XANAX) 0.5 MG tablet Take 0.5 mg by mouth 3 (three) times daily as needed for anxiety. 1/2 as needed  . cholecalciferol (VITAMIN D) 1000 units tablet Take 1,000 Units by mouth daily.  . DULoxetine (CYMBALTA) 60 MG capsule Take 60 mg by mouth daily.  [name] Kitchen ketoconazole (NIZORAL) 2 % cream Apply 1 application topically daily.  . metoprolol (TOPROL-XL) 100 MG 24 hr tablet Take 100 mg by mouth daily.    [name] Kitchen olmesartan (BENICAR) 20 MG tablet Take 20 mg by mouth daily.  . OMEGA-3 1000 MG CAPS Take 1 capsule by mouth daily.    [name] Kitchen omeprazole (PRILOSEC OTC) 20 MG tablet Take 20 mg by mouth daily.    . sitaGLIPtin  (JANUVIA) 100 MG tablet Take 100 mg by mouth daily.  . [DISCONTINUED] pravastatin (PRAVACHOL) 40 MG tablet Take 40 mg by mouth every Monday.      Allergies:   Ciprofloxacin, Oxycodone-acetaminophen, Tyloxapol, Ace inhibitors, Metronidazole, Morphine and related, Oxycodone-acetaminophen, Septra [sulfamethoxazole-trimethoprim], Sulfa antibiotics, and Valium [diazepam]   Social History   Socioeconomic History  . Marital status: Widowed    Spouse name: Not on file  . Number of children: 2  . Years of education: Not on file  . Highest education level: Not on file  Occupational History  . Occupation: Retired   Tobacco Use  . Smoking status: Never Smoker  . Smokeless tobacco: Never Used  Substance and Sexual Activity  . Alcohol use: No  . Drug use: No  . Sexual activity: Not on file  Other Topics Concern  . Not on file  Social History Narrative   1-2 caffeine drinks daily    Social Determinants of Health   Financial Resource Strain:   . Difficulty of Paying Living Expenses: Not on file  Food Insecurity:   . Worried About Charity fundraiser in the Last Year: Not on file  . Ran Out of Food in the Last Year: Not on file  Transportation Needs:   . Lack of Transportation (Medical): Not on file  . Lack of Transportation (Non-Medical): Not on file  Physical Activity:   . Days of Exercise per Week: Not on file  .  Minutes of Exercise per Session: Not on file  Stress:   . Feeling of Stress : Not on file  Social Connections:   . Frequency of Communication with Friends and Family: Not on file  . Frequency of Social Gatherings with Friends and Family: Not on file  . Attends Religious Services: Not on file  . Active Member of Clubs or Organizations: Not on file  . Attends Archivist Meetings: Not on file  . Marital Status: Not on file     Family History: The patient's family history includes Arthritis in an other family member; Brain cancer in her brother and sister; Colon  polyps in her maternal aunt and sister; Diabetes in her mother; Lung cancer in her sister; Ovarian cancer in her sister; Prostate cancer in her father; Renal Disease in her sister. There is no history of Colon cancer.  ROS:   Please see the history of present illness.    All other systems reviewed and are negative.  EKGs/Labs/Other Studies Reviewed:    The following studies were reviewed today:   EKG:  EKG is ordered today.  The ekg ordered today demonstrates sinus rhythm, rate 66, motion artifact, no ST/T abnormalities  Recent Labs: 04/10/2019: ALT 30; BUN 24; Creatinine, Ser 0.85; Hemoglobin 13.1; Platelets 279; Potassium 4.2; Sodium 142  Recent Lipid Panel No results found for: CHOL, TRIG, HDL, CHOLHDL, VLDL, LDLCALC, LDLDIRECT   ABIs 08/29/14: 1. Mild bilateral lower extremity arterial occlusive disease at rest. Should the patient fail conservative treatment, consider post exercise ABIs, CTA runoff (higher spatial resolution) or MRA runoff (no radiation risk, can be performed noncontrast in the setting of renal dysfunction) to better define the site and nature of arterial occlusive disease and delineate treatment options.  TTE 08/15/19: 1. Left ventricular ejection fraction, by visual estimation, is 60 to  65%. The left ventricle has normal function. Left ventricular septal wall  thickness was moderately increased. There is no left ventricular  hypertrophy.  2. Global right ventricle has normal systolic function.The right  ventricular size is normal. No increase in right ventricular wall  thickness.  3. Left atrial size was normal.  4. Right atrial size was normal.  5. The mitral valve is normal in structure. No evidence of mitral valve  regurgitation. No evidence of mitral stenosis.  6. The tricuspid valve is normal in structure. Tricuspid valve  regurgitation is not demonstrated.  7. The aortic valve is tricuspid. Aortic valve regurgitation is trivial.  Mild aortic  valve sclerosis without stenosis.  8. The pulmonic valve was normal in structure. Pulmonic valve  regurgitation is not visualized.  9. The inferior vena cava is normal in size with greater than 50%  respiratory variability, suggesting right atrial pressure of 3 mmHg.   Cardiac monitor 10/11/19:  No significant abnormalities.   Predominant rhythm is sinus rhythm. Range is 55-108 bpm bpm with average of 73 bpm. No atrial fibrillation, sustained ventricular tachycardia, significant pause, or high degree AV block. Total ectopy <1%. 9 patient triggered events, corresponding to sinus rhythm +/- PACs/PVCs. No significant abnormalities.   Physical Exam:    VS:  BP 140/74   Pulse 88   Ht 5\' 4"  (1.626 m)   Wt 223 lb 6.4 oz (101.3 kg)   SpO2 98%   BMI 38.35 kg/m     Wt Readings from Last 3 Encounters:  11/12/19 223 lb 6.4 oz (101.3 kg)  08/30/19 219 lb (99.3 kg)  08/01/19 219 lb 6.4 oz (99.5 kg)  GEN: Well nourished, well developed in no acute distress HEENT: Normal NECK: No JVD LYMPHATICS: No lymphadenopathy CARDIAC:RRR, no murmurs, rubs, gallops RESPIRATORY:  Clear to auscultation without rales, wheezing or rhonchi  ABDOMEN: Soft, non-tender, non-distended MUSCULOSKELETAL:  No edema; No deformity  SKIN: Warm and dry NEUROLOGIC:  Alert and oriented x 3 PSYCHIATRIC:  Normal affect   ASSESSMENT:    1. Palpitations   2. Essential hypertension   3. Hyperlipidemia, unspecified hyperlipidemia type   4. Claudication of both lower extremities (HCC)    PLAN:    In order of problems listed above:  Palpitations: 30-day monitor showed no significant abnormalities.  TTE was done on 08/15/2019, which showed normal LV systolic function, moderate LVH, normal RV function, no significant valvular disease.  Hypertension: On metoprolol 100 mg daily and olmesartan 20 mg daily.  Appears contolled.   Hyperlipidemia: On pravastatin 40 mg daily 1 day/week.  10-year ASCVD risk score is 50%.   Recommend higher intensity statin.  She is agreeable to trying rosuvastatin 5 mg daily, will titrate up as tolerated  Leg pain: will check ABIs to evaluate for PAD as cause of symptoms  RTC in 3 months   Medication Adjustments/Labs and Tests Ordered: Current medicines are reviewed at length with the patient today.  Concerns regarding medicines are outlined above.  Orders Placed This Encounter  Procedures  . VAS [place] LOWER EXTREMITY ARTERIAL DUPLEX  . VAS [place] ABI WITH/WO TBI   Meds ordered this encounter  Medications  . rosuvastatin (CRESTOR) 5 MG tablet    Sig: Take 1 tablet (5 mg total) by mouth daily. X 2 weeks. If tolerating okay, increase to 10 mg daily    Dispense:  30 tablet    Refill:  0    Patient Instructions  Medication Instructions:  STOP pravastatin START rosuvastatin (Crestor) 5 mg daily x 2 weeks --if you are tolerating okay after 2 weeks, increase to 10 mg daily  *If you need a refill on your cardiac medications before your next appointment, please call your pharmacy*  Lab Work: NONE  Testing/Procedures: Your physician has requested that you have an ankle brachial index (ABI). During this test an ultrasound and blood pressure cuff are used to evaluate the arteries that supply the arms and legs with blood. Allow thirty minutes for this exam. There are no restrictions or special instructions.  Follow-Up: At [facility], you and your health needs are our priority.  As part of our continuing mission to provide you with exceptional heart care, we have created designated Provider Care Teams.  These Care Teams include your primary Cardiologist (physician) and Advanced Practice Providers (APPs -  Physician Assistants and Nurse Practitioners) who all work together to provide you with the care you need, when you need it.  Your next appointment:   3 month(s)  The format for your next appointment:   In Person  Provider:   Oswaldo Milian, MD     Signed,  Donato Heinz, MD  11/12/2019 5:33 PM    [place]

## 2019-11-12 ENCOUNTER — Other Ambulatory Visit: Payer: Self-pay

## 2019-11-12 ENCOUNTER — Encounter: Payer: Self-pay | Admitting: Cardiology

## 2019-11-12 ENCOUNTER — Ambulatory Visit: Payer: Medicare Other | Admitting: Cardiology

## 2019-11-12 VITALS — BP 140/74 | HR 88 | Ht 64.0 in | Wt 223.4 lb

## 2019-11-12 DIAGNOSIS — R002 Palpitations: Secondary | ICD-10-CM | POA: Diagnosis not present

## 2019-11-12 DIAGNOSIS — E785 Hyperlipidemia, unspecified: Secondary | ICD-10-CM | POA: Diagnosis not present

## 2019-11-12 DIAGNOSIS — I10 Essential (primary) hypertension: Secondary | ICD-10-CM

## 2019-11-12 DIAGNOSIS — I739 Peripheral vascular disease, unspecified: Secondary | ICD-10-CM | POA: Diagnosis not present

## 2019-11-12 MED ORDER — ROSUVASTATIN CALCIUM 5 MG PO TABS: 5.0000 mg | ORAL_TABLET | Freq: Every day | ORAL | 0 refills | Status: DC

## 2019-11-12 NOTE — Patient Instructions (Signed)
Medication Instructions:  STOP pravastatin START rosuvastatin (Crestor) 5 mg daily x 2 weeks --if you are tolerating okay after 2 weeks, increase to 10 mg daily  *If you need a refill on your cardiac medications before your next appointment, please call your pharmacy*  Lab Work: NONE  Testing/Procedures: Your physician has requested that you have an ankle brachial index (ABI). During this test an ultrasound and blood pressure cuff are used to evaluate the arteries that supply the arms and legs with blood. Allow thirty minutes for this exam. There are no restrictions or special instructions.  Follow-Up: At [facility], you and your health needs are our priority.  As part of our continuing mission to provide you with exceptional heart care, we have created designated Provider Care Teams.  These Care Teams include your primary Cardiologist (physician) and Advanced Practice Providers (APPs -  Physician Assistants and Nurse Practitioners) who all work together to provide you with the care you need, when you need it.  Your next appointment:   3 month(s)  The format for your next appointment:   In Person  Provider:   Oswaldo Milian, MD

## 2019-11-29 ENCOUNTER — Telehealth: Payer: Self-pay | Admitting: *Deleted

## 2019-11-29 ENCOUNTER — Other Ambulatory Visit: Payer: Self-pay

## 2019-11-29 ENCOUNTER — Ambulatory Visit (HOSPITAL_COMMUNITY)
Admission: RE | Admit: 2019-11-29 | Discharge: 2019-11-29 | Disposition: A | Discharge status: Home / Self Care | Payer: Medicare Other | Source: Ambulatory Visit | Attending: Cardiology | Admitting: Cardiology

## 2019-11-29 DIAGNOSIS — I739 Peripheral vascular disease, unspecified: Secondary | ICD-10-CM | POA: Diagnosis present

## 2019-11-29 NOTE — Telephone Encounter (Signed)
Patient in office for vascular US.   Wanted to let Dr. Gardiner Rhyme know she is unable to tolerate the rosuvastatin that was started, due to leg aching.    She stopped this medication and restarted pravastatin once weekly as she was taking prior.   Med list updated  Will make Dr. Gardiner Rhyme aware.

## 2019-12-03 ENCOUNTER — Encounter: Payer: Self-pay | Admitting: *Deleted

## 2020-02-11 ENCOUNTER — Ambulatory Visit: Payer: Medicare Other | Admitting: Cardiology

## 2020-03-26 ENCOUNTER — Other Ambulatory Visit: Payer: Self-pay | Admitting: Orthopedic Surgery

## 2020-03-26 DIAGNOSIS — M25562 Pain in left knee: Secondary | ICD-10-CM

## 2020-04-03 ENCOUNTER — Other Ambulatory Visit: Payer: Self-pay

## 2020-04-03 ENCOUNTER — Encounter: Payer: Self-pay | Admitting: *Deleted

## 2020-04-03 ENCOUNTER — Other Ambulatory Visit: Payer: Self-pay | Admitting: *Deleted

## 2020-04-03 ENCOUNTER — Ambulatory Visit
Admission: RE | Admit: 2020-04-03 | Discharge: 2020-04-03 | Disposition: A | Discharge status: Home / Self Care | Payer: Medicare Other | Source: Ambulatory Visit | Attending: Orthopedic Surgery | Admitting: Orthopedic Surgery

## 2020-04-03 DIAGNOSIS — C220 Liver cell carcinoma: Secondary | ICD-10-CM

## 2020-04-03 DIAGNOSIS — M25562 Pain in left knee: Secondary | ICD-10-CM

## 2020-04-03 HISTORY — PX: IR RADIOLOGIST EVAL & MGMT: IMG5224

## 2020-04-03 NOTE — Consult Note (Signed)
Chief Complaint: Bilateral knee pain  Referring Physician(s): Graves,Braylan Faul  History of Present Illness: THETA LEAF is a 79 y.o. female with past medical history significant for breast cancer, hypertension and hyperlipidemia who is seen today via telemedicine consultation for evaluation of bilateral knee pain.  Patient was initially offered bilateral total knee replacements by Dr. Alvan Dame approximately 2 years ago however ultimately decided not to go through this procedure given her fear of undergoing surgery.  Since that time, the patient has been evaluated Dr. Berenice Primas and again been offered total knee replacement however again is scared to undergo knee surgery.  Patient has undergone previous steroid and Synvisc injections of both knees.  She states that previously the injections worked fairly well however their efficacy waned with time.  Patient has not undergone a knee injection for the past 2 years.  Patient currently rates her right knee pain slightly worse than her left.  She states that her right knee pain is approximately 7 / 10 out of rest and worsens to 10 / 10 with activities.  Patient lives at home and ambulates both with a cane and a walker.  She is independent with all activities of daily living though states she has difficulty with stairs.     Past Medical History:  Diagnosis Date  . Anxiety                                                                                                                                                                                                                                                                  . Arthritis   . Asthma   . Breast cancer ([place]) 2015  . Complication of anesthesia    shaky-hard to wake up  . Dental bridge present   . Depression   . Diabetes ([place])   . Fatty liver   . GERD (gastroesophageal reflux disease)   . Hemorrhoids   . Hot flashes   . HTN (hypertension)   . Hyperlipemia   . Hypertension    . Kidney stones   . Neuropathy   . Obesity   . Obstructive sleep apnea   . Osteoarthritis bilteral knees  . Personal history of radiation therapy 2015  . Phobia   . Plantar fasciitis   .  Radiation 05/14/14   44 Gy Left breast  . Wears glasses    reading    Past Surgical History:  Procedure Laterality Date  . BREAST LUMPECTOMY  1985   left-neg  . BREAST LUMPECTOMY Left 03/11/2014   left  . COLONOSCOPY  03/11/2011   diverticulosis, internal hemorrhoids  . CYSTOSCOPY W/ URETERAL STENT PLACEMENT     removed  . DILATION AND CURETTAGE OF UTERUS    . KNEE SURGERY Left    x 2-left  . OOPHORECTOMY Bilateral   . VAGINAL HYSTERECTOMY  1988   endometriosis, fibroids    Allergies: Ciprofloxacin, Oxycodone-acetaminophen, Tyloxapol, Ace inhibitors, Metronidazole, Morphine and related, Oxycodone-acetaminophen, Septra [sulfamethoxazole-trimethoprim], Sulfa antibiotics, and Valium [diazepam]  Medications: Prior to Admission medications   Medication Sig Start Date End Date Taking? Authorizing Provider  ALPRAZolam Duanne Moron) 0.5 MG tablet Take 0.5 mg by mouth 3 (three) times daily as needed for anxiety. 1/2 as needed    [provider]  cholecalciferol (VITAMIN D) 1000 units tablet Take 1,000 Units by mouth daily.    [provider]  DULoxetine (CYMBALTA) 60 MG capsule Take 60 mg by mouth daily.    [provider]  ketoconazole (NIZORAL) 2 % cream Apply 1 application topically daily. 03/22/17   Magrinat, Virgie Dad, MD  metoprolol (TOPROL-XL) 100 MG 24 hr tablet Take 100 mg by mouth daily.      [provider]  olmesartan (BENICAR) 20 MG tablet Take 20 mg by mouth daily.    [provider]  OMEGA-3 1000 MG CAPS Take 1 capsule by mouth daily.      [provider]  omeprazole (PRILOSEC OTC) 20 MG tablet Take 20 mg by mouth daily.      [provider]  pravastatin (PRAVACHOL) 40 MG tablet Take 40 mg by mouth once a week.    [provider]  sitaGLIPtin (JANUVIA) 100 MG tablet Take 100 mg by mouth daily.    [provider]     Family History  Problem Relation Age of Onset  . Diabetes Mother   . Colon polyps Sister   . Lung cancer Sister   . Ovarian cancer Sister   . Brain cancer Sister   . Prostate cancer Father   . Brain cancer Brother   . Colon polyps Maternal Aunt   . Renal Disease Sister   . Arthritis Other        Neice  . Colon cancer Neg Hx     Social History   Socioeconomic History  . Marital status: Widowed    Spouse name: Not on file  . Number of children: 2  . Years of education: Not on file  . Highest education level: Not on file  Occupational History  . Occupation: Retired   Tobacco Use  . Smoking status: Never Smoker  . Smokeless tobacco: Never Used  Substance and Sexual Activity  . Alcohol use: No  . Drug use: No  . Sexual activity: Not on file  Other Topics Concern  . Not on file  Social History Narrative   1-2 caffeine drinks daily    Social Determinants of Health   Financial Resource Strain:   . Difficulty of Paying Living Expenses:   Food Insecurity:   . Worried About Charity fundraiser in the Last Year:   . Arboriculturist in the Last Year:   Transportation Needs:   . Film/video editor (Medical):   [name] Kitchen Lack of Transportation (Non-Medical):  Physical Activity:   . Days of Exercise per Week:   . Minutes of Exercise per Session:   Stress:   . Feeling of Stress :   Social Connections:   . Frequency of Communication with Friends and Family:   . Frequency of Social Gatherings with Friends and Family:   . Attends Religious Services:   . Active Member of Clubs or Organizations:   . Attends Archivist Meetings:   [name] Kitchen Marital Status:     ECOG Status: 2 - Symptomatic, <50% confined to bed  Review of Systems  Review of Systems: A 12 point ROS discussed and pertinent positives are indicated in the HPI above.  All other systems are  negative.  Physical Exam No direct physical exam was performed (except for noted visual exam findings with Video Visits).   Vital Signs: There were no vitals taken for this visit.  Imaging: No results found.  Labs:  CBC: Recent Labs    04/10/19 1315  WBC 11.0*  HGB 13.1  HCT 40.6  PLT 279    COAGS: No results for input(s): INR, APTT in the last 8760 hours.  BMP: Recent Labs    04/10/19 1315  NA 142  K 4.2  CL 105  CO2 27  GLUCOSE 77  BUN 24*  CALCIUM 9.5  CREATININE 0.85  GFRNONAA >60  GFRAA >60    LIVER FUNCTION TESTS: Recent Labs    04/10/19 1315  BILITOT 0.3  AST 30  ALT 30  ALKPHOS 81  PROT 7.6  ALBUMIN 3.7    TUMOR MARKERS: No results for input(s): AFPTM, CEA, CA199, CHROMGRNA in the last 8760 hours.  Assessment and Plan:  JANAYSHA DEPAULO is a 79 y.o. female with past medical history significant for breast cancer, hypertension and hyperlipidemia who is seen today via telemedicine consultation for evaluation of bilateral knee pain.  Patient currently rates her right knee pain slightly worse than her left.  She states that her right knee pain is approximately 7 / 10 out of rest and worsens to 10 / 10 with activities.  I explained that the [place] treatment is a new modality that utilizes cyroneurolysis technology to temporarily alter the pain recepting nerves supplying the anterior aspect of the knee, in hopes of achieving a clinically significant reduction in knee pain.    I explained that the [place] therapy is performed on one knee at a time and as her right knee pain is subjectively worse, we initially would pursue right-sided IOVERA.   I explained that if successful, the patient will experience a rapid pain reduction which can last for approximately 3 months.  I explained that while their knee pain may be reduced, the structural damage of the knee remains, and thus, this is not a curative technology and their knee pain will return.      Risks associated with the procedure include bleeding/bruising, infection and post procedural paresthesias/weakness.   I explained that if she had a desirable result with her right knee we could pursue left-sided IOVERA in the future as she desires.  The procedure is performed as an outpatient basis at [facility]  The procedure is performed soley with local anesthesia, and therefore the patient does need to be NPO, there is no postprocedural recovery and the patient does not need a driver home.  Additionally, the patient does not have to hold any other their medications, including anticoagulation.  On the day of the procedure, the patient is encouraged to wear  either shorts or loose fitting pants that can be rolled up to the upper thigh.     Following this prolonged and detailed conversation, the patient wishes to pursue IOVERA therapy of the right knee.  Additionally, she is hopeful that if she has a desirable result she potentially could undergo the treatment prior to a definitive total knee replacement in the future.  As such, pending insurance approval, this procedure would be scheduled at [facility] hospital at the next earliest convenience.  The patient knows to call the interventional radiology clinic with any interval questions or concerns   Thank you for this interesting consult.  I greatly enjoyed meeting MARKEYA MINCY and look forward to participating in their care.  A copy of this report was sent to the requesting provider on this date.  Electronically Signed: Sandi Mariscal 04/03/2020, 10:10 AM   I spent a total of 30 Minutes in remote  clinical consultation, greater than 50% of which was counseling/coordinating care for bilateral knee pain, right greater than left.    Visit type: Audio only (telephone). Audio (no video) only due to patient's lack of internet/smartphone capability. Alternative for in-person consultation at [facility], [address] [place],  [place], [place]. This visit type was conducted due to national recommendations for restrictions regarding the COVID-19 Pandemic (e.g. social distancing).  This format is felt to be most appropriate for this patient at this time.  All issues noted in this document were discussed and addressed.

## 2020-04-11 ENCOUNTER — Other Ambulatory Visit (HOSPITAL_COMMUNITY): Payer: Self-pay | Admitting: Interventional Radiology

## 2020-04-11 DIAGNOSIS — G8929 Other chronic pain: Secondary | ICD-10-CM

## 2020-04-17 ENCOUNTER — Other Ambulatory Visit: Payer: Self-pay

## 2020-04-17 ENCOUNTER — Ambulatory Visit (HOSPITAL_COMMUNITY)
Admission: RE | Admit: 2020-04-17 | Discharge: 2020-04-17 | Disposition: A | Discharge status: Home / Self Care | Payer: Medicare Other | Source: Ambulatory Visit | Attending: Interventional Radiology | Admitting: Interventional Radiology

## 2020-04-17 DIAGNOSIS — M25561 Pain in right knee: Secondary | ICD-10-CM | POA: Insufficient documentation

## 2020-04-17 DIAGNOSIS — G8929 Other chronic pain: Secondary | ICD-10-CM | POA: Diagnosis not present

## 2020-04-17 HISTORY — PX: IR ABLATE LIVER CRYOABLATION: IMG5524

## 2020-04-17 MED ORDER — LIDOCAINE HCL (PF) 1 % IJ SOLN
INTRAMUSCULAR | Status: AC
  Filled 2020-04-17: qty 30

## 2020-04-17 NOTE — Procedures (Signed)
Pre procedural Dx: Right sided knee pain Post procedural Dx: Same  Technically successful cryoneurolysis (IOVERA) of the IAFCN, MAFCN, and the ISN for right sided knee pain with clinically significant reduction in postprocedural pain scale.  EBL: Trace Complications: None immediate  Pre procedural WOMAC score: 66/96   Pre procedural knee pain: At rest: 4/10 With activity: 7/10  Post procedural knee pain: At rest: 0/10 With activity: 1/10   Ronny Bacon, MD Pager #: [phone]

## 2020-04-28 ENCOUNTER — Other Ambulatory Visit: Payer: Self-pay | Admitting: Interventional Radiology

## 2020-04-28 DIAGNOSIS — M25562 Pain in left knee: Secondary | ICD-10-CM

## 2020-04-28 DIAGNOSIS — M25561 Pain in right knee: Secondary | ICD-10-CM

## 2020-04-29 ENCOUNTER — Other Ambulatory Visit: Payer: Self-pay | Admitting: Family Medicine

## 2020-04-29 DIAGNOSIS — Z1231 Encounter for screening mammogram for malignant neoplasm of breast: Secondary | ICD-10-CM

## 2020-05-21 ENCOUNTER — Ambulatory Visit
Admission: RE | Admit: 2020-05-21 | Discharge: 2020-05-21 | Disposition: A | Discharge status: Home / Self Care | Payer: Medicare Other | Source: Ambulatory Visit | Attending: Family Medicine | Admitting: Family Medicine

## 2020-05-21 ENCOUNTER — Other Ambulatory Visit: Payer: Self-pay

## 2020-05-21 DIAGNOSIS — Z1231 Encounter for screening mammogram for malignant neoplasm of breast: Secondary | ICD-10-CM

## 2020-05-22 ENCOUNTER — Ambulatory Visit
Admission: RE | Admit: 2020-05-22 | Discharge: 2020-05-22 | Disposition: A | Discharge status: Home / Self Care | Payer: Medicare Other | Source: Ambulatory Visit | Attending: Interventional Radiology | Admitting: Interventional Radiology

## 2020-05-22 DIAGNOSIS — M25562 Pain in left knee: Secondary | ICD-10-CM

## 2020-05-22 DIAGNOSIS — M25561 Pain in right knee: Secondary | ICD-10-CM

## 2020-05-22 HISTORY — PX: IR RADIOLOGIST EVAL & MGMT: IMG5224

## 2020-05-22 NOTE — Progress Notes (Signed)
Patient ID: Candice Reid, female   DOB: 1940-11-20, 79 y.o.   MRN: [id number]         Chief Complaint: Bilateral knee pain, post IOVERA treatment of the right knee on 04/17/20  Referring Physician(s): Graves  History of Present Illness:  Candice Reid is a 79 y.o. female with past medical history significant for breast cancer, hypertension and hyperlipidemia who is seen today via telemedicine consultation following IOVERA treatment of the right knee on 04/17/2020.  In brief review, the patient was initially offered bilateral total knee replacements by Dr. Alvan Dame approximately 2 years ago however ultimately decided not to go through this procedure given her fear of undergoing surgery.  Since that time, the patient has been evaluated Dr. Berenice Primas and again been offered total knee replacement however again is scared to undergo knee surgery.  Patient has undergone previous steroid and Synvisc injections of both knees.  She states that previously the injections worked fairly well however their efficacy waned with time.  Patient has not undergone a knee injection for the past 2 years.  Patient currently rates her right knee pain slightly worse than her left.  She states that her right knee pain is approximately 7 / 10 out of rest and worsens to 10 / 10 with activities.  Patient lives at home and ambulates both with a cane and a walker.  She is independent with all activities of daily living though states she has difficulty with stairs. _________________________________________________________________  For her right sided knee pain, she underwent IOVERA therapy on 7/22 resulting in a clinically significant reduction in her knee pain as follows:  Preprocedural knee pain:  At rest: 4/10 With activity: 7/10.  Immediate postprocedural knee pain: At rest: 0/10 With activity: 1/10  Current knee pain: At rest: 0/10 With activity: 2/10  She reports expected postprocedural numbness  involving the right knee but is otherwise very pleased with the outcome of the IOVERA treatment, so much so that she is now considering undergoing a knee replacement and if so, would desire I reviewed her treatment prior to the knee replacement.  She currently rates her left knee pain as tolerable and is currently not interested in undergoing their therapy of the left knee.   Past Medical History:  Diagnosis Date  . Anxiety                                                                                                                                                                                                                                                                  .  Arthritis   . Asthma   . Breast cancer ([place]) 2015  . Complication of anesthesia    shaky-hard to wake up  . Dental bridge present   . Depression   . Diabetes ([place])   . Fatty liver   . GERD (gastroesophageal reflux disease)   . Hemorrhoids   . Hot flashes   . HTN (hypertension)   . Hyperlipemia   . Hypertension   . Kidney stones   . Neuropathy   . Obesity   . Obstructive sleep apnea   . Osteoarthritis bilteral knees  . Personal history of radiation therapy 2015  . Phobia   . Plantar fasciitis   . Radiation 05/14/14   44 Gy Left breast  . Wears glasses    reading    Past Surgical History:  Procedure Laterality Date  . BREAST LUMPECTOMY  1985   left-neg  . BREAST LUMPECTOMY Left 03/11/2014   left  . COLONOSCOPY  03/11/2011   diverticulosis, internal hemorrhoids  . CYSTOSCOPY W/ URETERAL STENT PLACEMENT     removed  . DILATION AND CURETTAGE OF UTERUS    . IR ABLATE LIVER CRYOABLATION  04/17/2020  . IR RADIOLOGIST EVAL & MGMT  04/03/2020  . KNEE SURGERY Left    x 2-left  . OOPHORECTOMY Bilateral   . VAGINAL HYSTERECTOMY  1988   endometriosis, fibroids    Allergies: Ciprofloxacin, Oxycodone-acetaminophen, Tyloxapol, Ace inhibitors, Metronidazole, Morphine and related, Oxycodone-acetaminophen,  Septra [sulfamethoxazole-trimethoprim], Sulfa antibiotics, and Valium [diazepam]  Medications: Prior to Admission medications   Medication Sig Start Date End Date Taking? Authorizing Provider  ALPRAZolam Duanne Moron) 0.5 MG tablet Take 0.5 mg by mouth 3 (three) times daily as needed for anxiety. 1/2 as needed    [provider]  cholecalciferol (VITAMIN D) 1000 units tablet Take 1,000 Units by mouth daily.    [provider]  DULoxetine (CYMBALTA) 60 MG capsule Take 60 mg by mouth daily.    [provider]  ketoconazole (NIZORAL) 2 % cream Apply 1 application topically daily. 03/22/17   Magrinat, Virgie Dad, MD  metoprolol (TOPROL-XL) 100 MG 24 hr tablet Take 100 mg by mouth daily.      [provider]  olmesartan (BENICAR) 20 MG tablet Take 20 mg by mouth daily.    [provider]  OMEGA-3 1000 MG CAPS Take 1 capsule by mouth daily.      [provider]  omeprazole (PRILOSEC OTC) 20 MG tablet Take 20 mg by mouth daily.      [provider]  pravastatin (PRAVACHOL) 40 MG tablet Take 40 mg by mouth once a week.    [provider]  sitaGLIPtin (JANUVIA) 100 MG tablet Take 100 mg by mouth daily.    [provider]     Family History  Problem Relation Age of Onset  . Diabetes Mother   . Colon polyps Sister   . Lung cancer Sister   . Ovarian cancer Sister   . Brain cancer Sister   . Prostate cancer Father   . Brain cancer Brother   . Colon polyps Maternal Aunt   . Renal Disease Sister   . Arthritis Other        Neice  . Colon cancer Neg Hx     Social History   Socioeconomic History  . Marital status: Widowed    Spouse name: Not on file  . Number of children: 2  . Years of education: Not on file  . Highest education level: Not  on file  Occupational History  . Occupation: Retired   Tobacco Use  . Smoking status: Never Smoker  . Smokeless tobacco: Never Used  Substance and Sexual Activity  . Alcohol use:  No  . Drug use: No  . Sexual activity: Not on file  Other Topics Concern  . Not on file  Social History Narrative   1-2 caffeine drinks daily    Social Determinants of Health   Financial Resource Strain:   . Difficulty of Paying Living Expenses: Not on file  Food Insecurity:   . Worried About Charity fundraiser in the Last Year: Not on file  . Ran Out of Food in the Last Year: Not on file  Transportation Needs:   . Lack of Transportation (Medical): Not on file  . Lack of Transportation (Non-Medical): Not on file  Physical Activity:   . Days of Exercise per Week: Not on file  . Minutes of Exercise per Session: Not on file  Stress:   . Feeling of Stress : Not on file  Social Connections:   . Frequency of Communication with Friends and Family: Not on file  . Frequency of Social Gatherings with Friends and Family: Not on file  . Attends Religious Services: Not on file  . Active Member of Clubs or Organizations: Not on file  . Attends Archivist Meetings: Not on file  . Marital Status: Not on file    ECOG Status: 1 - Symptomatic but completely ambulatory  Review of Systems  Review of Systems: A 12 point ROS discussed and pertinent positives are indicated in the HPI above.  All other systems are negative.  Physical Exam No direct physical exam was performed (except for noted visual exam findings with Video Visits).   Vital Signs: There were no vitals taken for this visit.  Imaging: No results found.  Labs:  CBC: No results for input(s): WBC, HGB, HCT, PLT in the last 8760 hours.  COAGS: No results for input(s): INR, APTT in the last 8760 hours.  BMP: No results for input(s): NA, K, CL, CO2, GLUCOSE, BUN, CALCIUM, CREATININE, GFRNONAA, GFRAA in the last 8760 hours.  Invalid input(s): CMP  LIVER FUNCTION TESTS: No results for input(s): BILITOT, AST, ALT, ALKPHOS, PROT, ALBUMIN in the last 8760 hours.  TUMOR MARKERS: No results for input(s): AFPTM,  CEA, CA199, CHROMGRNA in the last 8760 hours.  Assessment and Plan:  KORINNE GREENSTEIN is a 79 y.o. female with past medical history significant for breast cancer, hypertension and hyperlipidemia who is seen today via telemedicine consultation following IOVERA treatment of the right knee on 04/17/2020 with clinically significant reduction in knee pain as follows:  Preprocedural knee pain: At rest: 4/10 With activity: 7/10.  Immediate postprocedural knee pain: At rest: 0/10 With activity: 1/10  Current knee pain: At rest: 0/10 With activity: 2/10  She reports expected postprocedural numbness involving the right knee but is otherwise very pleased with the outcome of the IOVERA treatment, so much so that she is now considering undergoing a knee replacement and if so, would desire I reviewed her treatment prior to the knee replacement.  She currently rates her left knee pain as tolerable and is currently not interested in undergoing their therapy of the left knee.  PLAN: - If patient ultimately decides to undergo a right total knee replacement, we may coordinate preprocedural IOVERA therapy 2 weeks prior to the operative date at the discretion of Dr. Berenice Primas - The patient knows to call  the interventional radiology clinic if she desires to either undergo treatment of her left knee or repeat treatment of her right knee, though repeat IOVERA of the right knee is typically performed only on a q 6 months basis (January 2022).  A copy of this report was sent to the requesting provider on this date.  Electronically Signed: Sandi Mariscal 05/22/2020, 8:33 AM   I spent a total of 15 Minutes in remote  clinical consultation, greater than 50% of which was counseling/coordinating care for post IOVERA of the right knee.    Visit type: Audio only (telephone). Audio (no video) only due to patient's lack of internet/smartphone capability. Alternative for in-person consultation at [facility], [place], [place], [place]. This visit type was conducted due to national recommendations for restrictions regarding the COVID-19 Pandemic (e.g. social distancing).  This format is felt to be most appropriate for this patient at this time.  All issues noted in this document were discussed and addressed.

## 2020-08-28 ENCOUNTER — Other Ambulatory Visit: Payer: Self-pay | Admitting: Orthopedic Surgery

## 2020-09-29 ENCOUNTER — Other Ambulatory Visit: Payer: Self-pay | Admitting: Orthopedic Surgery

## 2020-09-29 DIAGNOSIS — Z01811 Encounter for preprocedural respiratory examination: Secondary | ICD-10-CM

## 2020-10-02 NOTE — Patient Instructions (Addendum)
DUE TO COVID-19 ONLY ONE VISITOR IS ALLOWED TO COME WITH YOU AND STAY IN THE WAITING ROOM ONLY DURING PRE OP AND PROCEDURE.   IF YOU WILL BE ADMITTED INTO THE HOSPITAL YOU ARE ALLOWED ONE SUPPORT PERSON DURING VISITATION HOURS ONLY (10AM -8PM)   . The support person may change daily. . The support person must pass our screening, gel in and out, and wear a mask at all times, including in the patient's room. . Patients must also wear a mask when staff or their support person are in the room.   COVID SWAB TESTING MUST BE COMPLETED ON:  Tuesday, 10-14-20 @ 11:00 AM   [address] [place], [place] [address]  (Must self quarantine after testing. Follow instructions on handout.)   Your procedure is scheduled on:  Friday, 10-17-20   Report to [facility] Main  Entrance    Report to Short Stay 5:30 AM   Call this number if you have problems the morning of surgery [phone]   Do not eat food :After Midnight.   May have liquids until 4:15 AM  day of surgery  CLEAR LIQUID DIET  Foods Allowed                                                                     Foods Excluded  Water, Black Coffee and tea, regular and decaf             liquids that you cannot  Plain Jell-O in any flavor  (No red)                                    see through such as: Fruit ices (not with fruit pulp)                                      milk, soups, orange juice              Iced Popsicles (No red)                                      All solid food                                   Apple juices Sports drinks like Gatorade (No red) Lightly seasoned clear broth or consume(fat free) Sugar, honey syrup     Complete one G2 drink the morning of surgery at  4:15 AM the day of surgery.   Oral Hygiene is also important to reduce your risk of infection.                                    Remember - BRUSH YOUR TEETH THE MORNING OF SURGERY WITH YOUR REGULAR TOOTHPASTE   Do NOT smoke after  Midnight   Take these medicines the morning of surgery with A SIP  OF WATER:  Cymbalta, Metoprolol, Omeprazole, Pravastatin  DO NOT TAKE ANY ORAL DIABETIC MEDICATIONS DAY OF YOUR SURGERY  How to Manage Your Diabetes Before and After Surgery  Why is it important to control my blood sugar before and after surgery? . Improving blood sugar levels before and after surgery helps healing and can limit problems. . A way of improving blood sugar control is eating a healthy diet by: o  Eating less sugar and carbohydrates o  Increasing activity/exercise o  Talking with your doctor about reaching your blood sugar goals . High blood sugars (greater than 180 mg/dL) can raise your risk of infections and slow your recovery, so you will need to focus on controlling your diabetes during the weeks before surgery. . Make sure that the doctor who takes care of your diabetes knows about your planned surgery including the date and location.  How do I manage my blood sugar before surgery? . Check your blood sugar at least 4 times a day, starting 2 days before surgery, to make sure that the level is not too high or low. o Check your blood sugar the morning of your surgery when you wake up and every 2 hours until you get to the Short Stay unit. . If your blood sugar is less than 70 mg/dL, you will need to treat for low blood sugar: o Do not take insulin. o Treat a low blood sugar (less than 70 mg/dL) with  cup of clear juice (cranberry or apple), 4 glucose tablets, OR glucose gel. o Recheck blood sugar in 15 minutes after treatment (to make sure it is greater than 70 mg/dL). If your blood sugar is not greater than 70 mg/dL on recheck, call [phone] for further instructions. . Report your blood sugar to the short stay nurse when you get to Short Stay.  . If you are admitted to the hospital after surgery: o Your blood sugar will be checked by the staff and you will probably be given insulin after surgery  (instead of oral diabetes medicines) to make sure you have good blood sugar levels. o The goal for blood sugar control after surgery is 80-180 mg/dL.   WHAT DO I DO ABOUT MY DIABETES MEDICATION?  [name] Kitchen Do not take oral diabetes medicines (pills) the morning of surgery.  . THE DAY BEFORE SURGERY:  Take Januvia as prescribed..       . THE MORNING OF SURGERY:  Do not take Januvia.   Reviewed and Endorsed by [facility] Patient Education Committee, August 2015                               You may not have any metal on your body including hair pins, jewelry, and body piercings             Do not wear make-up, lotions, powders, perfumes/cologne, or deodorant             Do not wear nail polish.  Do not shave  48 hours prior to surgery.           Do not bring valuables to the hospital. [place].   Contacts, dentures or bridgework may not be worn into surgery.   Bring CPAP mask and tubing day of surgery   Patients discharged the day of surgery will not be allowed to drive home.   Special Instructions: Bring a copy of  your healthcare power of attorney and living will documents         the day of surgery if you haven't scanned them in before.              Please read over the following fact sheets you were given: IF YOU HAVE QUESTIONS ABOUT YOUR PRE OP INSTRUCTIONS PLEASE CALL [phone]   [place] - Preparing for Surgery Before surgery, you can play an important role.  Because skin is not sterile, your skin needs to be as free of germs as possible.  You can reduce the number of germs on your skin by washing with CHG (chlorahexidine gluconate) soap before surgery.  CHG is an antiseptic cleaner which kills germs and bonds with the skin to continue killing germs even after washing. Please DO NOT use if you have an allergy to CHG or antibacterial soaps.  If your skin becomes reddened/irritated stop using the CHG and inform your nurse when you arrive at  Short Stay. Do not shave (including legs and underarms) for at least 48 hours prior to the first CHG shower.  You may shave your face/neck.  Please follow these instructions carefully:  1.  Shower with CHG Soap the night before surgery and the  morning of surgery.  2.  If you choose to wash your hair, wash your hair first as usual with your normal  shampoo.  3.  After you shampoo, rinse your hair and body thoroughly to remove the shampoo.                             4.  Use CHG as you would any other liquid soap.  You can apply chg directly to the skin and wash.  Gently with a scrungie or clean washcloth.  5.  Apply the CHG Soap to your body ONLY FROM THE NECK DOWN.   Do   not use on face/ open                           Wound or open sores. Avoid contact with eyes, ears mouth and   genitals (private parts).                       Wash face,  Genitals (private parts) with your normal soap.             6.  Wash thoroughly, paying special attention to the area where your    surgery  will be performed.  7.  Thoroughly rinse your body with warm water from the neck down.  8.  DO NOT shower/wash with your normal soap after using and rinsing off the CHG Soap.                9.  Pat yourself dry with a clean towel.            10.  Wear clean pajamas.            11.  Place clean sheets on your bed the night of your first shower and do not  sleep with pets. Day of Surgery : Do not apply any lotions/deodorants the morning of surgery.  Please wear clean clothes to the hospital/surgery center.  FAILURE TO FOLLOW THESE INSTRUCTIONS MAY RESULT IN THE CANCELLATION OF YOUR SURGERY  PATIENT SIGNATURE_________________________________  NURSE SIGNATURE__________________________________  ________________________________________________________________________   Adam Phenix  An incentive  spirometer is a tool that can help keep your lungs clear and active. This tool measures how well you are filling  your lungs with each breath. Taking long deep breaths may help reverse or decrease the chance of developing breathing (pulmonary) problems (especially infection) following:  A long period of time when you are unable to move or be active. BEFORE THE PROCEDURE   If the spirometer includes an indicator to show your best effort, your nurse or respiratory therapist will set it to a desired goal.  If possible, sit up straight or lean slightly forward. Try not to slouch.  Hold the incentive spirometer in an upright position. INSTRUCTIONS FOR USE  1. Sit on the edge of your bed if possible, or sit up as far as you can in bed or on a chair. 2. Hold the incentive spirometer in an upright position. 3. Breathe out normally. 4. Place the mouthpiece in your mouth and seal your lips tightly around it. 5. Breathe in slowly and as deeply as possible, raising the piston or the ball toward the top of the column. 6. Hold your breath for 3-5 seconds or for as long as possible. Allow the piston or ball to fall to the bottom of the column. 7. Remove the mouthpiece from your mouth and breathe out normally. 8. Rest for a few seconds and repeat Steps 1 through 7 at least 10 times every 1-2 hours when you are awake. Take your time and take a few normal breaths between deep breaths. 9. The spirometer may include an indicator to show your best effort. Use the indicator as a goal to work toward during each repetition. 10. After each set of 10 deep breaths, practice coughing to be sure your lungs are clear. If you have an incision (the cut made at the time of surgery), support your incision when coughing by placing a pillow or rolled up towels firmly against it. Once you are able to get out of bed, walk around indoors and cough well. You may stop using the incentive spirometer when instructed by your caregiver.  RISKS AND COMPLICATIONS  Take your time so you do not get dizzy or light-headed.  If you are in pain, you may  need to take or ask for pain medication before doing incentive spirometry. It is harder to take a deep breath if you are having pain. AFTER USE  Rest and breathe slowly and easily.  It can be helpful to keep track of a log of your progress. Your caregiver can provide you with a simple table to help with this. If you are using the spirometer at home, follow these instructions: [place] IF:   You are having difficultly using the spirometer.  You have trouble using the spirometer as often as instructed.  Your pain medication is not giving enough relief while using the spirometer.  You develop fever of 100.5 F (38.1 C) or higher. SEEK IMMEDIATE MEDICAL CARE IF:   You cough up bloody sputum that had not been present before.  You develop fever of 102 F (38.9 C) or greater.  You develop worsening pain at or near the incision site. MAKE SURE YOU:   Understand these instructions.  Will watch your condition.  Will get help right away if you are not doing well or get worse. Document Released: 01/24/2007 Document Revised: 12/06/2011 Document Reviewed: 03/27/2007 Exit[employer] Patient Information 2014 [employer], [place].   ________________________________________________________________________  WHAT IS A BLOOD TRANSFUSION? Blood Transfusion Information  A transfusion  is the replacement of blood or some of its parts. Blood is made up of multiple cells which provide different functions.  Red blood cells carry oxygen and are used for blood loss replacement.  White blood cells fight against infection.  Platelets control bleeding.  Plasma helps clot blood.  Other blood products are available for specialized needs, such as hemophilia or other clotting disorders. BEFORE THE TRANSFUSION  Who gives blood for transfusions?   Healthy volunteers who are fully evaluated to make sure their blood is safe. This is blood bank blood. Transfusion therapy is the safest it has ever been in  the practice of medicine. Before blood is taken from a donor, a complete history is taken to make sure that person has no history of diseases nor engages in risky social behavior (examples are intravenous drug use or sexual activity with multiple partners). The donor's travel history is screened to minimize risk of transmitting infections, such as malaria. The donated blood is tested for signs of infectious diseases, such as HIV and hepatitis. The blood is then tested to be sure it is compatible with you in order to minimize the chance of a transfusion reaction. If you or a relative donates blood, this is often done in anticipation of surgery and is not appropriate for emergency situations. It takes many days to process the donated blood. RISKS AND COMPLICATIONS Although transfusion therapy is very safe and saves many lives, the main dangers of transfusion include:   Getting an infectious disease.  Developing a transfusion reaction. This is an allergic reaction to something in the blood you were given. Every precaution is taken to prevent this. The decision to have a blood transfusion has been considered carefully by your caregiver before blood is given. Blood is not given unless the benefits outweigh the risks. AFTER THE TRANSFUSION  Right after receiving a blood transfusion, you will usually feel much better and more energetic. This is especially true if your red blood cells have gotten low (anemic). The transfusion raises the level of the red blood cells which carry oxygen, and this usually causes an energy increase.  The nurse administering the transfusion will monitor you carefully for complications. HOME CARE INSTRUCTIONS  No special instructions are needed after a transfusion. You may find your energy is better. Speak with your caregiver about any limitations on activity for underlying diseases you may have. SEEK MEDICAL CARE IF:   Your condition is not improving after your  transfusion.  You develop redness or irritation at the intravenous (IV) site. SEEK IMMEDIATE MEDICAL CARE IF:  Any of the following symptoms occur over the next 12 hours:  Shaking chills.  You have a temperature by mouth above 102 F (38.9 C), not controlled by medicine.  Chest, back, or muscle pain.  People around you feel you are not acting correctly or are confused.  Shortness of breath or difficulty breathing.  Dizziness and fainting.  You get a rash or develop hives.  You have a decrease in urine output.  Your urine turns a dark color or changes to pink, red, or brown. Any of the following symptoms occur over the next 10 days:  You have a temperature by mouth above 102 F (38.9 C), not controlled by medicine.  Shortness of breath.  Weakness after normal activity.  The white part of the eye turns yellow (jaundice).  You have a decrease in the amount of urine or are urinating less often.  Your urine turns a dark color or  changes to pink, red, or brown. Document Released: 09/10/2000 Document Revised: 12/06/2011 Document Reviewed: 04/29/2008 [facility] Patient Information 2014 [place], [place].  _______________________________________________________________________

## 2020-10-02 NOTE — Progress Notes (Addendum)
COVID Vaccine Completed:  x3 Date COVID Vaccine completed:  09-2020 Booster COVID vaccine manufacturer: Pfizer      PCP - Elias Else, MD Cardiologist - Epifanio Lesches, MD  Chest x-ray - 10-07-2020 in Epic EKG - 10-07-2020 in Epic Stress Test -  ECHO - 08-15-19 in Epic Cardiac Cath -  Pacemaker/ICD device last checked: Cardiac monitor - 10-02-19 in Epic   Sleep Study - a year ago, +sleep apnea CPAP -  Yes  Fasting Blood Sugar - 112 to 114 Checks Blood Sugar - once a month  Blood Thinner Instructions:  N/A Aspirin Instructions: Last Dose:  Anesthesia review: Hx of palpitations, followed by cardiology.  HTN, DM, OSA  Patient denies shortness of breath, fever, cough and chest pain at PAT appointment.  Pt able to slowly climb stairs due to knee pain.  Is able to perform housework, lives alone.   Patient verbalized understanding of instructions that were given to them at the PAT appointment. Patient was also instructed that they will need to review over the PAT instructions again at home before surgery.

## 2020-10-07 ENCOUNTER — Encounter (HOSPITAL_COMMUNITY): Payer: Self-pay

## 2020-10-07 ENCOUNTER — Other Ambulatory Visit: Payer: Self-pay

## 2020-10-07 ENCOUNTER — Encounter (HOSPITAL_COMMUNITY)
Admission: RE | Admit: 2020-10-07 | Discharge: 2020-10-07 | Disposition: A | Discharge status: Home / Self Care | Payer: Medicare Other | Source: Ambulatory Visit | Attending: Orthopedic Surgery | Admitting: Orthopedic Surgery

## 2020-10-07 ENCOUNTER — Ambulatory Visit (HOSPITAL_COMMUNITY)
Admission: RE | Admit: 2020-10-07 | Discharge: 2020-10-07 | Disposition: A | Discharge status: Home / Self Care | Payer: Medicare Other | Source: Ambulatory Visit | Attending: Orthopedic Surgery | Admitting: Orthopedic Surgery

## 2020-10-07 DIAGNOSIS — I700 Atherosclerosis of aorta: Secondary | ICD-10-CM | POA: Diagnosis not present

## 2020-10-07 DIAGNOSIS — Z01811 Encounter for preprocedural respiratory examination: Secondary | ICD-10-CM | POA: Insufficient documentation

## 2020-10-07 DIAGNOSIS — Z01818 Encounter for other preprocedural examination: Secondary | ICD-10-CM | POA: Diagnosis not present

## 2020-10-07 HISTORY — DX: Personal history of urinary calculi: Z87.442

## 2020-10-07 LAB — URINALYSIS, ROUTINE W REFLEX MICROSCOPIC
Bilirubin Urine: NEGATIVE
Glucose, UA: NEGATIVE mg/dL
Hgb urine dipstick: NEGATIVE
Ketones, ur: 5 mg/dL — AB
Nitrite: NEGATIVE
Protein, ur: 30 mg/dL — AB
Specific Gravity, Urine: 1.033 — ABNORMAL HIGH (ref 1.005–1.030)
WBC, UA: >50 WBC/hpf — ABNORMAL HIGH (ref 0–5)
pH: 5 (ref 5.0–8.0)

## 2020-10-07 LAB — CBC WITH DIFFERENTIAL/PLATELET
Abs Immature Granulocytes: 0.05 10*3/uL (ref 0.00–0.07)
Basophils Absolute: 0.1 10*3/uL (ref 0.0–0.1)
Basophils Relative: 1 %
Eosinophils Absolute: 0.3 10*3/uL (ref 0.0–0.5)
Eosinophils Relative: 3 %
HCT: 41.3 % (ref 36.0–46.0)
Hemoglobin: 13.3 g/dL (ref 12.0–15.0)
Immature Granulocytes: 1 %
Lymphocytes Relative: 36 %
Lymphs Abs: 3.2 10*3/uL (ref 0.7–4.0)
MCH: 29.8 pg (ref 26.0–34.0)
MCHC: 32.2 g/dL (ref 30.0–36.0)
MCV: 92.6 fL (ref 80.0–100.0)
Monocytes Absolute: 0.7 10*3/uL (ref 0.1–1.0)
Monocytes Relative: 8 %
Neutro Abs: 4.6 10*3/uL (ref 1.7–7.7)
Neutrophils Relative %: 51 %
Platelets: 257 10*3/uL (ref 150–400)
RBC: 4.46 MIL/uL (ref 3.87–5.11)
RDW: 13.2 % (ref 11.5–15.5)
WBC: 8.9 10*3/uL (ref 4.0–10.5)
nRBC: 0 % (ref 0.0–0.2)

## 2020-10-07 LAB — COMPREHENSIVE METABOLIC PANEL
ALT: 21 U/L (ref 0–44)
AST: 34 U/L (ref 15–41)
Albumin: 4.5 g/dL (ref 3.5–5.0)
Alkaline Phosphatase: 64 U/L (ref 38–126)
Anion gap: 7 (ref 5–15)
BUN: 19 mg/dL (ref 8–23)
CO2: 26 mmol/L (ref 22–32)
Calcium: 9.4 mg/dL (ref 8.9–10.3)
Chloride: 103 mmol/L (ref 98–111)
Creatinine, Ser: 1.15 mg/dL — ABNORMAL HIGH (ref 0.44–1.00)
GFR, Estimated: 49 mL/min — ABNORMAL LOW (ref >60)
Glucose, Bld: 78 mg/dL (ref 70–99)
Potassium: 3.7 mmol/L (ref 3.5–5.1)
Sodium: 136 mmol/L (ref 135–145)
Total Bilirubin: <0.1 mg/dL — ABNORMAL LOW (ref 0.3–1.2)
Total Protein: 7.1 g/dL (ref 6.5–8.1)

## 2020-10-07 LAB — HEMOGLOBIN A1C
Hgb A1c MFr Bld: 6 % — ABNORMAL HIGH (ref 4.8–5.6)
Mean Plasma Glucose: 125.5 mg/dL

## 2020-10-07 LAB — APTT: aPTT: 33 seconds (ref 24–36)

## 2020-10-07 LAB — SURGICAL PCR SCREEN
MRSA, PCR: NEGATIVE
Staphylococcus aureus: POSITIVE — AB

## 2020-10-07 LAB — PROTIME-INR
INR: 1 (ref 0.8–1.2)
Prothrombin Time: 12.9 seconds (ref 11.4–15.2)

## 2020-10-07 LAB — GLUCOSE, CAPILLARY: Glucose-Capillary: 99 mg/dL (ref 70–99)

## 2020-10-07 NOTE — Progress Notes (Signed)
UA results sent to Dr. Berenice Primas to review.

## 2020-10-14 ENCOUNTER — Other Ambulatory Visit (HOSPITAL_COMMUNITY)
Admission: RE | Admit: 2020-10-14 | Discharge: 2020-10-14 | Disposition: A | Discharge status: Home / Self Care | Payer: Medicare Other | Source: Ambulatory Visit | Attending: Orthopedic Surgery | Admitting: Orthopedic Surgery

## 2020-10-14 DIAGNOSIS — Z01812 Encounter for preprocedural laboratory examination: Secondary | ICD-10-CM | POA: Insufficient documentation

## 2020-10-14 DIAGNOSIS — Z20822 Contact with and (suspected) exposure to covid-19: Secondary | ICD-10-CM | POA: Insufficient documentation

## 2020-10-14 LAB — SARS CORONAVIRUS 2 (TAT 6-24 HRS): SARS Coronavirus 2: NEGATIVE

## 2020-10-16 ENCOUNTER — Encounter (HOSPITAL_COMMUNITY): Payer: Self-pay | Admitting: Orthopedic Surgery

## 2020-10-16 MED ORDER — BUPIVACAINE LIPOSOME 1.3 % IJ SUSP
20.0000 mL | Freq: Once | INTRAMUSCULAR | Status: DC
  Filled 2020-10-16: qty 20

## 2020-10-16 NOTE — Anesthesia Preprocedure Evaluation (Addendum)
Anesthesia Evaluation  Patient identified by MRN, date of birth, ID band Patient awake    Reviewed: Allergy & Precautions, NPO status , Patient's Chart, lab work & pertinent test results, reviewed documented beta blocker date and time   History of Anesthesia Complications (+) history of anesthetic complications  Airway Mallampati: I  TM Distance: >3 FB Neck ROM: Full    Dental no notable dental hx. (+) Teeth Intact   Pulmonary asthma , sleep apnea and Continuous Positive Airway Pressure Ventilation ,    Pulmonary exam normal breath sounds clear to auscultation       Cardiovascular hypertension, Pt. on medications and Pt. on home beta blockers Normal cardiovascular exam Rhythm:Regular Rate:Normal  EKG 10/07/20 NSR, Normal   ECHO 08/15/19 1. Left ventricular ejection fraction, by visual estimation, is 60 to 65%. The left ventricle has normal function. Left ventricular septal wall thickness was moderately increased. There is no left ventricular hypertrophy.  2. Global right ventricle has normal systolic function.The right ventricular size is normal. No increase in right ventricular wall thickness.  3. Left atrial size was normal.  4. Right atrial size was normal.  5. The mitral valve is normal in structure. No evidence of mitral valve  regurgitation. No evidence of mitral stenosis.  6. The tricuspid valve is normal in structure. Tricuspid valve regurgitation is not demonstrated.  7. The aortic valve is tricuspid. Aortic valve regurgitation is trivial. Mild aortic valve sclerosis without stenosis.  8. The pulmonic valve was normal in structure. Pulmonic valve regurgitation is not visualized.  9. The inferior vena cava is normal in size with greater than 50% respiratory variability, suggesting right atrial pressure of 3 mmHg.      Neuro/Psych PSYCHIATRIC DISORDERS Anxiety Depression Peripheral neuropathy    GI/Hepatic GERD   Medicated and Controlled,Hepatic steatosis   Endo/Other  diabetes, Well Controlled, Type 2, Oral Hypoglycemic AgentsMorbid obesityHyperlipidemia Hx/o left breast Ca S/P RT S/P lumpectomy  Renal/GU Renal diseaseHx/o renal calculi  negative genitourinary   Musculoskeletal  (+) Arthritis , Osteoarthritis,  OA right knee Scoliosis lumbar spine   Abdominal (+) + obese,   Peds  Hematology negative hematology ROS (+)   Anesthesia Other Findings Dextroscoliosis  Reproductive/Obstetrics                           Anesthesia Physical Anesthesia Plan  ASA: III  Anesthesia Plan: Spinal   Post-op Pain Management:  Regional for Post-op pain   Induction: Intravenous  PONV Risk Score and Plan: Propofol infusion, Treatment may vary due to age or medical condition and Ondansetron  Airway Management Planned: Natural Airway and Simple Face Mask  Additional Equipment:   Intra-op Plan:   Post-operative Plan:   Informed Consent:   Plan Discussed with:   Anesthesia Plan Comments:         Anesthesia Quick Evaluation

## 2020-10-17 ENCOUNTER — Ambulatory Visit (HOSPITAL_COMMUNITY): Payer: Medicare Other | Admitting: Anesthesiology

## 2020-10-17 ENCOUNTER — Encounter (HOSPITAL_COMMUNITY): Payer: Self-pay | Admitting: Orthopedic Surgery

## 2020-10-17 ENCOUNTER — Encounter (HOSPITAL_COMMUNITY)
Admission: RE | Disposition: A | Discharge status: Home / Self Care | Payer: Self-pay | Source: Other Acute Inpatient Hospital | Attending: Orthopedic Surgery

## 2020-10-17 ENCOUNTER — Observation Stay (HOSPITAL_COMMUNITY)
Admission: RE | Admit: 2020-10-17 | Discharge: 2020-10-19 | Disposition: A | Discharge status: Home / Self Care | Payer: Medicare Other | Source: Other Acute Inpatient Hospital | Attending: Orthopedic Surgery | Admitting: Orthopedic Surgery

## 2020-10-17 ENCOUNTER — Ambulatory Visit (HOSPITAL_COMMUNITY): Payer: Medicare Other | Admitting: Physician Assistant

## 2020-10-17 ENCOUNTER — Other Ambulatory Visit: Payer: Self-pay

## 2020-10-17 DIAGNOSIS — Z853 Personal history of malignant neoplasm of breast: Secondary | ICD-10-CM | POA: Insufficient documentation

## 2020-10-17 DIAGNOSIS — C50312 Malignant neoplasm of lower-inner quadrant of left female breast: Secondary | ICD-10-CM | POA: Diagnosis not present

## 2020-10-17 DIAGNOSIS — M1711 Unilateral primary osteoarthritis, right knee: Secondary | ICD-10-CM | POA: Diagnosis not present

## 2020-10-17 DIAGNOSIS — J45909 Unspecified asthma, uncomplicated: Secondary | ICD-10-CM | POA: Insufficient documentation

## 2020-10-17 DIAGNOSIS — K219 Gastro-esophageal reflux disease without esophagitis: Secondary | ICD-10-CM | POA: Diagnosis not present

## 2020-10-17 DIAGNOSIS — E119 Type 2 diabetes mellitus without complications: Secondary | ICD-10-CM | POA: Insufficient documentation

## 2020-10-17 DIAGNOSIS — Z96651 Presence of right artificial knee joint: Secondary | ICD-10-CM

## 2020-10-17 DIAGNOSIS — I10 Essential (primary) hypertension: Secondary | ICD-10-CM | POA: Diagnosis not present

## 2020-10-17 DIAGNOSIS — M25561 Pain in right knee: Secondary | ICD-10-CM | POA: Diagnosis present

## 2020-10-17 DIAGNOSIS — G8918 Other acute postprocedural pain: Secondary | ICD-10-CM | POA: Diagnosis not present

## 2020-10-17 HISTORY — PX: TOTAL KNEE ARTHROPLASTY: SHX125

## 2020-10-17 LAB — GLUCOSE, CAPILLARY
Glucose-Capillary: 125 mg/dL — ABNORMAL HIGH (ref 70–99)
Glucose-Capillary: 122 mg/dL — ABNORMAL HIGH (ref 70–99)
Glucose-Capillary: 150 mg/dL — ABNORMAL HIGH (ref 70–99)
Glucose-Capillary: 144 mg/dL — ABNORMAL HIGH (ref 70–99)

## 2020-10-17 LAB — TYPE AND SCREEN
ABO/RH(D): A POS
Antibody Screen: NEGATIVE

## 2020-10-17 LAB — ABO/RH: ABO/RH(D): A POS

## 2020-10-17 SURGERY — ARTHROPLASTY, KNEE, TOTAL
Anesthesia: Spinal | Site: Knee | Laterality: Right

## 2020-10-17 MED ORDER — OXYCODONE HCL 5 MG PO TABS
ORAL_TABLET | ORAL | Status: AC
  Administered 2020-10-17: 5 mg via ORAL
  Filled 2020-10-17: qty 1

## 2020-10-17 MED ORDER — BISACODYL 10 MG RE SUPP: 10.0000 mg | Freq: Every day | RECTAL | Status: DC | PRN

## 2020-10-17 MED ORDER — SENNA 8.6 MG PO TABS
1.0000 | ORAL_TABLET | Freq: Two times a day (BID) | ORAL | Status: DC
  Administered 2020-10-17 – 2020-10-19 (×3): 8.6 mg via ORAL
  Filled 2020-10-17 (×4): qty 1

## 2020-10-17 MED ORDER — METOCLOPRAMIDE HCL 5 MG/ML IJ SOLN: 5.0000 mg | Freq: Three times a day (TID) | INTRAMUSCULAR | Status: DC | PRN

## 2020-10-17 MED ORDER — TRANEXAMIC ACID-NACL 1000-0.7 MG/100ML-% IV SOLN
INTRAVENOUS | Status: AC
  Administered 2020-10-17: 1000 mg via INTRAVENOUS
  Filled 2020-10-17: qty 100

## 2020-10-17 MED ORDER — CEFAZOLIN SODIUM-DEXTROSE 2-4 GM/100ML-% IV SOLN
INTRAVENOUS | Status: AC
  Administered 2020-10-17: 2 g via INTRAVENOUS
  Filled 2020-10-17: qty 100

## 2020-10-17 MED ORDER — TRANEXAMIC ACID-NACL 1000-0.7 MG/100ML-% IV SOLN: 1000.0000 mg | Freq: Once | INTRAVENOUS | Status: AC

## 2020-10-17 MED ORDER — 0.9 % SODIUM CHLORIDE (POUR BTL) OPTIME
TOPICAL | Status: DC | PRN
  Administered 2020-10-17: 1000 mL

## 2020-10-17 MED ORDER — METOCLOPRAMIDE HCL 5 MG PO TABS: 5.0000 mg | ORAL_TABLET | Freq: Three times a day (TID) | ORAL | Status: DC | PRN

## 2020-10-17 MED ORDER — LACTATED RINGERS IV BOLUS
250.0000 mL | Freq: Once | INTRAVENOUS | Status: AC
  Administered 2020-10-17: 250 mL via INTRAVENOUS

## 2020-10-17 MED ORDER — BUPIVACAINE-EPINEPHRINE (PF) 0.25% -1:200000 IJ SOLN
INTRAMUSCULAR | Status: AC
  Filled 2020-10-17: qty 30

## 2020-10-17 MED ORDER — ONDANSETRON HCL 4 MG PO TABS: 4.0000 mg | ORAL_TABLET | Freq: Four times a day (QID) | ORAL | Status: DC | PRN

## 2020-10-17 MED ORDER — ONDANSETRON HCL 4 MG/2ML IJ SOLN
INTRAMUSCULAR | Status: DC | PRN
  Administered 2020-10-17: 4 mg via INTRAVENOUS

## 2020-10-17 MED ORDER — PROPOFOL 10 MG/ML IV BOLUS
INTRAVENOUS | Status: AC
  Filled 2020-10-17: qty 40

## 2020-10-17 MED ORDER — OXYCODONE HCL 5 MG PO TABS
5.0000 mg | ORAL_TABLET | ORAL | Status: DC | PRN
  Administered 2020-10-17: 5 mg via ORAL
  Administered 2020-10-17 – 2020-10-18 (×3): 10 mg via ORAL
  Filled 2020-10-17 (×3): qty 2

## 2020-10-17 MED ORDER — CEFAZOLIN SODIUM-DEXTROSE 2-4 GM/100ML-% IV SOLN
2.0000 g | Freq: Four times a day (QID) | INTRAVENOUS | Status: AC
  Administered 2020-10-17: 2 g via INTRAVENOUS
  Filled 2020-10-17: qty 100

## 2020-10-17 MED ORDER — ALUM & MAG HYDROXIDE-SIMETH 200-200-20 MG/5ML PO SUSP: 30.0000 mL | ORAL | Status: DC | PRN

## 2020-10-17 MED ORDER — SODIUM CHLORIDE (PF) 0.9 % IJ SOLN
INTRAMUSCULAR | Status: AC
  Filled 2020-10-17: qty 50

## 2020-10-17 MED ORDER — HYDROMORPHONE HCL 1 MG/ML IJ SOLN
0.5000 mg | INTRAMUSCULAR | Status: DC | PRN
  Administered 2020-10-17: 0.5 mg via INTRAVENOUS

## 2020-10-17 MED ORDER — FENTANYL CITRATE (PF) 100 MCG/2ML IJ SOLN
INTRAMUSCULAR | Status: AC
  Filled 2020-10-17: qty 2

## 2020-10-17 MED ORDER — ROPIVACAINE HCL 7.5 MG/ML IJ SOLN
INTRAMUSCULAR | Status: DC | PRN
  Administered 2020-10-17: 20 mL via PERINEURAL

## 2020-10-17 MED ORDER — PHENYLEPHRINE HCL-NACL 10-0.9 MG/250ML-% IV SOLN
INTRAVENOUS | Status: AC
  Filled 2020-10-17: qty 500

## 2020-10-17 MED ORDER — ASPIRIN 81 MG PO CHEW
81.0000 mg | CHEWABLE_TABLET | Freq: Two times a day (BID) | ORAL | Status: DC
  Administered 2020-10-17 – 2020-10-19 (×4): 81 mg via ORAL
  Filled 2020-10-17 (×4): qty 1

## 2020-10-17 MED ORDER — DOCUSATE SODIUM 100 MG PO CAPS
100.0000 mg | ORAL_CAPSULE | Freq: Two times a day (BID) | ORAL | Status: DC
  Administered 2020-10-17 – 2020-10-19 (×4): 100 mg via ORAL
  Filled 2020-10-17 (×4): qty 1

## 2020-10-17 MED ORDER — SODIUM CHLORIDE 0.9 % IR SOLN
Status: DC | PRN
  Administered 2020-10-17: 1000 mL

## 2020-10-17 MED ORDER — ORAL CARE MOUTH RINSE: 15.0000 mL | Freq: Once | OROMUCOSAL | Status: AC

## 2020-10-17 MED ORDER — FENTANYL CITRATE (PF) 100 MCG/2ML IJ SOLN: 25.0000 ug | INTRAMUSCULAR | Status: DC | PRN

## 2020-10-17 MED ORDER — LACTATED RINGERS IV BOLUS
500.0000 mL | Freq: Once | INTRAVENOUS | Status: AC
  Administered 2020-10-17: 500 mL via INTRAVENOUS

## 2020-10-17 MED ORDER — BUPIVACAINE LIPOSOME 1.3 % IJ SUSP
INTRAMUSCULAR | Status: DC | PRN
  Administered 2020-10-17: 20 mL

## 2020-10-17 MED ORDER — FENTANYL CITRATE (PF) 100 MCG/2ML IJ SOLN
INTRAMUSCULAR | Status: DC | PRN
  Administered 2020-10-17: 50 ug via INTRAVENOUS

## 2020-10-17 MED ORDER — PROPOFOL 500 MG/50ML IV EMUL
INTRAVENOUS | Status: DC | PRN
  Administered 2020-10-17: 75 ug/kg/min via INTRAVENOUS

## 2020-10-17 MED ORDER — DEXAMETHASONE SODIUM PHOSPHATE 10 MG/ML IJ SOLN
INTRAMUSCULAR | Status: DC | PRN
  Administered 2020-10-17: 4 mg via INTRAVENOUS

## 2020-10-17 MED ORDER — PHENOL 1.4 % MT LIQD: 1.0000 | OROMUCOSAL | Status: DC | PRN

## 2020-10-17 MED ORDER — DEXAMETHASONE SODIUM PHOSPHATE 10 MG/ML IJ SOLN
10.0000 mg | Freq: Once | INTRAMUSCULAR | Status: DC
  Filled 2020-10-17: qty 1

## 2020-10-17 MED ORDER — MIDAZOLAM HCL 5 MG/5ML IJ SOLN
INTRAMUSCULAR | Status: DC | PRN
  Administered 2020-10-17: 2 mg via INTRAVENOUS

## 2020-10-17 MED ORDER — DIPHENHYDRAMINE HCL 12.5 MG/5ML PO ELIX
12.5000 mg | ORAL_SOLUTION | ORAL | Status: DC | PRN
Start: 2020-10-17 — End: 2020-10-19
  Administered 2020-10-17: 25 mg via ORAL
  Filled 2020-10-17: qty 10

## 2020-10-17 MED ORDER — INSULIN ASPART 100 UNIT/ML ~~LOC~~ SOLN
0.0000 [IU] | Freq: Three times a day (TID) | SUBCUTANEOUS | Status: DC
  Administered 2020-10-19: 1 [IU] via SUBCUTANEOUS

## 2020-10-17 MED ORDER — LIDOCAINE 2% (20 MG/ML) 5 ML SYRINGE
INTRAMUSCULAR | Status: DC | PRN
  Administered 2020-10-17: 50 mg via INTRAVENOUS

## 2020-10-17 MED ORDER — SODIUM CHLORIDE 0.9% FLUSH
INTRAVENOUS | Status: DC | PRN
  Administered 2020-10-17: 50 mL

## 2020-10-17 MED ORDER — BUPIVACAINE-EPINEPHRINE (PF) 0.25% -1:200000 IJ SOLN
INTRAMUSCULAR | Status: DC | PRN
  Administered 2020-10-17: 30 mL via PERINEURAL

## 2020-10-17 MED ORDER — OXYCODONE HCL 5 MG PO TABS
10.0000 mg | ORAL_TABLET | ORAL | Status: DC | PRN
  Administered 2020-10-18 – 2020-10-19 (×5): 10 mg via ORAL
  Filled 2020-10-17 (×5): qty 2

## 2020-10-17 MED ORDER — TRANEXAMIC ACID-NACL 1000-0.7 MG/100ML-% IV SOLN
1000.0000 mg | INTRAVENOUS | Status: AC
  Administered 2020-10-17: 1000 mg via INTRAVENOUS
  Filled 2020-10-17: qty 100

## 2020-10-17 MED ORDER — WATER FOR IRRIGATION, STERILE IR SOLN
Status: DC | PRN
  Administered 2020-10-17: 1000 mL

## 2020-10-17 MED ORDER — OXYCODONE HCL 5 MG PO TABS
ORAL_TABLET | ORAL | Status: AC
  Filled 2020-10-17: qty 1

## 2020-10-17 MED ORDER — PHENYLEPHRINE HCL-NACL 10-0.9 MG/250ML-% IV SOLN
INTRAVENOUS | Status: DC | PRN
  Administered 2020-10-17: 15 ug/min via INTRAVENOUS

## 2020-10-17 MED ORDER — POVIDONE-IODINE 10 % EX SWAB
2.0000 "application " | Freq: Once | CUTANEOUS | Status: AC
  Administered 2020-10-17: 2 via TOPICAL

## 2020-10-17 MED ORDER — POLYETHYLENE GLYCOL 3350 17 G PO PACK
17.0000 g | PACK | Freq: Two times a day (BID) | ORAL | Status: DC
  Administered 2020-10-18 – 2020-10-19 (×2): 17 g via ORAL
  Filled 2020-10-17 (×4): qty 1

## 2020-10-17 MED ORDER — PROPOFOL 10 MG/ML IV BOLUS
INTRAVENOUS | Status: DC | PRN
  Administered 2020-10-17: 50 mg via INTRAVENOUS

## 2020-10-17 MED ORDER — CHLORHEXIDINE GLUCONATE 0.12 % MT SOLN
15.0000 mL | Freq: Once | OROMUCOSAL | Status: AC
  Administered 2020-10-17: 15 mL via OROMUCOSAL

## 2020-10-17 MED ORDER — METHOCARBAMOL 500 MG PO TABS
500.0000 mg | ORAL_TABLET | Freq: Four times a day (QID) | ORAL | Status: DC | PRN
  Administered 2020-10-17 – 2020-10-19 (×5): 500 mg via ORAL
  Filled 2020-10-17 (×5): qty 1

## 2020-10-17 MED ORDER — BUPIVACAINE IN DEXTROSE 0.75-8.25 % IT SOLN
INTRATHECAL | Status: DC | PRN
  Administered 2020-10-17: 1.5 mL via INTRATHECAL

## 2020-10-17 MED ORDER — MENTHOL 3 MG MT LOZG: 1.0000 | LOZENGE | OROMUCOSAL | Status: DC | PRN

## 2020-10-17 MED ORDER — MAGNESIUM CITRATE PO SOLN: 1.0000 | Freq: Once | ORAL | Status: DC | PRN

## 2020-10-17 MED ORDER — HYDROMORPHONE HCL 1 MG/ML IJ SOLN
INTRAMUSCULAR | Status: AC
  Filled 2020-10-17: qty 1

## 2020-10-17 MED ORDER — PROPOFOL 500 MG/50ML IV EMUL
INTRAVENOUS | Status: AC
  Filled 2020-10-17: qty 50

## 2020-10-17 MED ORDER — METHOCARBAMOL 500 MG IVPB - SIMPLE MED
INTRAVENOUS | Status: AC
  Administered 2020-10-17: 500 mg via INTRAVENOUS
  Filled 2020-10-17: qty 50

## 2020-10-17 MED ORDER — DOCUSATE SODIUM 100 MG PO CAPS: 100.0000 mg | ORAL_CAPSULE | Freq: Every day | ORAL | 2 refills | Status: AC | PRN

## 2020-10-17 MED ORDER — LIDOCAINE HCL (PF) 2 % IJ SOLN
INTRAMUSCULAR | Status: AC
  Filled 2020-10-17: qty 5

## 2020-10-17 MED ORDER — ONDANSETRON HCL 4 MG/2ML IJ SOLN: 4.0000 mg | Freq: Four times a day (QID) | INTRAMUSCULAR | Status: DC | PRN

## 2020-10-17 MED ORDER — ASPIRIN EC 325 MG PO TBEC: 325.0000 mg | DELAYED_RELEASE_TABLET | Freq: Two times a day (BID) | ORAL | 0 refills | Status: DC

## 2020-10-17 MED ORDER — PROPOFOL 1000 MG/100ML IV EMUL
INTRAVENOUS | Status: AC
  Filled 2020-10-17: qty 100

## 2020-10-17 MED ORDER — SODIUM CHLORIDE 0.9 % IV SOLN: INTRAVENOUS | Status: DC

## 2020-10-17 MED ORDER — METHOCARBAMOL 500 MG IVPB - SIMPLE MED
500.0000 mg | Freq: Four times a day (QID) | INTRAVENOUS | Status: DC | PRN
  Filled 2020-10-17: qty 50

## 2020-10-17 MED ORDER — LACTATED RINGERS IV SOLN: INTRAVENOUS | Status: DC

## 2020-10-17 MED ORDER — MIDAZOLAM HCL 2 MG/2ML IJ SOLN
INTRAMUSCULAR | Status: AC
  Filled 2020-10-17: qty 2

## 2020-10-17 MED ORDER — TIZANIDINE HCL 4 MG PO TABS: 4.0000 mg | ORAL_TABLET | Freq: Three times a day (TID) | ORAL | 1 refills | Status: DC

## 2020-10-17 MED ORDER — CELECOXIB 200 MG PO CAPS: 200.0000 mg | ORAL_CAPSULE | Freq: Two times a day (BID) | ORAL | 11 refills | Status: DC

## 2020-10-17 MED ORDER — ONDANSETRON HCL 4 MG/2ML IJ SOLN: 4.0000 mg | Freq: Once | INTRAMUSCULAR | Status: DC | PRN

## 2020-10-17 MED ORDER — FERROUS SULFATE 325 (65 FE) MG PO TABS
325.0000 mg | ORAL_TABLET | Freq: Three times a day (TID) | ORAL | Status: DC
  Administered 2020-10-17 – 2020-10-19 (×4): 325 mg via ORAL
  Filled 2020-10-17 (×5): qty 1

## 2020-10-17 MED ORDER — KETOROLAC TROMETHAMINE 15 MG/ML IJ SOLN
7.5000 mg | Freq: Four times a day (QID) | INTRAMUSCULAR | Status: AC
  Administered 2020-10-17 (×2): 7.5 mg via INTRAVENOUS
  Filled 2020-10-17 (×2): qty 1

## 2020-10-17 MED ORDER — CEFAZOLIN SODIUM-DEXTROSE 2-4 GM/100ML-% IV SOLN
2.0000 g | INTRAVENOUS | Status: AC
  Administered 2020-10-17: 2 g via INTRAVENOUS
  Filled 2020-10-17: qty 100

## 2020-10-17 MED ORDER — ACETAMINOPHEN 325 MG PO TABS
325.0000 mg | ORAL_TABLET | Freq: Four times a day (QID) | ORAL | Status: DC | PRN
Start: 2020-10-18 — End: 2020-10-19
  Administered 2020-10-18 (×3): 650 mg via ORAL
  Filled 2020-10-17 (×3): qty 2

## 2020-10-17 MED ORDER — DEXAMETHASONE SODIUM PHOSPHATE 10 MG/ML IJ SOLN
INTRAMUSCULAR | Status: AC
  Filled 2020-10-17: qty 1

## 2020-10-17 MED ORDER — ONDANSETRON HCL 4 MG/2ML IJ SOLN
INTRAMUSCULAR | Status: AC
  Filled 2020-10-17: qty 2

## 2020-10-17 MED ORDER — KETOROLAC TROMETHAMINE 15 MG/ML IJ SOLN
INTRAMUSCULAR | Status: AC
  Filled 2020-10-17: qty 1

## 2020-10-17 SURGICAL SUPPLY — 60 items
APL SKNCLS STERI-STRIP NONHPOA (GAUZE/BANDAGES/DRESSINGS) ×1
ATTUNE MED DOME PAT 38 KNEE (Knees) ×1 IMPLANT
ATTUNE PS FEM RT SZ 5 CEM KNEE (Femur) ×1 IMPLANT
ATTUNE PSRP INSE SZ5 7 KNEE (Insert) ×2 IMPLANT
BAG SPEC THK2 15X12 ZIP CLS (MISCELLANEOUS) ×1
BAG ZIPLOCK 12X15 (MISCELLANEOUS) ×2 IMPLANT
BASE TIBIAL ROT PLAT SZ 5 KNEE (Knees) ×1 IMPLANT
BENZOIN TINCTURE PRP APPL 2/3 (GAUZE/BANDAGES/DRESSINGS) ×2 IMPLANT
BLADE SAGITTAL 25.0X1.19X90 (BLADE) ×2 IMPLANT
BLADE SAW SGTL 13.0X1.19X90.0M (BLADE) ×2 IMPLANT
BLADE SURG SZ10 CARB STEEL (BLADE) ×4 IMPLANT
BNDG CMPR MED 10X6 ELC LF (GAUZE/BANDAGES/DRESSINGS) ×1
BNDG ELASTIC 6X10 VLCR STRL LF (GAUZE/BANDAGES/DRESSINGS) ×1 IMPLANT
BNDG ELASTIC 6X5.8 VLCR STR LF (GAUZE/BANDAGES/DRESSINGS) ×2 IMPLANT
BOOTIES KNEE HIGH SLOAN (MISCELLANEOUS) ×2 IMPLANT
BOWL SMART MIX CTS (DISPOSABLE) ×2 IMPLANT
BSPLAT TIB 5 CMNT ROT PLAT STR (Knees) ×1 IMPLANT
CEMENT HV SMART SET (Cement) ×4 IMPLANT
CLSR STERI-STRIP ANTIMIC 1/2X4 (GAUZE/BANDAGES/DRESSINGS) ×1 IMPLANT
COVER SURGICAL LIGHT HANDLE (MISCELLANEOUS) ×2 IMPLANT
COVER WAND RF STERILE (DRAPES) IMPLANT
CUFF TOURN SGL QUICK 34 (TOURNIQUET CUFF) ×2
CUFF TRNQT CYL 34X4.125X (TOURNIQUET CUFF) ×1 IMPLANT
DECANTER SPIKE VIAL GLASS SM (MISCELLANEOUS) ×4 IMPLANT
DRAPE U-SHAPE 47X51 STRL (DRAPES) ×2 IMPLANT
DRSG AQUACEL AG ADV 3.5X10 (GAUZE/BANDAGES/DRESSINGS) ×2 IMPLANT
DURAPREP 26ML APPLICATOR (WOUND CARE) ×2 IMPLANT
ELECT REM PT RETURN 15FT ADLT (MISCELLANEOUS) ×2 IMPLANT
GLOVE ECLIPSE 7.5 STRL STRAW (GLOVE) ×4 IMPLANT
GLOVE SRG 8 PF TXTR STRL LF DI (GLOVE) ×2 IMPLANT
GLOVE SURG UNDER POLY LF SZ8 (GLOVE) ×4
GOWN STRL REUS W/TWL XL LVL3 (GOWN DISPOSABLE) ×4 IMPLANT
HANDPIECE INTERPULSE COAX TIP (DISPOSABLE) ×2
HOLDER FOLEY CATH W/STRAP (MISCELLANEOUS) IMPLANT
HOOD PEEL AWAY FLYTE STAYCOOL (MISCELLANEOUS) ×6 IMPLANT
KIT TURNOVER KIT A (KITS) IMPLANT
MANIFOLD NEPTUNE II (INSTRUMENTS) ×2 IMPLANT
NEEDLE HYPO 22GX1.5 SAFETY (NEEDLE) ×2 IMPLANT
NS IRRIG 1000ML POUR BTL (IV SOLUTION) ×2 IMPLANT
PACK ICE MAXI GEL EZY WRAP (MISCELLANEOUS) ×2 IMPLANT
PACK TOTAL KNEE CUSTOM (KITS) ×2 IMPLANT
PADDING CAST ABS 6INX4YD NS (CAST SUPPLIES) ×1
PADDING CAST ABS COTTON 6X4 NS (CAST SUPPLIES) IMPLANT
PADDING CAST COTTON 6X4 STRL (CAST SUPPLIES) ×2 IMPLANT
PENCIL SMOKE EVACUATOR (MISCELLANEOUS) IMPLANT
PIN DRILL FIX HALF THREAD (BIT) ×1 IMPLANT
PIN STEINMAN FIXATION KNEE (PIN) ×2 IMPLANT
PROTECTOR NERVE ULNAR (MISCELLANEOUS) ×2 IMPLANT
SET HNDPC FAN SPRY TIP SCT (DISPOSABLE) ×1 IMPLANT
STRIP CLOSURE SKIN 1/2X4 (GAUZE/BANDAGES/DRESSINGS) IMPLANT
SUT MNCRL AB 3-0 PS2 18 (SUTURE) ×2 IMPLANT
SUT VIC AB 0 CT1 36 (SUTURE) ×2 IMPLANT
SUT VIC AB 1 CT1 36 (SUTURE) ×4 IMPLANT
SUT VIC AB 2-0 CT1 27 (SUTURE) ×6
SUT VIC AB 2-0 CT1 TAPERPNT 27 (SUTURE) IMPLANT
SYR CONTROL 10ML LL (SYRINGE) ×4 IMPLANT
TIBIAL BASE ROT PLAT SZ 5 KNEE (Knees) ×2 IMPLANT
TRAY FOLEY MTR SLVR 16FR STAT (SET/KITS/TRAYS/PACK) ×2 IMPLANT
WATER STERILE IRR 1000ML POUR (IV SOLUTION) ×4 IMPLANT
WRAP KNEE MAXI GEL POST OP (GAUZE/BANDAGES/DRESSINGS) ×1 IMPLANT

## 2020-10-17 NOTE — Progress Notes (Signed)
Notified patient's daughter, Mable Fill, about patient's progress. Encouraged her daughter to get something to eat as patient has not progressed enough for PT yet.

## 2020-10-17 NOTE — H&P (Signed)
TOTAL KNEE ADMISSION H&P  Patient is being admitted for right total knee arthroplasty.  Subjective:  Chief Complaint:right knee pain.  HPI: Candice Reid, 80 y.o. female, has a history of pain and functional disability in the right knee due to arthritis and has failed non-surgical conservative treatments for greater than 12 weeks to includeNSAID's and/or analgesics, corticosteriod injections, viscosupplementation injections, weight reduction as appropriate and activity modification.  Onset of symptoms was gradual, starting 5 years ago with gradually worsening course since that time. The patient noted no past surgery on the right knee(s).  Patient currently rates pain in the right knee(s) at 9 out of 10 with activity. Patient has night pain, worsening of pain with activity and weight bearing, pain that interferes with activities of daily living, pain with passive range of motion and joint swelling.  Patient has evidence of subchondral cysts, subchondral sclerosis, periarticular osteophytes, joint subluxation and joint space narrowing by imaging studies. This patient has had Failure of all reasonable conservative care. There is no active infection.  Patient Active Problem List   Diagnosis Date Noted  . Morbid obesity ([place]) 04/11/2018  . Hepatic steatosis 08/11/2015  . Right lower quadrant abdominal pain 05/06/2015  . Rectal bleeding 05/06/2015  . History of kidney stones 05/06/2015  . Osteoarthritis   . Vaginal dryness 02/10/2015  . Malignant neoplasm of lower-inner quadrant of left breast in female, estrogen receptor positive ([place]) 02/15/2014  . Personal history of failed conscious sedation 03/11/2011   Past Medical History:  Diagnosis Date  . Anxiety                                                                                                                                                                                                                                                                   . Arthritis   . Asthma   . Breast cancer ([place]) 2015  . Complication of anesthesia    shaky-hard to wake up, blood pressure dropped  . Dental bridge present   . Depression   . Diabetes ([place])   . Fatty liver   . GERD (gastroesophageal reflux disease)   . Hemorrhoids   . History of kidney stones   . Hot flashes   . HTN (hypertension)   .  Hyperlipemia   . Hypertension   . Kidney stones   . Neuropathy   . Obesity   . Obstructive sleep apnea   . Osteoarthritis bilteral knees  . Personal history of radiation therapy 2015  . Phobia   . Plantar fasciitis   . Radiation 05/14/14   44 Gy Left breast  . Wears glasses    reading    Past Surgical History:  Procedure Laterality Date  . BREAST LUMPECTOMY  1985   left-neg  . BREAST LUMPECTOMY Left 03/11/2014   left  . COLONOSCOPY  03/11/2011   diverticulosis, internal hemorrhoids  . CYSTOSCOPY W/ URETERAL STENT PLACEMENT     removed  . DILATION AND CURETTAGE OF UTERUS    . IR ABLATE LIVER CRYOABLATION  04/17/2020  . IR RADIOLOGIST EVAL & MGMT  04/03/2020  . IR RADIOLOGIST EVAL & MGMT  05/22/2020  . KNEE SURGERY Left    x 2-left  . OOPHORECTOMY Bilateral   . VAGINAL HYSTERECTOMY  1988   endometriosis, fibroids    Current Facility-Administered Medications  Medication Dose Route Frequency Provider Last Rate Last Admin  . bupivacaine liposome (EXPAREL) 1.3 % injection 266 mg  20 mL Other Once Dorna Leitz, MD      . ceFAZolin (ANCEF) IVPB 2g/100 mL premix  2 g Intravenous On Call to OR Dorna Leitz, MD      . lactated ringers infusion   Intravenous Continuous Myrtie Soman, MD 10 mL/hr at 10/17/20 0628 New Bag at 10/17/20 6073  . phenylephrine (NEOSYNEPHRINE) 10-0.9 MG/250ML-% infusion           . tranexamic acid (CYKLOKAPRON) IVPB 1,000 mg  1,000 mg Intravenous To OR Dorna Leitz, MD       Facility-Administered Medications Ordered in Other Encounters  Medication Dose Route Frequency Provider Last Rate Last Admin  .  fentaNYL (SUBLIMAZE) injection   Intravenous Anesthesia Intra-op Key, Kristopher, CRNA   50 mcg at 10/17/20 [phone]  . midazolam (VERSED) 5 MG/5ML injection   Intravenous Anesthesia Intra-op Key, Kristopher, CRNA   2 mg at 10/17/20 0653  . ropivacaine (PF) 7.5 mg/mL (0.75%) (NAROPIN) injection   Peri-NEURAL Anesthesia Intra-op Josephine Igo, MD   20 mL at 10/17/20 0650   Allergies  Allergen Reactions  . Ciprofloxacin     unknown  . Oxycodone-Acetaminophen Nausea Only  . Tyloxapol Nausea And Vomiting  . Ace Inhibitors Cough  . Metronidazole Nausea Only  . Morphine And Related Itching  . Oxycodone-Acetaminophen Nausea Only  . Septra [Sulfamethoxazole-Trimethoprim] Nausea Only  . Sulfa Antibiotics Nausea Only  . Valium [Diazepam] Other (See Comments)    depression    Social History   Tobacco Use  . Smoking status: Never Smoker  . Smokeless tobacco: Never Used  Substance Use Topics  . Alcohol use: No    Family History  Problem Relation Age of Onset  . Diabetes Mother   . Colon polyps Sister   . Lung cancer Sister   . Ovarian cancer Sister   . Brain cancer Sister   . Prostate cancer Father   . Brain cancer Brother   . Colon polyps Maternal Aunt   . Renal Disease Sister   . Arthritis Other        Neice  . Colon cancer Neg Hx      Review of Systems ROS: I have reviewed the patient's review of systems thoroughly and there are no positive responses as relates to the HPI. Objective:  Physical Exam  Vital signs in last  24 hours: Temp:  [98.8 F (37.1 C)] 98.8 F (37.1 C) (01/21 0600) Pulse Rate:  [80] 80 (01/21 0600) Resp:  [16] 16 (01/21 0600) BP: (161)/(84) 161/84 (01/21 0600) SpO2:  [97 %] 97 % (01/21 0600) Weight:  [102.5 kg] 102.5 kg (01/21 0626) Well-developed well-nourished patient in no acute distress. Alert and oriented x3 HEENT:within normal limits Cardiac: Regular rate and rhythm Pulmonary: Lungs clear to auscultation Abdomen: Soft and nontender.  Normal  active bowel sounds  Musculoskeletal: (Right knee: Painful range of motion.  Limited range of motion.  No instability.  Trace effusion.  Mild varus malalignment.) Labs: Recent Results (from the past 2160 hour(s))  Glucose, capillary     Status: None   Collection Time: 10/07/20  1:06 PM  Result Value Ref Range   Glucose-Capillary 99 70 - 99 mg/dL    Comment: Glucose reference range applies only to samples taken after fasting for at least 8 hours.  Surgical pcr screen     Status: Abnormal   Collection Time: 10/07/20  1:51 PM   Specimen: Nasal Mucosa; Nasal Swab  Result Value Ref Range   MRSA, PCR NEGATIVE NEGATIVE   Staphylococcus aureus POSITIVE (A) NEGATIVE    Comment: (NOTE) The Xpert SA Assay (FDA approved for NASAL specimens in patients 31 years of age and older), is one component of a comprehensive surveillance program. It is not intended to diagnose infection nor to guide or monitor treatment. Performed at [facility], [place] [address], [place], [place] [address]   CBC WITH DIFFERENTIAL     Status: None   Collection Time: 10/07/20  1:51 PM  Result Value Ref Range   WBC 8.9 4.0 - 10.5 K/uL   RBC 4.46 3.87 - 5.11 MIL/uL   Hemoglobin 13.3 12.0 - 15.0 g/dL   HCT 41.3 36.0 - 46.0 %   MCV 92.6 80.0 - 100.0 fL   MCH 29.8 26.0 - 34.0 pg   MCHC 32.2 30.0 - 36.0 g/dL   RDW 13.2 11.5 - 15.5 %   Platelets 257 150 - 400 K/uL   nRBC 0.0 0.0 - 0.2 %   Neutrophils Relative % 51 %   Neutro Abs 4.6 1.7 - 7.7 K/uL   Lymphocytes Relative 36 %   Lymphs Abs 3.2 0.7 - 4.0 K/uL   Monocytes Relative 8 %   Monocytes Absolute 0.7 0.1 - 1.0 K/uL   Eosinophils Relative 3 %   Eosinophils Absolute 0.3 0.0 - 0.5 K/uL   Basophils Relative 1 %   Basophils Absolute 0.1 0.0 - 0.1 K/uL   Immature Granulocytes 1 %   Abs Immature Granulocytes 0.05 0.00 - 0.07 K/uL    Comment: Performed at [facility], [place] [address], [place], [place] [address]  Comprehensive  metabolic panel     Status: Abnormal   Collection Time: 10/07/20  1:51 PM  Result Value Ref Range   Sodium 136 135 - 145 mmol/L   Potassium 3.7 3.5 - 5.1 mmol/L   Chloride 103 98 - 111 mmol/L   CO2 26 22 - 32 mmol/L   Glucose, Bld 78 70 - 99 mg/dL    Comment: Glucose reference range applies only to samples taken after fasting for at least 8 hours.   BUN 19 8 - 23 mg/dL   Creatinine, Ser 1.15 (H) 0.44 - 1.00 mg/dL   Calcium 9.4 8.9 - 10.3 mg/dL   Total Protein 7.1 6.5 - 8.1 g/dL   Albumin 4.5 3.5 - 5.0  g/dL   AST 34 15 - 41 U/L    Comment: RESULTS CONFIRMED BY MANUAL DILUTION   ALT 21 0 - 44 U/L    Comment: RESULTS CONFIRMED BY MANUAL DILUTION   Alkaline Phosphatase 64 38 - 126 U/L    Comment: RESULTS CONFIRMED BY MANUAL DILUTION   Total Bilirubin <0.1 (L) 0.3 - 1.2 mg/dL   GFR, Estimated 49 (L) >60 mL/min    Comment: (NOTE) Calculated using the CKD-EPI Creatinine Equation (2021)    Anion gap 7 5 - 15    Comment: Performed at [facility], [place] [address], [place], [place] [address]  Protime-INR     Status: None   Collection Time: 10/07/20  1:51 PM  Result Value Ref Range   Prothrombin Time 12.9 11.4 - 15.2 seconds   INR 1.0 0.8 - 1.2    Comment: (NOTE) INR goal varies based on device and disease states. Performed at [facility], [place] [address] [place], [place] [address]   APTT     Status: None   Collection Time: 10/07/20  1:51 PM  Result Value Ref Range   aPTT 33 24 - 36 seconds    Comment: Performed at [facility], [place] [address] [place], [place] [address]  Type and screen Order type and screen if day of surgery is less than 15 days from draw of preadmission visit or order morning of surgery if day of surgery is greater than 6 days from preadmission visit.     Status: None   Collection Time: 10/07/20  1:51 PM  Result Value Ref Range   ABO/RH(D) A POS    Antibody Screen NEG    Sample Expiration 10/21/2020,2359     Extend sample reason      NO TRANSFUSIONS OR PREGNANCY IN THE PAST 3 MONTHS Performed at [place] [address], [place], [place] [address]   Hemoglobin A1c per protocol     Status: Abnormal   Collection Time: 10/07/20  1:51 PM  Result Value Ref Range   Hgb A1c MFr Bld 6.0 (H) 4.8 - 5.6 %    Comment: (NOTE) Pre diabetes:          5.7%-6.4%  Diabetes:              >6.4%  Glycemic control for   <7.0% adults with diabetes    Mean Plasma Glucose 125.5 mg/dL    Comment: Performed at [place] [address] [place], [place] [address]  Urinalysis, Routine w reflex microscopic Urine, Clean Catch     Status: Abnormal   Collection Time: 10/07/20  2:13 PM  Result Value Ref Range   Color, Urine AMBER (A) YELLOW    Comment: BIOCHEMICALS MAY BE AFFECTED BY COLOR   APPearance CLOUDY (A) CLEAR   Specific Gravity, Urine 1.033 (H) 1.005 - 1.030   pH 5.0 5.0 - 8.0   Glucose, UA NEGATIVE NEGATIVE mg/dL   Hgb urine dipstick NEGATIVE NEGATIVE   Bilirubin Urine NEGATIVE NEGATIVE   Ketones, ur 5 (A) NEGATIVE mg/dL   Protein, ur 30 (A) NEGATIVE mg/dL   Nitrite NEGATIVE NEGATIVE   Leukocytes,Ua LARGE (A) NEGATIVE   RBC / HPF 11-20 0 - 5 RBC/hpf   WBC, UA >50 (H) 0 - 5 WBC/hpf   Bacteria, UA RARE (A) NONE SEEN   Squamous Epithelial / LPF 11-20 0 - 5   WBC Clumps PRESENT    Mucus PRESENT  Hyaline Casts, UA PRESENT    Ca Oxalate Crys, UA PRESENT    Non Squamous Epithelial 0-5 (A) NONE SEEN    Comment: Performed at [facility], [place] [address], [place], [place] [address]  SARS CORONAVIRUS 2 (TAT 6-24 HRS) Nasopharyngeal Nasopharyngeal Swab     Status: None   Collection Time: 10/14/20 11:06 AM   Specimen: Nasopharyngeal Swab  Result Value Ref Range   SARS Coronavirus 2 NEGATIVE NEGATIVE    Comment: (NOTE) SARS-CoV-2 target nucleic acids are NOT DETECTED.  The SARS-CoV-2 RNA is generally detectable in upper and lower respiratory  specimens during the acute phase of infection. Negative results do not preclude SARS-CoV-2 infection, do not rule out co-infections with other pathogens, and should not be used as the sole basis for treatment or other patient management decisions. Negative results must be combined with clinical observations, patient history, and epidemiological information. The expected result is Negative.  Fact Sheet for Patients: SugarRoll.be  Fact Sheet for Healthcare Providers: https://www.woods-mathews.com/  This test is not yet approved or cleared by the [place] FDA and  has been authorized for detection and/or diagnosis of SARS-CoV-2 by FDA under an Emergency Use Authorization (EUA). This EUA will remain  in effect (meaning this test can be used) for the duration of the COVID-19 declaration under Se ction 564(b)(1) of the Act, 21 U.S.C. section 360bbb-3(b)(1), unless the authorization is terminated or revoked sooner.  Performed at [facility], [place] [address] [place], [place] [address]   Glucose, capillary     Status: Abnormal   Collection Time: 10/17/20  6:00 AM  Result Value Ref Range   Glucose-Capillary 125 (H) 70 - 99 mg/dL    Comment: Glucose reference range applies only to samples taken after fasting for at least 8 hours.  ABO/Rh     Status: None   Collection Time: 10/17/20  6:10 AM  Result Value Ref Range   ABO/RH(D)      A POS Performed at [facility], [place] [address], [place], [place] [address]     Estimated body mass index is 38.79 kg/m as calculated from the following:   Height as of this encounter: 5\' 4"  (1.626 m).   Weight as of this encounter: 102.5 kg.   Imaging Review Plain radiographs demonstrate severe degenerative joint disease of the bilaterally knee(s). The overall alignment ismild varus. The bone quality appears to be fair for age and reported activity  level.      Assessment/Plan:  End stage arthritis, right knee   The patient history, physical examination, clinical judgment of the provider and imaging studies are consistent with end stage degenerative joint disease of the right knee(s) and total knee arthroplasty is deemed medically necessary. The treatment options including medical management, injection therapy arthroscopy and arthroplasty were discussed at length. The risks and benefits of total knee arthroplasty were presented and reviewed. The risks due to aseptic loosening, infection, stiffness, patella tracking problems, thromboembolic complications and other imponderables were discussed. The patient acknowledged the explanation, agreed to proceed with the plan and consent was signed. Patient is being admitted for inpatient treatment for surgery, pain control, PT, OT, prophylactic antibiotics, VTE prophylaxis, progressive ambulation and ADL's and discharge planning. The patient is planning to be discharged home with home health services     Patient's anticipated LOS is less than 2 midnights, meeting these requirements: - Younger than 88 - Lives within 1 hour of care - Has a competent adult at home  to recover with post-op recover - NO history of  - Chronic pain requiring opiods  - Diabetes  - Coronary Artery Disease  - Heart failure  - Heart attack  - Stroke  - DVT/VTE  - Cardiac arrhythmia  - Respiratory Failure/COPD  - Renal failure  - Anemia  - Advanced Liver disease

## 2020-10-17 NOTE — Transfer of Care (Signed)
Immediate Anesthesia Transfer of Care Note  Patient: Candice Reid  Procedure(s) Performed: Procedure(s): TOTAL KNEE ARTHROPLASTY (Right)  Patient Location: PACU  Anesthesia Type:Spinal  Level of Consciousness:  sedated, patient cooperative and responds to stimulation  Airway & Oxygen Therapy:Patient Spontanous Breathing and Patient connected to face mask oxgen  Post-op Assessment:  Report given to PACU RN and Post -op Vital signs reviewed and stable  Post vital signs:  Reviewed and stable  Last Vitals:  Vitals:   10/17/20 0600  BP: (!) 161/84  Pulse: 80  Resp: 16  Temp: 37.1 C  SpO2: 97%    Complications: No apparent anesthesia complications

## 2020-10-17 NOTE — Evaluation (Signed)
Physical Therapy Evaluation Patient Details Name: ADRIYANA GREENBAUM MRN: [id number] DOB: 1941-05-07 Today's Date: 10/17/2020   History of Present Illness  patient is a 80 yo female s/p R TKA on 10/17/2020 with PMH signifcant for neuropathy, HTN, HLD, GERD, DM, depression, anxiety, asthma, OA, radiation, and L knee surgery.  Clinical Impression  Pt is a 80yo female s/p R TKA POD 0. Pt reports modified independence with use of rollator for mobility at baseline. Pt required MIN guard/assist and verbal cues for sit to stand transfers.Pt required MIN guard for ambulation 80'ft with verbal cues for RW management and step to gait pattern with no LOB or knee buckling, pt with very slow cautious gait. Pt required seated rest prior to stair training. Pt performed stair negotiation with verbal cues for sequencing with 1 rail sideways and manual R knee block from therapist to promote knee extension as pt became fatigued. Pt reported feeling lightheaded, nauseous, and weak following stair training and was transitioned to wheelchair for seated rest break and provided cold wah cloths, pt found to be hypotensive. Pt running in 150's/80 and dropped to 90s/60's. Pt required Min assist for stand step back to bed for safety. Pt plans to stay with her daughter while recovering. Pt is currently not safe for discharge home 2/2 symptomatic hypotension with mobility.  Pt will benefit from skilled PT to increase independence and safety with mobility. Acute therapy to follow up during stay.      Follow Up Recommendations Follow surgeon's recommendation for DC plan and follow-up therapies;Outpatient PT    Equipment Recommendations  None recommended by PT    Recommendations for Other Services       Precautions / Restrictions Precautions Precautions: Fall Restrictions Weight Bearing Restrictions: No Other Position/Activity Restrictions: WBAT      Mobility  Bed Mobility Overal bed mobility: Needs Assistance Bed  Mobility: Supine to Sit;Sit to Supine     Supine to sit: Supervision;HOB elevated Sit to supine: Min guard   General bed mobility comments: Pt performed supine to sit transfer with use of bed rails and extra time  and B UEs to scoot to EOB. Pt required guarding to return to supine 2/2 symptoatic hypootension.    Transfers Overall transfer level: Needs assistance Equipment used: Rolling walker (2 wheeled) Transfers: Sit to/from [facility] Sit to Stand: Min guard;Mod assist Stand pivot transfers: Min assist       General transfer comment: Pt completed sit<>stand from EOB with MIN guard for steadying and required MOD assist and use of grab bars  for power up from toilet and MIN assist from wheelchair to EOB for safety and steadying.  Ambulation/Gait Ambulation/Gait assistance: Min guard;Min assist Gait Distance (Feet): 80 Feet Assistive device: Rolling walker (2 wheeled) Gait Pattern/deviations: Step-to pattern;Decreased stride length;Decreased weight shift to right Gait velocity: decr   General Gait Details: Pt ambulated with MIN assist progressing to MIN guard with verbal cues for RW management and gait pattern. Pt's daughter partiicpated in session and was able to demonstrate proper guarding with verbal cues from therapist.  Stairs Stairs: Yes Stairs assistance: Min assist Stair Management: One rail Right;Sideways Number of Stairs: 3 General stair comments: Pt required MIN assist on stairs for stability and manual R knee block from therapist to promote knee extension as pt became fatigued. Pt's daughter present for stair training. Following stair training pt complained of feeling weak,lightheaded, and nauseous, pt was transitioned to wheelchair and found to be hypotensive.  Wheelchair Mobility  Modified Rankin (Stroke Patients Only)       Balance Overall balance assessment: Needs assistance Sitting-balance support: Feet supported;Single extremity  supported Sitting balance-Leahy Scale: Fair     Standing balance support: Bilateral upper extremity supported Standing balance-Leahy Scale: Poor Standing balance comment: pt requied use of RW to maintain standing balance                             Pertinent Vitals/Pain Pain Assessment: 0-10 Pain Score: 5  Pain Location: Rt knee Pain Descriptors / Indicators: Sore;Tender Pain Intervention(s): Limited activity within patient's tolerance;Monitored during session;Repositioned    Home Living Family/patient expects to be discharged to:: Private residence Living Arrangements: Children Available Help at Discharge: Family Type of Home: House Home Access: Stairs to enter Entrance Stairs-Rails: Right Entrance Stairs-Number of Steps: 8 Home Layout: One level Home Equipment: Environmental consultant - 2 wheels;Bedside commode;Wheelchair - [employer] - 4 wheels Additional Comments: living environment is pt's daughter's hosue. Pt will be staying with daughter while recovering    Prior Function Level of Independence: Independent with assistive device(s)         Comments: pt uses rollator at baseline     Hand Dominance   Dominant Hand: Right    Extremity/Trunk Assessment   Upper Extremity Assessment Upper Extremity Assessment: Overall WFL for tasks assessed    Lower Extremity Assessment Lower Extremity Assessment: RLE deficits/detail RLE Deficits / Details: Pt with 4/5 quad set and ankle pf/df  strength. Pt  able to perform SLR, no extensor lag observed. RLE Sensation: WNL RLE Coordination: WNL    Cervical / Trunk Assessment Cervical / Trunk Assessment: Normal  Communication   Communication: No difficulties  Cognition Arousal/Alertness: Awake/alert Behavior During Therapy: WFL for tasks assessed/performed Overall Cognitive Status: Within Functional Limits for tasks assessed                                        General Comments      Exercises Total  Joint Exercises Ankle Circles/Pumps: AROM;Both;20 reps;Supine   Assessment/Plan    PT Assessment Patient needs continued PT services  PT Problem List Decreased strength;Decreased range of motion;Decreased activity tolerance;Decreased balance;Decreased mobility;Decreased knowledge of use of DME;Pain       PT Treatment Interventions Gait training;DME instruction;Stair training;Functional mobility training;Neuromuscular re-education;Therapeutic exercise;Balance training;Therapeutic activities;Patient/family education    PT Goals (Current goals can be found in the Care Plan section)  Acute Rehab PT Goals Patient Stated Goal: pt likes to return to doing crafts and floral arrangements PT Goal Formulation: With patient/family Time For Goal Achievement: 10/24/20 Potential to Achieve Goals: Good    Frequency 7X/week   Barriers to discharge        Co-evaluation               AM-PAC PT "6 Clicks" Mobility  Outcome Measure Help needed turning from your back to your side while in a flat bed without using bedrails?: A Little Help needed moving from lying on your back to sitting on the side of a flat bed without using bedrails?: A Little Help needed moving to and from a bed to a chair (including a wheelchair)?: A Lot Help needed standing up from a chair using your arms (e.g., wheelchair or bedside chair)?: A Little Help needed to walk in hospital room?: A Little Help needed climbing 3-5 steps with a railing? :  A Little 6 Click Score: 17    End of Session Equipment Utilized During Treatment: Gait belt Activity Tolerance: Treatment limited secondary to medical complications (Comment) (symptomatic hyptension) Patient left: in bed;with call bell/phone within reach;with family/visitor present Nurse Communication: Mobility status;Other (comment) (symptomatic hypotension during session) PT Visit Diagnosis: Unsteadiness on feet (R26.81);Muscle weakness (generalized) (M62.81);Pain Pain -  Right/Left: Right Pain - part of body: Knee    Time: [id number] PT Time Calculation (min) (ACUTE ONLY): 49 min   Charges:              Elna Breslow, SPT  Acute rehab    Elna Breslow 10/17/2020, 2:54 PM

## 2020-10-17 NOTE — Anesthesia Postprocedure Evaluation (Signed)
Anesthesia Post Note  Patient: TRAYCE CARAVELLO  Procedure(s) Performed: TOTAL KNEE ARTHROPLASTY (Right Knee)     Patient location during evaluation: PACU Anesthesia Type: Spinal Level of consciousness: oriented and awake and alert Pain management: pain level controlled Vital Signs Assessment: post-procedure vital signs reviewed and stable Respiratory status: spontaneous breathing, respiratory function stable and nonlabored ventilation Cardiovascular status: blood pressure returned to baseline and stable Postop Assessment: no headache, no backache, no apparent nausea or vomiting, spinal receding and patient able to bend at knees Anesthetic complications: no   No complications documented.  Last Vitals:  Vitals:   10/17/20 1045 10/17/20 1100  BP: (!) 150/89 (!) 149/90  Pulse: 75 77  Resp: 15 16  Temp:  (!) 36.4 C  SpO2: 100% 98%    Last Pain:  Vitals:   10/17/20 1100  TempSrc:   PainSc: 0-No pain                 Danyelle Brookover A.

## 2020-10-17 NOTE — Progress Notes (Signed)
Called Nehemiah Massed PA  About pt becoming orthostatic with symptoms when getting up to ambulate. Patient has 8 steps to get up into her house. Benjie Karvonen to call back after she speaks with Dr Berenice Primas.

## 2020-10-17 NOTE — Anesthesia Procedure Notes (Signed)
Anesthesia Regional Block: Adductor canal block   Pre-Anesthetic Checklist: ,, timeout performed, Correct Patient, Correct Site, Correct Laterality, Correct Procedure, Correct Position, site marked, Risks and benefits discussed,  Surgical consent,  Pre-op evaluation,  At surgeon's request and post-op pain management  Laterality: Right  Prep: chloraprep       Needles:  Injection technique: Single-shot  Needle Type: Echogenic Stimulator Needle     Needle Length: 9cm  Needle Gauge: 21   Needle insertion depth: 7 cm   Additional Needles:   Procedures:,,,, ultrasound used (permanent image in chart),,,,  Narrative:  Start time: 10/17/2020 6:50 AM End time: 10/17/2020 6:55 AM Injection made incrementally with aspirations every 5 mL.  Performed by: Personally  Anesthesiologist: Josephine Igo, MD  Additional Notes: Timeout performed. Patient sedated. Relevant anatomy ID'd using [place]. Incremental 2-56ml injection of LA with frequent aspiration. Patient tolerated procedure well.        Right Adductor Canal Block

## 2020-10-17 NOTE — Anesthesia Procedure Notes (Signed)
Spinal  Patient location during procedure: OR Start time: 10/17/2020 7:20 AM End time: 10/17/2020 7:24 AM Staffing Performed: anesthesiologist  Anesthesiologist: Josephine Igo, MD Preanesthetic Checklist Completed: patient identified, IV checked, site marked, risks and benefits discussed, surgical consent, monitors and equipment checked, pre-op evaluation and timeout performed Spinal Block Patient position: sitting Prep: DuraPrep and site prepped and draped Patient monitoring: heart rate, cardiac monitor, continuous pulse ox and blood pressure Approach: midline Location: L3-4 Injection technique: single-shot Needle Needle type: Pencan  Needle gauge: 24 G Needle length: 9 cm Needle insertion depth: 6 cm Assessment Sensory level: T4 Additional Notes Patient tolerated procedure well. Adequate sensory level.

## 2020-10-17 NOTE — Op Note (Signed)
PATIENT ID:      Candice Reid  MRN:     [id number] DOB/AGE:    1941-03-02 / 80 y.o.       OPERATIVE REPORT   DATE OF PROCEDURE:  10/17/2020      PREOPERATIVE DIAGNOSIS:   RIGHT KNEE PRIMARY OSTEOARTHRITIS      Estimated body mass index is 38.79 kg/m as calculated from the following:   Height as of this encounter: 5\' 4"  (1.626 m).   Weight as of this encounter: 102.5 kg.                                                       POSTOPERATIVE DIAGNOSIS:   Same                                                                  PROCEDURE:  Procedure(s): TOTAL KNEE ARTHROPLASTY Using DepuyAttune RP implants #5 Femur, #5Tibia, 7 mm Attune RP bearing, 38 Patella    SURGEON: Alta Corning  ASSISTANT:  Esaw Dace   (Present and scrubbed throughout the case, critical for assistance with exposure, retraction, instrumentation, and closure.)        ANESTHESIA: spinal, 20cc Exparel, 50cc 0.25% Marcaine EBL: min cc FLUID REPLACEMENT: unk cc crystaloid TOURNIQUET: DRAINS: None TRANEXAMIC ACID: 1gm IV, 2gm topical COMPLICATIONS:  None         INDICATIONS FOR PROCEDURE: The patient has  RIGHT KNEE PRIMARY OSTEOARTHRITIS, varus deformities, XR shows bone on bone arthritis, lateral subluxation of tibia. Patient has failed all conservative measures including anti-inflammatory medicines, narcotics, attempts at exercise and weight loss, cortisone injections and viscosupplementation.  Risks and benefits of surgery have been discussed, questions answered.   DESCRIPTION OF PROCEDURE: The patient identified by armband, received  IV antibiotics, in the holding area at [facility]. Patient taken to the operating room, appropriate anesthetic monitors were attached, and spinal anesthesia was  induced. IV Tranexamic acid was given.Tourniquet applied high to the operative thigh. Lateral post and foot positioner applied to the table, the lower extremity was then prepped and draped in usual sterile fashion  from the toes to the tourniquet. Time-out procedure was performed. Nehemiah Massed [facility], was present and scrubbed throughout the case, critical for assistance with, positioning, exposure, retraction, instrumentation, and closure.The skin and subcutaneous tissue along the incision was injected with 20 cc of a mixture of Exparel and Marcaine solution, using a 20-gauge by 1-1/2 inch needle. We began the operation, with the knee flexed 130 degrees, by making the anterior midline incision starting at handbreadth above the patella going over the patella 1 cm medial to and 4 cm distal to the tibial tubercle. Small bleeders in the skin and the subcutaneous tissue identified and cauterized. Transverse retinaculum was incised and reflected medially and a medial parapatellar arthrotomy was accomplished. the patella was everted and theprepatellar fat pad resected. The superficial medial collateral ligament was then elevated from anterior to posterior along the proximal flare of the tibia and anterior half of the menisci resected. The knee was hyperflexed exposing bone on bone arthritis. Peripheral and notch osteophytes  as well as the cruciate ligaments were then resected. We continued to work our way around posteriorly along the proximal tibia, and externally rotated the tibia subluxing it out from underneath the femur. A McHale PCL retractor was placed through the notch and a lateral Hohmann retractor placed, and we then entered the proximal tibia in line with the Depuy starter drill in line with the axis of the tibia followed by an intramedullary guide rod and 0-degree posterior slope cutting guide. The tibial cutting guide, 4 degree posterior sloped, was pinned into place allowing resection of 2 mm of bone medially and 10 mm of bone laterally. Satisfied with the tibial resection, we then entered the distal femur 2 mm anterior to the PCL origin with the intramedullary guide rod and applied the distal femoral cutting guide set at  9 mm, with 5 degrees of valgus. This was pinned along the epicondylar axis. At this point, the distal femoral cut was accomplished without difficulty. We then sized for a #5 femoral component and pinned the guide in 3 degrees of external rotation. The chamfer cutting guide was pinned into place. The anterior, posterior, and chamfer cuts were accomplished without difficulty followed by the Attune RP box cutting guide and the box cut. We also removed posterior osteophytes from the posterior femoral condyles. The posterior capsule was injected with Exparel solution. The knee was brought into full extension. We checked our extension gap and fit a 7 mm bearing. Distracting in extension with a lamina spreader,  bleeders in the posterior capsule, Posterior medial and posterior lateral gutter were cauterized.  The transexamic acid-soaked sponge was then placed in the gap of the knee in extension. The knee was flexed 30. The posterior patella cut was accomplished with the 9.5 mm Attune cutting guide, sized for a 58mm dome, and the fixation pegs drilled.The knee was then once again hyperflexed exposing the proximal tibia. We sized for a # 5 tibial base plate, applied the smokestack and the conical reamer followed by the the Delta fin keel punch. We then hammered into place the Attune RP trial femoral component, drilled the lugs, inserted a  7 mm trial bearing, trial patellar button, and took the knee through range of motion from 0-130 degrees. Medial and lateral ligamentous stability was checked. No thumb pressure was required for patellar Tracking. The tourniquet was released at 1 hr. All trial components were removed, mating surfaces irrigated with pulse lavage, and dried with suction and sponges. 10 cc of the Exparel solution was applied to the cancellus bone of the patella distal femur and proximal tibia.  After waiting 30 seconds, the bony surfaces were again, dried with sponges. A double batch of DePuy HV cement was  mixed and applied to all bony metallic mating surfaces except for the posterior condyles of the femur itself. In order, we hammered into place the tibial tray and removed excess cement, the femoral component and removed excess cement. The final Attune RP bearing was inserted, and the knee brought to full extension with compression. The patellar button was clamped into place, and excess cement removed. The knee was held at 30 flexion with compression, while the cement cured. The wound was irrigated out with normal saline solution pulse lavage. The rest of the Exparel was injected into the parapatellar arthrotomy, subcutaneous tissues, and periosteal tissues. The parapatellar arthrotomy was closed with running #1 Vicryl suture. The subcutaneous tissue with 3-0 undyed Vicryl suture, and the skin with running 3-0 SQ vicryl. An Aquacil and Ace  wrap were applied. The patient was taken to recovery room without difficulty.   Alta Corning 10/17/2020, 9:05 AM

## 2020-10-17 NOTE — Discharge Instructions (Signed)
You may bear weight as tolerated. Keep your dressing on and dry until follow up. Take medicine to prevent blood clots as directed. Take pain medicine as needed with the goal of transitioning to over the counter medicines.  If needed, you may increase breakthrough pain medication (oxycodone) for the first few days post op - up to 2 tablets every 4 hours.  Stop this medication as soon as you are able.  INSTRUCTIONS AFTER JOINT REPLACEMENT   o Remove items at home which could result in a fall. This includes throw rugs or furniture in walking pathways o ICE to the affected joint every three hours while awake for 30 minutes at a time, for at least the first 3-5 days, and then as needed for pain and swelling.  Continue to use ice for pain and swelling. You may notice swelling that will progress down to the foot and ankle.  This is normal after surgery.  Elevate your leg when you are not up walking on it.   o Continue to use the breathing machine you got in the hospital (incentive spirometer) which will help keep your temperature down.  It is common for your temperature to cycle up and down following surgery, especially at night when you are not up moving around and exerting yourself.  The breathing machine keeps your lungs expanded and your temperature down.   DIET:  As you were doing prior to hospitalization, we recommend a well-balanced diet.  DRESSING / WOUND CARE / SHOWERING  You may shower 3 days after surgery, but keep the wounds dry during showering.  You may use an occlusive plastic wrap (Press'n Seal for example) with blue painter's tape at edges, NO SOAKING/SUBMERGING IN THE BATHTUB.  If the bandage gets wet, change with a clean dry gauze.  If the incision gets wet, pat the wound dry with a clean towel.  ACTIVITY  o Increase activity slowly as tolerated, but follow the weight bearing instructions below.   o No driving for 6 weeks or until further direction given by your physician.  You  cannot drive while taking narcotics.  o No lifting or carrying greater than 10 lbs. until further directed by your surgeon. o Avoid periods of inactivity such as sitting longer than an hour when not asleep. This helps prevent blood clots.  o You may return to work once you are authorized by your doctor.     WEIGHT BEARING   Weight bearing as tolerated with assist device (walker, cane, etc) as directed, use it as long as suggested by your surgeon or therapist, typically at least 4-6 weeks.   EXERCISES  Results after joint replacement surgery are often greatly improved when you follow the exercise, range of motion and muscle strengthening exercises prescribed by your doctor. Safety measures are also important to protect the joint from further injury. Any time any of these exercises cause you to have increased pain or swelling, decrease what you are doing until you are comfortable again and then slowly increase them. If you have problems or questions, call your caregiver or physical therapist for advice.   Rehabilitation is important following a joint replacement. After just a few days of immobilization, the muscles of the leg can become weakened and shrink (atrophy).  These exercises are designed to build up the tone and strength of the thigh and leg muscles and to improve motion. Often times heat used for twenty to thirty minutes before working out will loosen up your tissues and help with   improving the range of motion but do not use heat for the first two weeks following surgery (sometimes heat can increase post-operative swelling).   These exercises can be done on a training (exercise) mat, on the floor, on a table or on a bed. Use whatever works the best and is most comfortable for you.    Use music or television while you are exercising so that the exercises are a pleasant break in your day. This will make your life better with the exercises acting as a break in your routine that you can look  forward to.   Perform all exercises about fifteen times, three times per day or as directed.  You should exercise both the operative leg and the other leg as well.  Exercises include:   . Quad Sets - Tighten up the muscle on the front of the thigh (Quad) and hold for 5-10 seconds.   . Straight Leg Raises - With your knee straight (if you were given a brace, keep it on), lift the leg to 60 degrees, hold for 3 seconds, and slowly lower the leg.  Perform this exercise against resistance later as your leg gets stronger.  . Leg Slides: Lying on your back, slowly slide your foot toward your buttocks, bending your knee up off the floor (only go as far as is comfortable). Then slowly slide your foot back down until your leg is flat on the floor again.  . Angel Wings: Lying on your back spread your legs to the side as far apart as you can without causing discomfort.  . Hamstring Strength:  Lying on your back, push your heel against the floor with your leg straight by tightening up the muscles of your buttocks.  Repeat, but this time bend your knee to a comfortable angle, and push your heel against the floor.  You may put a pillow under the heel to make it more comfortable if necessary.   A rehabilitation program following joint replacement surgery can speed recovery and prevent re-injury in the future due to weakened muscles. Contact your doctor or a physical therapist for more information on knee rehabilitation.    CONSTIPATION  Constipation is defined medically as fewer than three stools per week and severe constipation as less than one stool per week.  Even if you have a regular bowel pattern at home, your normal regimen is likely to be disrupted due to multiple reasons following surgery.  Combination of anesthesia, postoperative narcotics, change in appetite and fluid intake all can affect your bowels.   YOU MUST use at least one of the following options; they are listed in order of increasing strength  to get the job done.  They are all available over the counter, and you may need to use some, POSSIBLY even all of these options:    Drink plenty of fluids (prune juice may be helpful) and high fiber foods Colace 100 mg by mouth twice a day  Senokot for constipation as directed and as needed Dulcolax (bisacodyl), take with full glass of water  Miralax (polyethylene glycol) once or twice a day as needed.  If you have tried all these things and are unable to have a bowel movement in the first 3-4 days after surgery call either your surgeon or your primary doctor.    If you experience loose stools or diarrhea, hold the medications until you stool forms back up.  If your symptoms do not get better within 1 week or if they get worse,   check with your doctor.  If you experience "the worst abdominal pain ever" or develop nausea or vomiting, please contact the office immediately for further recommendations for treatment.   ITCHING:  If you experience itching with your medications, try taking only a single pain pill, or even half a pain pill at a time.  You can also use Benadryl over the counter for itching or also to help with sleep.   TED HOSE STOCKINGS:  Use stockings on both legs until for at least 2 weeks or as directed by physician office. They may be removed at night for sleeping.  MEDICATIONS:  See your medication summary on the "After Visit Summary" that nursing will review with you.  You may have some home medications which will be placed on hold until you complete the course of blood thinner medication.  It is important for you to complete the blood thinner medication as prescribed.  PRECAUTIONS:  If you experience chest pain or shortness of breath - call 911 immediately for transfer to the hospital emergency department.   If you develop a fever greater that 101 F, purulent drainage from wound, increased redness or drainage from wound, foul odor from the wound/dressing, or calf pain - CONTACT  YOUR SURGEON.                                                   FOLLOW-UP APPOINTMENTS:  If you do not already have a post-op appointment, please call the office for an appointment to be seen by your surgeon.  Guidelines for how soon to be seen are listed in your "After Visit Summary", but are typically between 1-4 weeks after surgery.  OTHER INSTRUCTIONS:  Dental Antibiotics:  In most cases prophylactic antibiotics for Dental procdeures after total joint surgery are not necessary.  Exceptions are as follows:  1. History of prior total joint infection  2. Severely immunocompromised (Organ Transplant, cancer chemotherapy, Rheumatoid biologic meds such as Humera)  3. Poorly controlled diabetes (A1C &gt; 8.0, blood glucose over 200)  If you have one of these conditions, contact your surgeon for an antibiotic prescription, prior to your dental procedure.   MAKE SURE YOU:  . Understand these instructions.  . Get help right away if you are not doing well or get worse.    Thank you for letting us be a part of your medical care team.  It is a privilege we respect greatly.  We hope these instructions will help you stay on track for a fast and full recovery!     

## 2020-10-18 DIAGNOSIS — Z96651 Presence of right artificial knee joint: Secondary | ICD-10-CM | POA: Diagnosis not present

## 2020-10-18 DIAGNOSIS — J45909 Unspecified asthma, uncomplicated: Secondary | ICD-10-CM | POA: Diagnosis not present

## 2020-10-18 DIAGNOSIS — I10 Essential (primary) hypertension: Secondary | ICD-10-CM | POA: Diagnosis not present

## 2020-10-18 DIAGNOSIS — E119 Type 2 diabetes mellitus without complications: Secondary | ICD-10-CM | POA: Diagnosis not present

## 2020-10-18 DIAGNOSIS — Z853 Personal history of malignant neoplasm of breast: Secondary | ICD-10-CM | POA: Diagnosis not present

## 2020-10-18 DIAGNOSIS — M1711 Unilateral primary osteoarthritis, right knee: Secondary | ICD-10-CM | POA: Diagnosis not present

## 2020-10-18 LAB — CBC
HCT: 34.2 % — ABNORMAL LOW (ref 36.0–46.0)
Hemoglobin: 10.7 g/dL — ABNORMAL LOW (ref 12.0–15.0)
MCH: 29.6 pg (ref 26.0–34.0)
MCHC: 31.3 g/dL (ref 30.0–36.0)
MCV: 94.5 fL (ref 80.0–100.0)
Platelets: 215 10*3/uL (ref 150–400)
RBC: 3.62 MIL/uL — ABNORMAL LOW (ref 3.87–5.11)
RDW: 13 % (ref 11.5–15.5)
WBC: 13.9 10*3/uL — ABNORMAL HIGH (ref 4.0–10.5)
nRBC: 0 % (ref 0.0–0.2)

## 2020-10-18 LAB — GLUCOSE, CAPILLARY
Glucose-Capillary: 100 mg/dL — ABNORMAL HIGH (ref 70–99)
Glucose-Capillary: 130 mg/dL — ABNORMAL HIGH (ref 70–99)
Glucose-Capillary: 135 mg/dL — ABNORMAL HIGH (ref 70–99)
Glucose-Capillary: 112 mg/dL — ABNORMAL HIGH (ref 70–99)
Glucose-Capillary: 138 mg/dL — ABNORMAL HIGH (ref 70–99)

## 2020-10-18 LAB — BASIC METABOLIC PANEL
Anion gap: 11 (ref 5–15)
BUN: 17 mg/dL (ref 8–23)
CO2: 25 mmol/L (ref 22–32)
Calcium: 8.5 mg/dL — ABNORMAL LOW (ref 8.9–10.3)
Chloride: 101 mmol/L (ref 98–111)
Creatinine, Ser: 0.82 mg/dL (ref 0.44–1.00)
GFR, Estimated: >60 mL/min (ref >60)
Glucose, Bld: 143 mg/dL — ABNORMAL HIGH (ref 70–99)
Potassium: 4 mmol/L (ref 3.5–5.1)
Sodium: 137 mmol/L (ref 135–145)

## 2020-10-18 MED ORDER — ALPRAZOLAM 0.5 MG PO TABS
0.5000 mg | ORAL_TABLET | Freq: Once | ORAL | Status: AC
  Administered 2020-10-18: 0.5 mg via ORAL
  Filled 2020-10-18: qty 1

## 2020-10-18 MED ORDER — ONDANSETRON HCL 4 MG PO TABS: 4.0000 mg | ORAL_TABLET | Freq: Every day | ORAL | 1 refills | Status: AC | PRN

## 2020-10-18 MED ORDER — CELECOXIB 200 MG PO CAPS: 200.0000 mg | ORAL_CAPSULE | Freq: Two times a day (BID) | ORAL | 2 refills | Status: AC

## 2020-10-18 MED ORDER — OXYCODONE-ACETAMINOPHEN 5-325 MG PO TABS: 1.0000 | ORAL_TABLET | ORAL | 0 refills | Status: AC | PRN

## 2020-10-18 MED ORDER — ASPIRIN EC 325 MG PO TBEC: 325.0000 mg | DELAYED_RELEASE_TABLET | Freq: Two times a day (BID) | ORAL | 0 refills | Status: AC

## 2020-10-18 MED ORDER — ALPRAZOLAM 0.5 MG PO TABS
0.5000 mg | ORAL_TABLET | Freq: Three times a day (TID) | ORAL | Status: DC | PRN
  Administered 2020-10-19: 0.5 mg via ORAL
  Filled 2020-10-18: qty 1

## 2020-10-18 MED ORDER — METHOCARBAMOL 500 MG PO TABS: 500.0000 mg | ORAL_TABLET | Freq: Four times a day (QID) | ORAL | 0 refills | Status: DC | PRN

## 2020-10-18 MED ORDER — CELECOXIB 200 MG PO CAPS: 200.0000 mg | ORAL_CAPSULE | Freq: Two times a day (BID) | ORAL | 11 refills | Status: DC

## 2020-10-18 NOTE — Progress Notes (Signed)
Pt shaky at this time with visible body shakes. Vs and blood glucose stable. Pt reports moderate pain. Pt skin normal, dry, and normal temperature. Rn encouraged pt to rest and relax. Rn will continue to monitor.

## 2020-10-18 NOTE — Progress Notes (Signed)
Physical Therapy Treatment Patient Details Name: Candice Reid MRN: [id number] DOB: [birth year] Today's Date: 10/18/2020    History of Present Illness patient is a 80 yo female s/p R TKA on 10/17/2020 with PMH signifcant for neuropathy, HTN, HLD, GERD, DM, depression, anxiety, asthma, OA, radiation, and L knee surgery.    PT Comments    Pt very cooperative and eager to progress.  Pt performed HEP and up to ambulate in hall but ltd again by onset of "I feel like I'm going to pass out".  Pt moved to sitting - BP 115/45 - RN aware.   Follow Up Recommendations  Follow surgeon's recommendation for DC plan and follow-up therapies;Outpatient PT     Equipment Recommendations  None recommended by PT    Recommendations for Other Services       Precautions / Restrictions Precautions Precautions: Fall Restrictions Weight Bearing Restrictions: No Other Position/Activity Restrictions: WBAT    Mobility  Bed Mobility Overal bed mobility: Needs Assistance Bed Mobility: Supine to Sit     Supine to sit: Supervision;HOB elevated     General bed mobility comments: Pt performed supine to sit transfer with use of bed rails and extra time  and B UEs to scoot to EOB.  Transfers Overall transfer level: Needs assistance Equipment used: Rolling walker (2 wheeled) Transfers: Sit to/from Stand Sit to Stand: Min guard         General transfer comment: cues for LE management and use of UEs to self assist  Ambulation/Gait Ambulation/Gait assistance: Min guard;Min assist Gait Distance (Feet): 58 Feet Assistive device: Rolling walker (2 wheeled) Gait Pattern/deviations: Step-to pattern;Decreased step length - right;Decreased step length - left;Shuffle;Trunk flexed;Antalgic Gait velocity: decr   General Gait Details: cues for posture, position from RW and sequence.  Distance ltd by c/o dizziness - BP 115/45   Stairs             Wheelchair Mobility    Modified Rankin (Stroke  Patients Only)       Balance Overall balance assessment: Needs assistance Sitting-balance support: Feet supported;No upper extremity supported Sitting balance-Leahy Scale: Fair     Standing balance support: Bilateral upper extremity supported Standing balance-Leahy Scale: Poor                              Cognition Arousal/Alertness: Awake/alert Behavior During Therapy: WFL for tasks assessed/performed Overall Cognitive Status: Within Functional Limits for tasks assessed                                        Exercises Total Joint Exercises Ankle Circles/Pumps: AROM;Both;20 reps;Supine Quad Sets: AROM;Both;10 reps;Supine Heel Slides: AAROM;Right;15 reps;Supine Straight Leg Raises: AAROM;Right;15 reps;Supine Goniometric ROM: AAROM R knee -6 - 40    General Comments        Pertinent Vitals/Pain Pain Assessment: 0-10 Pain Score: 5  Pain Location: Rt knee Pain Descriptors / Indicators: Sore;Tender Pain Intervention(s): Limited activity within patient's tolerance;Monitored during session;Premedicated before session;Ice applied    Home Living                      Prior Function            PT Goals (current goals can now be found in the care plan section) Acute Rehab PT Goals Patient Stated Goal: pt likes to return to doing  crafts and floral arrangements PT Goal Formulation: With patient/family Time For Goal Achievement: 10/24/20 Potential to Achieve Goals: Good Progress towards PT goals: Progressing toward goals    Frequency    7X/week      PT Plan Current plan remains appropriate    Co-evaluation              AM-PAC PT "6 Clicks" Mobility   Outcome Measure  Help needed turning from your back to your side while in a flat bed without using bedrails?: A Little Help needed moving from lying on your back to sitting on the side of a flat bed without using bedrails?: A Little Help needed moving to and from a bed to  a chair (including a wheelchair)?: A Little Help needed standing up from a chair using your arms (e.g., wheelchair or bedside chair)?: A Little Help needed to walk in hospital room?: A Little Help needed climbing 3-5 steps with a railing? : A Little 6 Click Score: 18    End of Session Equipment Utilized During Treatment: Gait belt Activity Tolerance: Treatment limited secondary to medical complications (Comment) Patient left: in chair;with call bell/phone within reach;with chair alarm set;with family/visitor present Nurse Communication: Mobility status;Other (comment) PT Visit Diagnosis: Unsteadiness on feet (R26.81);Muscle weakness (generalized) (M62.81);Pain Pain - Right/Left: Right Pain - part of body: Knee     Time: 0388-8280 PT Time Calculation (min) (ACUTE ONLY): 46 min  Charges:  $Gait Training: 8-22 mins $Therapeutic Exercise: 8-22 mins $Therapeutic Activity: 8-22 mins                     Debe Coder PT Acute Rehabilitation Services Pager [phone] Office [phone]    Ylonda Storr 10/18/2020, 12:28 PM

## 2020-10-18 NOTE — Progress Notes (Signed)
    Patient doing well PO day 1, kept overnight and doing much better today, IV came out overnight. She has been up with PT and cleared to go home with [facility]. She is eager to go home. She has been drinking fluids and feeling some better though a bit anxious. She has xanax at home she uses PRN   Physical Exam: Vitals:   10/18/20 0915 10/18/20 1249  BP: 140/62 (!) 159/62  Pulse: 85 87  Resp: 16 18  Temp: 97.8 F (36.6 C) 97.7 F (36.5 C)  SpO2: 97% 97%    Dressing in place, CDI, distal compartements soft, 2+ DPP and cap refill <2sec BIL, pt comfortable in bedside chair.  NVI  POD #1 s/p TKR by Dr Berenice Primas doing well, eager to progress home  - up with PT/OT, encourage ambulation - Percocet for pain, Robaxin for muscle spasms - ASA for anticoagulation - Celebrex for antiinflammatory  - likely d/c home today with f/u in 2 weeks - HH PT and assistive devices set up

## 2020-10-18 NOTE — TOC Progression Note (Signed)
Transition of Care [facility]) - Progression Note    Patient Details  Name: Candice Reid MRN: [id number] Date of Birth: 27-Dec-1940  Transition of [facility]) CM/SW Contact  Joaquin Courts, RN Phone Number: 10/18/2020, 12:19 PM  Clinical Narrative:    CM received notification from bedside RN stating patient would now like 3in1.  Referral given to [employer] rep Jermaine.   Expected Discharge Plan: [place] Barriers to Discharge: No Barriers Identified  Expected Discharge Plan and Services Expected Discharge Plan: [place]   Discharge Planning Services: CM Consult Post Acute Care Choice: [place] arrangements for the past 2 months: Single Family Home Expected Discharge Date: 10/17/20               DME Arranged: 3-N-1 DME Agency: Other - Comment [facility]) Date DME Agency Contacted: 10/18/20 Time DME Agency Contacted: 1219 Representative spoke with at DME Agency: Brenton Grills HH Arranged: PT [place]: [facility] [employer]     Representative spoke with at [place]: arranged in md office   Social Determinants of Health ([place]) Interventions    Readmission Risk Interventions No flowsheet data found.

## 2020-10-18 NOTE — Plan of Care (Signed)
Plan of care reviewed and discussed with the patient. 

## 2020-10-18 NOTE — Progress Notes (Signed)
kayla mckenzie pa paged concerning pt not being ready for d/c. Pt continues to get weak with short walks with physical therapy. Will attempt d/c tomorrow if possible.

## 2020-10-18 NOTE — TOC Initial Note (Signed)
Transition of Care [facility]) - Initial/Assessment Note    Patient Details  Name: Candice Reid MRN: [id number] Date of Birth: [birth year]  Transition of Care (TOC) CM/SW Contact:    Joaquin Courts, RN Phone Number: 10/18/2020, 9:36 AM  Clinical Narrative:                 CM spoke with patient's daughter who reports patient has rolling walker and they have ordered a raised toilet seat that can be used at a bedside commode if needed through [facility] and expect delivery on Monday.  [facility] to provide HHPT services.  Expected Discharge Plan: [place] Barriers to Discharge: No Barriers Identified   Patient Goals and CMS Choice Patient states their goals for this hospitalization and ongoing recovery are:: to go home with therapy CMS Medicare.gov Compare Post Acute Care list provided to:: Patient Represenative (must comment) Choice offered to / list presented to : Adult Children  Expected Discharge Plan and Services Expected Discharge Plan: [place]   Discharge Planning Services: CM Consult Post Acute Care Choice: [place] arrangements for the past 2 months: Single Family Home Expected Discharge Date: 10/17/20               DME Arranged: N/A DME Agency: NA       HH Arranged: PT [place] Agency: [facility] [employer]     Representative spoke with at [place]: arranged in md office  Prior Living Arrangements/Services Living arrangements for the past 2 months: [place]   Patient language and need for interpreter reviewed:: Yes Do you feel safe going back to the place where you live?: Yes      Need for Family Participation in Patient Care: Yes (Comment) Care giver support system in place?: Yes (comment)   Criminal Activity/Legal Involvement Pertinent to Current Situation/Hospitalization: No - Comment as needed  Activities of Daily Living Home Assistive Devices/Equipment: Cane (specify quad or  straight),Walker (specify type),CBG Meter,Blood pressure cuff,CPAP,Wheelchair,Built-in shower seat,Grab bars in shower,Eyeglasses ADL Screening (condition at time of admission) Patient's cognitive ability adequate to safely complete daily activities?: Yes Is the patient deaf or have difficulty hearing?: No Does the patient have difficulty seeing, even when wearing glasses/contacts?: No Does the patient have difficulty concentrating, remembering, or making decisions?: No Patient able to express need for assistance with ADLs?: Yes Does the patient have difficulty dressing or bathing?: No Independently performs ADLs?: Yes (appropriate for developmental age) Does the patient have difficulty walking or climbing stairs?: Yes Weakness of Legs: Both Weakness of Arms/Hands: None  Permission Sought/Granted                  Emotional Assessment Appearance:: Appears stated age         Psych Involvement: No (comment)  Admission diagnosis:  Status post right knee replacement [Z96.651] Patient Active Problem List   Diagnosis Date Noted  . Status post right knee replacement 10/17/2020  . Morbid obesity ([place]) 04/11/2018  . Hepatic steatosis 08/11/2015  . Right lower quadrant abdominal pain 05/06/2015  . Rectal bleeding 05/06/2015  . History of kidney stones 05/06/2015  . Osteoarthritis   . Vaginal dryness 02/10/2015  . Malignant neoplasm of lower-inner quadrant of left breast in female, estrogen receptor positive ([place]) 02/15/2014  . Personal history of failed conscious sedation 03/11/2011   PCP:  Maury Dus, MD Pharmacy:   [facility], [place]  [address] [place] [address] Phone: [phone] Fax: [phone]     Social Determinants of Health (SDOH) Interventions    Readmission Risk Interventions No flowsheet data found.

## 2020-10-18 NOTE — Progress Notes (Signed)
Physical Therapy Treatment Patient Details Name: Candice Reid MRN: [id number] DOB: [birth year] Today's Date: 10/18/2020    History of Present Illness patient is a 80 yo female s/p R TKA on 10/17/2020 with PMH signifcant for neuropathy, HTN, HLD, GERD, DM, depression, anxiety, asthma, OA, radiation, and L knee surgery.    PT Comments    Pt continues cooperative but with limited reserve and only able to ambulate limited distance and unable to attempt stairs.  Pt states "I want to try but both my knees feel like they are going to give out on me.  My other knee isn't very good either".     Follow Up Recommendations  Follow surgeon's recommendation for DC plan and follow-up therapies;Outpatient PT     Equipment Recommendations  None recommended by PT    Recommendations for Other Services       Precautions / Restrictions Precautions Precautions: Fall Restrictions Weight Bearing Restrictions: No Other Position/Activity Restrictions: WBAT    Mobility  Bed Mobility Overal bed mobility: Needs Assistance Bed Mobility: Supine to Sit;Sit to Supine     Supine to sit: Supervision;HOB elevated Sit to supine: Min assist   General bed mobility comments: Pt performed supine to sit transfer with use of bed rails and extra time  and B UEs to scoot to EOB.  Transfers Overall transfer level: Needs assistance Equipment used: Rolling walker (2 wheeled) Transfers: Sit to/from [employer] Sit to Stand: Min guard;Min assist Stand pivot transfers: Min guard       General transfer comment: cues for LE management and use of UEs to self assist  Ambulation/Gait Ambulation/Gait assistance: Min guard;Min assist Gait Distance (Feet): 35 Feet Assistive device: Rolling walker (2 wheeled) Gait Pattern/deviations: Step-to pattern;Decreased step length - right;Decreased step length - left;Shuffle;Trunk flexed;Antalgic Gait velocity: decr   General Gait Details: cues for posture,  position from RW and sequence.  Distance ltd by c/o fatigue - BP 137/62   Stairs Stairs:  (Attempted but unable)           Wheelchair Mobility    Modified Rankin (Stroke Patients Only)       Balance Overall balance assessment: Needs assistance Sitting-balance support: Feet supported;No upper extremity supported Sitting balance-Leahy Scale: Fair     Standing balance support: Bilateral upper extremity supported Standing balance-Leahy Scale: Poor                              Cognition Arousal/Alertness: Awake/alert Behavior During Therapy: WFL for tasks assessed/performed Overall Cognitive Status: Within Functional Limits for tasks assessed                                        Exercises      General Comments        Pertinent Vitals/Pain Pain Assessment: 0-10 Pain Score: 5  Pain Location: Rt knee Pain Descriptors / Indicators: Sore;Tender Pain Intervention(s): Limited activity within patient's tolerance;Monitored during session;Premedicated before session (pt declines ice)    Home Living                      Prior Function            PT Goals (current goals can now be found in the care plan section) Acute Rehab PT Goals Patient Stated Goal: pt likes to return to doing crafts and  floral arrangements PT Goal Formulation: With patient/family Time For Goal Achievement: 10/24/20 Potential to Achieve Goals: Good Progress towards PT goals: Progressing toward goals    Frequency    7X/week      PT Plan Current plan remains appropriate    Co-evaluation              AM-PAC PT "6 Clicks" Mobility   Outcome Measure  Help needed turning from your back to your side while in a flat bed without using bedrails?: A Little Help needed moving from lying on your back to sitting on the side of a flat bed without using bedrails?: A Little Help needed moving to and from a bed to a chair (including a wheelchair)?: A  Little Help needed standing up from a chair using your arms (e.g., wheelchair or bedside chair)?: A Little Help needed to walk in hospital room?: A Little Help needed climbing 3-5 steps with a railing? : A Little 6 Click Score: 18    End of Session Equipment Utilized During Treatment: Gait belt Activity Tolerance: Patient limited by fatigue Patient left: in bed;with call bell/phone within reach;with family/visitor present Nurse Communication: Mobility status;Other (comment) PT Visit Diagnosis: Unsteadiness on feet (R26.81);Muscle weakness (generalized) (M62.81);Pain Pain - Right/Left: Right Pain - part of body: Knee     Time: 8250-5397 PT Time Calculation (min) (ACUTE ONLY): 32 min  Charges:  $Gait Training: 8-22 mins $Therapeutic Activity: 8-22 mins                     [place] Pager [phone] Office [phone]    [facility] 10/18/2020, 4:49 PM

## 2020-10-18 NOTE — Plan of Care (Signed)
  Problem: Clinical Measurements: Goal: Diagnostic test results will improve Outcome: Progressing   Problem: Clinical Measurements: Goal: Respiratory complications will improve Outcome: Progressing   Problem: Activity: Goal: Risk for activity intolerance will decrease Outcome: Progressing   Problem: Elimination: Goal: Will not experience complications related to bowel motility Outcome: Progressing   Problem: Skin Integrity: Goal: Risk for impaired skin integrity will decrease Outcome: Progressing   Problem: Education: Goal: Knowledge of the prescribed therapeutic regimen will improve Outcome: Progressing   Problem: Clinical Measurements: Goal: Postoperative complications will be avoided or minimized Outcome: Progressing

## 2020-10-19 DIAGNOSIS — Z853 Personal history of malignant neoplasm of breast: Secondary | ICD-10-CM | POA: Diagnosis not present

## 2020-10-19 DIAGNOSIS — M1711 Unilateral primary osteoarthritis, right knee: Secondary | ICD-10-CM | POA: Diagnosis not present

## 2020-10-19 DIAGNOSIS — I10 Essential (primary) hypertension: Secondary | ICD-10-CM | POA: Diagnosis not present

## 2020-10-19 DIAGNOSIS — J45909 Unspecified asthma, uncomplicated: Secondary | ICD-10-CM | POA: Diagnosis not present

## 2020-10-19 DIAGNOSIS — E119 Type 2 diabetes mellitus without complications: Secondary | ICD-10-CM | POA: Diagnosis not present

## 2020-10-19 LAB — CBC
HCT: 29.7 % — ABNORMAL LOW (ref 36.0–46.0)
Hemoglobin: 9.6 g/dL — ABNORMAL LOW (ref 12.0–15.0)
MCH: 30 pg (ref 26.0–34.0)
MCHC: 32.3 g/dL (ref 30.0–36.0)
MCV: 92.8 fL (ref 80.0–100.0)
Platelets: 197 10*3/uL (ref 150–400)
RBC: 3.2 MIL/uL — ABNORMAL LOW (ref 3.87–5.11)
RDW: 13.2 % (ref 11.5–15.5)
WBC: 10.8 10*3/uL — ABNORMAL HIGH (ref 4.0–10.5)
nRBC: 0 % (ref 0.0–0.2)

## 2020-10-19 LAB — GLUCOSE, CAPILLARY
Glucose-Capillary: 124 mg/dL — ABNORMAL HIGH (ref 70–99)
Glucose-Capillary: 109 mg/dL — ABNORMAL HIGH (ref 70–99)

## 2020-10-19 LAB — BASIC METABOLIC PANEL
Anion gap: 7 (ref 5–15)
BUN: 14 mg/dL (ref 8–23)
CO2: 28 mmol/L (ref 22–32)
Calcium: 8.8 mg/dL — ABNORMAL LOW (ref 8.9–10.3)
Chloride: 103 mmol/L (ref 98–111)
Creatinine, Ser: 0.68 mg/dL (ref 0.44–1.00)
GFR, Estimated: >60 mL/min (ref >60)
Glucose, Bld: 135 mg/dL — ABNORMAL HIGH (ref 70–99)
Potassium: 3.6 mmol/L (ref 3.5–5.1)
Sodium: 138 mmol/L (ref 135–145)

## 2020-10-19 NOTE — Progress Notes (Signed)
The patient is alert and oriented and has been seen by her physician. The orders for discharge were written. IV was already removed. Went over discharge instructions with patient and family. She is being discharged via wheelchair with all of her belongings.

## 2020-10-19 NOTE — Progress Notes (Signed)
Physical Therapy Treatment Patient Details Name: Candice Reid MRN: [id number] DOB: [birth year] Today's Date: 10/19/2020    History of Present Illness patient is a 80 yo female s/p R TKA on 10/17/2020 with PMH signifcant for neuropathy, HTN, HLD, GERD, DM, depression, anxiety, asthma, OA, radiation, and L knee surgery.    PT Comments    Pt in good spirits and up to ambulate increased distance in hall - no c/o dizziness but fatigued at end of session.  Pt and dtr state they are comfortable with dc home at present level and plan to bump pt up stairs with assist to three men to eliminate that obstacle to returning home.   Follow Up Recommendations  Follow surgeon's recommendation for DC plan and follow-up therapies;Outpatient PT     Equipment Recommendations  None recommended by PT    Recommendations for Other Services       Precautions / Restrictions Precautions Precautions: Fall Restrictions Weight Bearing Restrictions: No Other Position/Activity Restrictions: WBAT    Mobility  Bed Mobility Overal bed mobility: Needs Assistance Bed Mobility: Supine to Sit     Supine to sit: Supervision     General bed mobility comments: Pt up in chair and requests back to same  Transfers Overall transfer level: Needs assistance Equipment used: Rolling walker (2 wheeled) Transfers: Sit to/from Stand Sit to Stand: Min guard;Supervision         General transfer comment: cues for LE management and use of UEs to self assist  Ambulation/Gait Ambulation/Gait assistance: Min guard;Supervision Gait Distance (Feet): 92 Feet Assistive device: Rolling walker (2 wheeled) Gait Pattern/deviations: Step-to pattern;Decreased step length - right;Decreased step length - left;Shuffle;Trunk flexed;Antalgic Gait velocity: decr   General Gait Details: cues for posture, position from RW and sequence.  Distance ltd by c/o fatigue - BP 146/69   Stairs             Wheelchair Mobility     Modified Rankin (Stroke Patients Only)       Balance Overall balance assessment: Needs assistance Sitting-balance support: Feet supported;No upper extremity supported Sitting balance-Leahy Scale: Good     Standing balance support: Bilateral upper extremity supported Standing balance-Leahy Scale: Fair Standing balance comment: pt requied use of RW to maintain standing balance                            Cognition Arousal/Alertness: Awake/alert Behavior During Therapy: WFL for tasks assessed/performed Overall Cognitive Status: Within Functional Limits for tasks assessed                                        Exercises Total Joint Exercises Ankle Circles/Pumps: AROM;Both;20 reps;Supine Quad Sets: AROM;Both;10 reps;Supine Heel Slides: AAROM;Right;Supine;20 reps Straight Leg Raises: AAROM;Right;Supine;20 reps    General Comments        Pertinent Vitals/Pain Pain Assessment: 0-10 Pain Score: 5  Pain Location: Rt knee Pain Descriptors / Indicators: Sore;Tender Pain Intervention(s): Limited activity within patient's tolerance;Monitored during session;Premedicated before session;Ice applied    Home Living                      Prior Function            PT Goals (current goals can now be found in the care plan section) Acute Rehab PT Goals Patient Stated Goal: Regain IND PT Goal Formulation: With  patient/family Time For Goal Achievement: 10/24/20 Potential to Achieve Goals: Good Progress towards PT goals: Progressing toward goals    Frequency    7X/week      PT Plan Current plan remains appropriate    Co-evaluation              AM-PAC PT "6 Clicks" Mobility   Outcome Measure  Help needed turning from your back to your side while in a flat bed without using bedrails?: A Little Help needed moving from lying on your back to sitting on the side of a flat bed without using bedrails?: A Little Help needed moving to and  from a bed to a chair (including a wheelchair)?: A Little Help needed standing up from a chair using your arms (e.g., wheelchair or bedside chair)?: A Little Help needed to walk in hospital room?: A Little Help needed climbing 3-5 steps with a railing? : Total 6 Click Score: 16    End of Session Equipment Utilized During Treatment: Gait belt Activity Tolerance: Patient tolerated treatment well;Patient limited by fatigue Patient left: in chair;with call bell/phone within reach;with family/visitor present Nurse Communication: Mobility status PT Visit Diagnosis: Unsteadiness on feet (R26.81);Muscle weakness (generalized) (M62.81);Pain Pain - Right/Left: Right Pain - part of body: Knee     Time: 1210-1232 PT Time Calculation (min) (ACUTE ONLY): 22 min  Charges:  $Gait Training: 8-22 mins $Therapeutic Exercise: 8-22 mins                     [place] Pager [phone] Office [phone]    Zilah Villaflor 10/19/2020, 1:08 PM

## 2020-10-19 NOTE — Plan of Care (Signed)

## 2020-10-19 NOTE — Progress Notes (Signed)
Physical Therapy Treatment Patient Details Name: RALPH BENAVIDEZ MRN: [id number] DOB: 03/10/1941 Today's Date: 10/19/2020    History of Present Illness patient is a 80 yo female s/p R TKA on 10/17/2020 with PMH signifcant for neuropathy, HTN, HLD, GERD, DM, depression, anxiety, asthma, OA, radiation, and L knee surgery.    PT Comments    Pt in good spirits and requiring decreased assist for most activity but continues to fatigue easily with ambulation and unable to attempt stairs 2* c/o bil LE weakness.  Dtr present and states she and three men plan to bump pt up stairs in wc   Follow Up Recommendations  Follow surgeon's recommendation for DC plan and follow-up therapies;Outpatient PT     Equipment Recommendations  None recommended by PT    Recommendations for Other Services       Precautions / Restrictions Precautions Precautions: Fall Restrictions Weight Bearing Restrictions: No Other Position/Activity Restrictions: WBAT    Mobility  Bed Mobility Overal bed mobility: Needs Assistance Bed Mobility: Supine to Sit     Supine to sit: Supervision     General bed mobility comments: Pt performed supine to sit transfer with use of bed rails and extra time  and B UEs to scoot to EOB.  Transfers Overall transfer level: Needs assistance Equipment used: Rolling walker (2 wheeled) Transfers: Sit to/from Stand Sit to Stand: Min guard;Supervision         General transfer comment: cues for LE management and use of UEs to self assist  Ambulation/Gait Ambulation/Gait assistance: Min guard Gait Distance (Feet): 35 Feet Assistive device: Rolling walker (2 wheeled) Gait Pattern/deviations: Step-to pattern;Decreased step length - right;Decreased step length - left;Shuffle;Trunk flexed;Antalgic Gait velocity: decr   General Gait Details: cues for posture, position from RW and sequence.  Distance ltd by c/o fatigue - BP 146/69   Stairs             Wheelchair Mobility     Modified Rankin (Stroke Patients Only)       Balance Overall balance assessment: Needs assistance Sitting-balance support: Feet supported;No upper extremity supported Sitting balance-Leahy Scale: Good     Standing balance support: Bilateral upper extremity supported Standing balance-Leahy Scale: Poor Standing balance comment: pt requied use of RW to maintain standing balance                            Cognition Arousal/Alertness: Awake/alert Behavior During Therapy: WFL for tasks assessed/performed Overall Cognitive Status: Within Functional Limits for tasks assessed                                        Exercises Total Joint Exercises Ankle Circles/Pumps: AROM;Both;20 reps;Supine Quad Sets: AROM;Both;10 reps;Supine Heel Slides: AAROM;Right;Supine;20 reps Straight Leg Raises: AAROM;Right;Supine;20 reps    General Comments        Pertinent Vitals/Pain Pain Assessment: 0-10 Pain Score: 5  Pain Location: Rt knee Pain Descriptors / Indicators: Sore;Tender Pain Intervention(s): Limited activity within patient's tolerance;Monitored during session;Premedicated before session;Ice applied    Home Living                      Prior Function            PT Goals (current goals can now be found in the care plan section) Acute Rehab PT Goals Patient Stated Goal: Regain IND PT  Goal Formulation: With patient/family Time For Goal Achievement: 10/24/20 Potential to Achieve Goals: Good Progress towards PT goals: Progressing toward goals    Frequency    7X/week      PT Plan Current plan remains appropriate    Co-evaluation              AM-PAC PT "6 Clicks" Mobility   Outcome Measure  Help needed turning from your back to your side while in a flat bed without using bedrails?: A Little Help needed moving from lying on your back to sitting on the side of a flat bed without using bedrails?: A Little Help needed moving to and  from a bed to a chair (including a wheelchair)?: A Little Help needed standing up from a chair using your arms (e.g., wheelchair or bedside chair)?: A Little Help needed to walk in hospital room?: A Little Help needed climbing 3-5 steps with a railing? : Total 6 Click Score: 16    End of Session Equipment Utilized During Treatment: Gait belt Activity Tolerance: Patient limited by fatigue Patient left: in chair;with call bell/phone within reach;with family/visitor present Nurse Communication: Mobility status PT Visit Diagnosis: Unsteadiness on feet (R26.81);Muscle weakness (generalized) (M62.81);Pain Pain - Right/Left: Right Pain - part of body: Knee     Time: 1020-1052 PT Time Calculation (min) (ACUTE ONLY): 32 min  Charges:  $Gait Training: 8-22 mins $Therapeutic Exercise: 8-22 mins                     [place] Pager [phone] Office [phone]    Ajna Moors 10/19/2020, 11:04 AM

## 2020-10-20 ENCOUNTER — Encounter (HOSPITAL_COMMUNITY): Payer: Self-pay | Admitting: Orthopedic Surgery

## 2020-10-21 DIAGNOSIS — E1140 Type 2 diabetes mellitus with diabetic neuropathy, unspecified: Secondary | ICD-10-CM | POA: Diagnosis not present

## 2020-10-21 DIAGNOSIS — Z96651 Presence of right artificial knee joint: Secondary | ICD-10-CM | POA: Diagnosis not present

## 2020-10-21 DIAGNOSIS — K219 Gastro-esophageal reflux disease without esophagitis: Secondary | ICD-10-CM | POA: Diagnosis not present

## 2020-10-21 DIAGNOSIS — K649 Unspecified hemorrhoids: Secondary | ICD-10-CM | POA: Diagnosis not present

## 2020-10-21 DIAGNOSIS — G4733 Obstructive sleep apnea (adult) (pediatric): Secondary | ICD-10-CM | POA: Diagnosis not present

## 2020-10-21 DIAGNOSIS — I10 Essential (primary) hypertension: Secondary | ICD-10-CM | POA: Diagnosis not present

## 2020-10-21 DIAGNOSIS — J45909 Unspecified asthma, uncomplicated: Secondary | ICD-10-CM | POA: Diagnosis not present

## 2020-10-21 DIAGNOSIS — Z791 Long term (current) use of non-steroidal anti-inflammatories (NSAID): Secondary | ICD-10-CM | POA: Diagnosis not present

## 2020-10-21 DIAGNOSIS — E785 Hyperlipidemia, unspecified: Secondary | ICD-10-CM | POA: Diagnosis not present

## 2020-10-21 DIAGNOSIS — Z7984 Long term (current) use of oral hypoglycemic drugs: Secondary | ICD-10-CM | POA: Diagnosis not present

## 2020-10-21 DIAGNOSIS — Z7982 Long term (current) use of aspirin: Secondary | ICD-10-CM | POA: Diagnosis not present

## 2020-10-21 DIAGNOSIS — F32A Depression, unspecified: Secondary | ICD-10-CM | POA: Diagnosis not present

## 2020-10-21 DIAGNOSIS — Z471 Aftercare following joint replacement surgery: Secondary | ICD-10-CM | POA: Diagnosis not present

## 2020-10-21 NOTE — Discharge Summary (Signed)
Patient ID: Candice Reid MRN: [id number] DOB/AGE: [birth year] 80 y.o.  Admit date: 10/17/2020 Discharge date: 10/18/2020  Admission Diagnoses:  Active Problems:   Status post right knee replacement   Discharge Diagnoses:  Same  Past Medical History:  Diagnosis Date  . Anxiety                                                                                                                                                                                                                                                                  . Arthritis   . Asthma   . Breast cancer ([place]) 2015  . Complication of anesthesia    shaky-hard to wake up, blood pressure dropped  . Dental bridge present   . Depression   . Diabetes ([place])   . Fatty liver   . GERD (gastroesophageal reflux disease)   . Hemorrhoids   . History of kidney stones   . Hot flashes   . HTN (hypertension)   . Hyperlipemia   . Hypertension   . Kidney stones   . Neuropathy   . Obesity   . Obstructive sleep apnea   . Osteoarthritis bilteral knees  . Personal history of radiation therapy 2015  . Phobia   . Plantar fasciitis   . Radiation 05/14/14   44 Gy Left breast  . Wears glasses    reading    Surgeries: Procedure(s): TOTAL KNEE ARTHROPLASTY on 10/17/2020   Consultants:   Discharged Condition: Improved  Hospital Course: Candice Reid is an 80 y.o. female who was admitted 10/17/2020 for operative treatment of<principal problem not specified>. Patient has severe unremitting pain that affects sleep, daily activities, and work/hobbies. After pre-op clearance the patient was taken to the operating room on 10/17/2020 and underwent  Procedure(s): TOTAL KNEE ARTHROPLASTY.  Pt was feeling poorly after surgery and unable to clear PT.  She was admitted overnight and did well the following AM and was cleared for discharge.  Patient was given perioperative antibiotics:  Anti-infectives (From admission, onward)   Start      Dose/Rate Route Frequency Ordered Stop   10/17/20 1400  ceFAZolin (ANCEF) IVPB 2g/100 mL premix        2 g 200 mL/hr over 30 Minutes Intravenous Every 6 hours  10/17/20 1005 10/17/20 2022   10/17/20 0600  ceFAZolin (ANCEF) IVPB 2g/100 mL premix        2 g 200 mL/hr over 30 Minutes Intravenous On call to O.R. 10/17/20 1829 10/17/20 0731       Patient was given sequential compression devices, early ambulation, and chemoprophylaxis to prevent DVT.  Patient benefited maximally from hospital stay and there were no complications.    Recent vital signs: No data found.   Recent laboratory studies:  Recent Labs    10/19/20 0307  WBC 10.8*  HGB 9.6*  HCT 29.7*  PLT 197  NA 138  K 3.6  CL 103  CO2 28  BUN 14  CREATININE 0.68  GLUCOSE 135*  CALCIUM 8.8*     Discharge Medications:   Allergies as of 10/19/2020      Reactions   Ciprofloxacin    unknown   Oxycodone-acetaminophen Nausea Only   Tyloxapol Nausea And Vomiting   Ace Inhibitors Cough   Metronidazole Nausea Only   Morphine And Related Itching   Oxycodone-acetaminophen Nausea Only   Septra [sulfamethoxazole-trimethoprim] Nausea Only   Sulfa Antibiotics Nausea Only   Valium [diazepam] Other (See Comments)   depression      Medication List    STOP taking these medications   diclofenac 75 MG EC tablet Commonly known as: VOLTAREN   traMADol 50 MG tablet Commonly known as: ULTRAM     TAKE these medications   acetaminophen 500 MG tablet Commonly known as: TYLENOL Take 1,000 mg by mouth every 8 (eight) hours as needed for moderate pain.   ALPRAZolam 0.5 MG tablet Commonly known as: XANAX Take 0.25-0.5 mg by mouth 3 (three) times daily as needed for anxiety.   aspirin EC 325 MG tablet Take 1 tablet (325 mg total) by mouth in the morning and at bedtime.   celecoxib 200 MG capsule Commonly known as: CeleBREX Take 1 capsule (200 mg total) by mouth 2 (two) times daily.   cholecalciferol 1000 units  tablet Commonly known as: VITAMIN D Take 1,000 Units by mouth daily.   docusate sodium 100 MG capsule Commonly known as: Colace Take 1 capsule (100 mg total) by mouth daily as needed.   DULoxetine 60 MG capsule Commonly known as: CYMBALTA Take 60 mg by mouth daily.   methocarbamol 500 MG tablet Commonly known as: ROBAXIN Take 1-2 tablets (500-1,000 mg total) by mouth every 6 (six) hours as needed for muscle spasms.   metoprolol succinate 100 MG 24 hr tablet Commonly known as: TOPROL-XL Take 100 mg by mouth daily.   olmesartan 20 MG tablet Commonly known as: BENICAR Take 20 mg by mouth daily.   Omega-3 1000 MG Caps Take 1,000 mg by mouth daily.   omeprazole 20 MG tablet Commonly known as: PRILOSEC OTC Take 20 mg by mouth daily.   ondansetron 4 MG tablet Commonly known as: Zofran Take 1 tablet (4 mg total) by mouth daily as needed for nausea or vomiting.   oxyCODONE-acetaminophen 5-325 MG tablet Commonly known as: Percocet Take 1-2 tablets by mouth every 4 (four) hours as needed for up to 7 days for severe pain.   pravastatin 40 MG tablet Commonly known as: PRAVACHOL Take 40 mg by mouth 2 (two) times a week.   sitaGLIPtin 100 MG tablet Commonly known as: JANUVIA Take 100 mg by mouth daily.            Discharge Care Instructions  (From admission, onward)  Start     Ordered   10/17/20 0000  Change dressing       Comments: Change the dressing daily with sterile 4 x 4 inch gauze dressing and apply TED hose.  You may clean the incision with alcohol prior to redressing.   10/17/20 1018          Diagnostic Studies: DG Chest 2 View  Result Date: 10/07/2020 CLINICAL DATA:  Preop for right knee replacement. EXAM: CHEST - 2 VIEW COMPARISON:  None. FINDINGS: The cardiomediastinal contours are normal. Mild aortic atherosclerosis. The lungs are clear. Pulmonary vasculature is normal. No consolidation, pleural effusion, or pneumothorax. No acute osseous  abnormalities are seen. Thoracic spondylosis. IMPRESSION: Unremarkable radiographs of the chest. Aortic Atherosclerosis (ICD10-I70.0). Electronically Signed   By: Keith Rake M.D.   On: 10/07/2020 22:06    Disposition: Discharge disposition: 01-Home or Self Care       Discharge Instructions    CPM   Complete by: As directed    Continuous passive motion machine (CPM):      Use the CPM from 0 to 60 for 6-8 hours per day.      You may increase by 5-10 per day.  You may break it up into 2 or 3 sessions per day.      Use CPM for 3-4 weeks or until you are told to stop.   Call MD / Call 911   Complete by: As directed    If you experience chest pain or shortness of breath, CALL 911 and be transported to the hospital emergency room.  If you develope a fever above 101 F, pus (white drainage) or increased drainage or redness at the wound, or calf pain, call your surgeon's office.   Change dressing   Complete by: As directed    Change the dressing daily with sterile 4 x 4 inch gauze dressing and apply TED hose.  You may clean the incision with alcohol prior to redressing.   Constipation Prevention   Complete by: As directed    Drink plenty of fluids.  Prune juice and/or coffee may be helpful.  You may use a stool softener, such as Colace (over the counter) 100 mg twice a day.  Use MiraLax (over the counter) for constipation as needed but this may take several days to work.  Mag Citrate --OR-- Milk of Magnesia may also be used but follow directions on the label.   Diet - low sodium heart healthy   Complete by: As directed    Discharge instructions   Complete by: As directed    You may bear weight as tolerated. Keep your dressing on and dry until follow up.You may remove the TED stockings and remove the ace wrap with the padding underneath on the third day after surgery, but continue the brown dressing on the knee. Take medicine to prevent blood clots as directed. Take pain medicine as  needed with the goal of transitioning to over the counter medicines.  If needed, you may increase breakthrough pain medication (oxycodone) for the first few days post op - up to 2 tablets every 4 hours.  Stop this medication as soon as you are able.  INSTRUCTIONS AFTER JOINT REPLACEMENT   Remove items at home which could result in a fall. This includes throw rugs or furniture in walking pathways ICE to the affected joint every three hours while awake for 30 minutes at a time, for at least the first 3-5 days, and then as needed for  pain and swelling.  Continue to use ice for pain and swelling. You may notice swelling that will progress down to the foot and ankle.  This is normal after surgery.  Elevate your leg when you are not up walking on it.   Continue to use the breathing machine you got in the hospital (incentive spirometer) which will help keep your temperature down.  It is common for your temperature to cycle up and down following surgery, especially at night when you are not up moving around and exerting yourself.  The breathing machine keeps your lungs expanded and your temperature down.   DIET:  As you were doing prior to hospitalization, we recommend a well-balanced diet.  DRESSING / WOUND CARE / SHOWERING  You may shower 3 days after surgery, but keep the wounds dry during showering.  You may use an occlusive plastic wrap (Press'n Seal for example) with blue painter's tape at edges, NO SOAKING/SUBMERGING IN THE BATHTUB.  If the bandage gets wet, change with a clean dry gauze.  If the incision gets wet, pat the wound dry with a clean towel.  ACTIVITY  Increase activity slowly as tolerated, but follow the weight bearing instructions below.   No driving for 6 weeks or until further direction given by your physician.  You cannot drive while taking narcotics.  No lifting or carrying greater than 10 lbs. until further directed by your surgeon. Avoid periods of inactivity such as sitting  longer than an hour when not asleep. This helps prevent blood clots.  You may return to work once you are authorized by your doctor.     WEIGHT BEARING   Weight bearing as tolerated with assist device (walker, cane, etc) as directed, use it as long as suggested by your surgeon or therapist, typically at least 4-6 weeks.   EXERCISES  Results after joint replacement surgery are often greatly improved when you follow the exercise, range of motion and muscle strengthening exercises prescribed by your doctor. Safety measures are also important to protect the joint from further injury. Any time any of these exercises cause you to have increased pain or swelling, decrease what you are doing until you are comfortable again and then slowly increase them. If you have problems or questions, call your caregiver or physical therapist for advice.   Rehabilitation is important following a joint replacement. After just a few days of immobilization, the muscles of the leg can become weakened and shrink (atrophy).  These exercises are designed to build up the tone and strength of the thigh and leg muscles and to improve motion. Often times heat used for twenty to thirty minutes before working out will loosen up your tissues and help with improving the range of motion but do not use heat for the first two weeks following surgery (sometimes heat can increase post-operative swelling).   These exercises can be done on a training (exercise) mat, on the floor, on a table or on a bed. Use whatever works the best and is most comfortable for you.    Use music or television while you are exercising so that the exercises are a pleasant break in your day. This will make your life better with the exercises acting as a break in your routine that you can look forward to.   Perform all exercises about fifteen times, three times per day or as directed.  You should exercise both the operative leg and the other leg as well.  Exercises  include:  Quad Sets - Tighten up the muscle on the front of the thigh (Quad) and hold for 5-10 seconds.   Straight Leg Raises - With your knee straight (if you were given a brace, keep it on), lift the leg to 60 degrees, hold for 3 seconds, and slowly lower the leg.  Perform this exercise against resistance later as your leg gets stronger.  Leg Slides: Lying on your back, slowly slide your foot toward your buttocks, bending your knee up off the floor (only go as far as is comfortable). Then slowly slide your foot back down until your leg is flat on the floor again.  Angel Wings: Lying on your back spread your legs to the side as far apart as you can without causing discomfort.  Hamstring Strength:  Lying on your back, push your heel against the floor with your leg straight by tightening up the muscles of your buttocks.  Repeat, but this time bend your knee to a comfortable angle, and push your heel against the floor.  You may put a pillow under the heel to make it more comfortable if necessary.   A rehabilitation program following joint replacement surgery can speed recovery and prevent re-injury in the future due to weakened muscles. Contact your doctor or a physical therapist for more information on knee rehabilitation.    CONSTIPATION  Constipation is defined medically as fewer than three stools per week and severe constipation as less than one stool per week.  Even if you have a regular bowel pattern at home, your normal regimen is likely to be disrupted due to multiple reasons following surgery.  Combination of anesthesia, postoperative narcotics, change in appetite and fluid intake all can affect your bowels.   YOU MUST use at least one of the following options; they are listed in order of increasing strength to get the job done.  They are all available over the counter, and you may need to use some, POSSIBLY even all of these options:    Drink plenty of fluids (prune juice may be helpful)  and high fiber foods Colace 100 mg by mouth twice a day  Senokot for constipation as directed and as needed Dulcolax (bisacodyl), take with full glass of water  Miralax (polyethylene glycol) once or twice a day as needed.  If you have tried all these things and are unable to have a bowel movement in the first 3-4 days after surgery call either your surgeon or your primary doctor.    If you experience loose stools or diarrhea, hold the medications until you stool forms back up.  If your symptoms do not get better within 1 week or if they get worse, check with your doctor.  If you experience "the worst abdominal pain ever" or develop nausea or vomiting, please contact the office immediately for further recommendations for treatment.   ITCHING:  If you experience itching with your medications, try taking only a single pain pill, or even half a pain pill at a time.  You can also use Benadryl over the counter for itching or also to help with sleep.   TED HOSE STOCKINGS:  Use stockings on both legs until for at least 2 weeks or as directed by physician office. They may be removed at night for sleeping.  MEDICATIONS:  See your medication summary on the "After Visit Summary" that nursing will review with you.  You may have some home medications which will be placed on hold until you complete the course of  blood thinner medication.  It is important for you to complete the blood thinner medication as prescribed.  PRECAUTIONS:  If you experience chest pain or shortness of breath - call 911 immediately for transfer to the hospital emergency department.   If you develop a fever greater that 101 F, purulent drainage from wound, increased redness or drainage from wound, foul odor from the wound/dressing, or calf pain - CONTACT YOUR SURGEON.                                                   FOLLOW-UP APPOINTMENTS:  If you do not already have a post-op appointment, please call the office for an appointment to be  seen by your surgeon.  Guidelines for how soon to be seen are listed in your "After Visit Summary", but are typically between 1-4 weeks after surgery.  OTHER INSTRUCTIONS:  Dental Antibiotics:  In most cases prophylactic antibiotics for Dental procdeures after total joint surgery are not necessary.  Exceptions are as follows:  1. History of prior total joint infection  2. Severely immunocompromised (Organ Transplant, cancer chemotherapy, Rheumatoid biologic meds such as Weir)  3. Poorly controlled diabetes (A1C &gt; 8.0, blood glucose over 200)  If you have one of these conditions, contact your surgeon for an antibiotic prescription, prior to your dental procedure.   MAKE SURE YOU:  Understand these instructions.  Get help right away if you are not doing well or get worse.    Thank you for letting us be a part of your medical care team.  It is a privilege we respect greatly.  We hope these instructions will help you stay on track for a fast and full recovery!   Discharge patient   Complete by: As directed    Discharge disposition: 01-Home or Self Care   Discharge patient date: 10/18/2020   Discharge patient   Complete by: As directed    Discharge disposition: 01-Home or Self Care   Discharge patient date: 10/19/2020   Do not put a pillow under the knee. Place it under the heel.   Complete by: As directed    Place yellow block under heel at all times except when up walking or in CPM.  You must sleep in it at night   Increase activity slowly as tolerated   Complete by: As directed    Patient may shower   Complete by: As directed    You may shower over the brown dressing.  Once the dressing is removed you may shower without a dressing once there is no drainage.  Do not wash over the wound.  If drainage remains, cover wound with plastic wrap and then shower   TED hose   Complete by: As directed    Use stockings (TED hose) for 2 weeks on both leg(s).  You may remove them at  night for sleeping.       Follow-up Information    Dorna Leitz, MD In 2 weeks.   Specialty: Orthopedic Surgery Contact information: [address] [place] [address] [phone]        [facility] Follow up.   Specialty: Home Health Services Why: Agency will provide home health physical therapy. Contact information: [address] [place] [address] [phone]  Signed: Alta Corning 10/21/2020, 7:34 AM

## 2020-10-24 DIAGNOSIS — I10 Essential (primary) hypertension: Secondary | ICD-10-CM | POA: Diagnosis not present

## 2020-10-24 DIAGNOSIS — K219 Gastro-esophageal reflux disease without esophagitis: Secondary | ICD-10-CM | POA: Diagnosis not present

## 2020-10-24 DIAGNOSIS — Z7982 Long term (current) use of aspirin: Secondary | ICD-10-CM | POA: Diagnosis not present

## 2020-10-24 DIAGNOSIS — F32A Depression, unspecified: Secondary | ICD-10-CM | POA: Diagnosis not present

## 2020-10-24 DIAGNOSIS — J45909 Unspecified asthma, uncomplicated: Secondary | ICD-10-CM | POA: Diagnosis not present

## 2020-10-24 DIAGNOSIS — E785 Hyperlipidemia, unspecified: Secondary | ICD-10-CM | POA: Diagnosis not present

## 2020-10-24 DIAGNOSIS — Z7984 Long term (current) use of oral hypoglycemic drugs: Secondary | ICD-10-CM | POA: Diagnosis not present

## 2020-10-24 DIAGNOSIS — E1140 Type 2 diabetes mellitus with diabetic neuropathy, unspecified: Secondary | ICD-10-CM | POA: Diagnosis not present

## 2020-10-24 DIAGNOSIS — Z471 Aftercare following joint replacement surgery: Secondary | ICD-10-CM | POA: Diagnosis not present

## 2020-10-24 DIAGNOSIS — K649 Unspecified hemorrhoids: Secondary | ICD-10-CM | POA: Diagnosis not present

## 2020-10-24 DIAGNOSIS — Z96651 Presence of right artificial knee joint: Secondary | ICD-10-CM | POA: Diagnosis not present

## 2020-10-24 DIAGNOSIS — G4733 Obstructive sleep apnea (adult) (pediatric): Secondary | ICD-10-CM | POA: Diagnosis not present

## 2020-10-24 DIAGNOSIS — Z791 Long term (current) use of non-steroidal anti-inflammatories (NSAID): Secondary | ICD-10-CM | POA: Diagnosis not present

## 2020-10-25 DIAGNOSIS — J45909 Unspecified asthma, uncomplicated: Secondary | ICD-10-CM | POA: Diagnosis not present

## 2020-10-25 DIAGNOSIS — I10 Essential (primary) hypertension: Secondary | ICD-10-CM | POA: Diagnosis not present

## 2020-10-25 DIAGNOSIS — K649 Unspecified hemorrhoids: Secondary | ICD-10-CM | POA: Diagnosis not present

## 2020-10-25 DIAGNOSIS — G4733 Obstructive sleep apnea (adult) (pediatric): Secondary | ICD-10-CM | POA: Diagnosis not present

## 2020-10-25 DIAGNOSIS — Z96651 Presence of right artificial knee joint: Secondary | ICD-10-CM | POA: Diagnosis not present

## 2020-10-25 DIAGNOSIS — F32A Depression, unspecified: Secondary | ICD-10-CM | POA: Diagnosis not present

## 2020-10-25 DIAGNOSIS — Z7982 Long term (current) use of aspirin: Secondary | ICD-10-CM | POA: Diagnosis not present

## 2020-10-25 DIAGNOSIS — E1140 Type 2 diabetes mellitus with diabetic neuropathy, unspecified: Secondary | ICD-10-CM | POA: Diagnosis not present

## 2020-10-25 DIAGNOSIS — Z791 Long term (current) use of non-steroidal anti-inflammatories (NSAID): Secondary | ICD-10-CM | POA: Diagnosis not present

## 2020-10-25 DIAGNOSIS — Z7984 Long term (current) use of oral hypoglycemic drugs: Secondary | ICD-10-CM | POA: Diagnosis not present

## 2020-10-25 DIAGNOSIS — K219 Gastro-esophageal reflux disease without esophagitis: Secondary | ICD-10-CM | POA: Diagnosis not present

## 2020-10-25 DIAGNOSIS — Z471 Aftercare following joint replacement surgery: Secondary | ICD-10-CM | POA: Diagnosis not present

## 2020-10-25 DIAGNOSIS — E785 Hyperlipidemia, unspecified: Secondary | ICD-10-CM | POA: Diagnosis not present

## 2020-10-28 DIAGNOSIS — K219 Gastro-esophageal reflux disease without esophagitis: Secondary | ICD-10-CM | POA: Diagnosis not present

## 2020-10-28 DIAGNOSIS — Z471 Aftercare following joint replacement surgery: Secondary | ICD-10-CM | POA: Diagnosis not present

## 2020-10-28 DIAGNOSIS — Z7984 Long term (current) use of oral hypoglycemic drugs: Secondary | ICD-10-CM | POA: Diagnosis not present

## 2020-10-28 DIAGNOSIS — F32A Depression, unspecified: Secondary | ICD-10-CM | POA: Diagnosis not present

## 2020-10-28 DIAGNOSIS — K649 Unspecified hemorrhoids: Secondary | ICD-10-CM | POA: Diagnosis not present

## 2020-10-28 DIAGNOSIS — J45909 Unspecified asthma, uncomplicated: Secondary | ICD-10-CM | POA: Diagnosis not present

## 2020-10-28 DIAGNOSIS — E785 Hyperlipidemia, unspecified: Secondary | ICD-10-CM | POA: Diagnosis not present

## 2020-10-28 DIAGNOSIS — E1140 Type 2 diabetes mellitus with diabetic neuropathy, unspecified: Secondary | ICD-10-CM | POA: Diagnosis not present

## 2020-10-28 DIAGNOSIS — G4733 Obstructive sleep apnea (adult) (pediatric): Secondary | ICD-10-CM | POA: Diagnosis not present

## 2020-10-28 DIAGNOSIS — Z7982 Long term (current) use of aspirin: Secondary | ICD-10-CM | POA: Diagnosis not present

## 2020-10-28 DIAGNOSIS — I10 Essential (primary) hypertension: Secondary | ICD-10-CM | POA: Diagnosis not present

## 2020-10-28 DIAGNOSIS — Z791 Long term (current) use of non-steroidal anti-inflammatories (NSAID): Secondary | ICD-10-CM | POA: Diagnosis not present

## 2020-10-28 DIAGNOSIS — Z96651 Presence of right artificial knee joint: Secondary | ICD-10-CM | POA: Diagnosis not present

## 2020-10-30 DIAGNOSIS — Z9889 Other specified postprocedural states: Secondary | ICD-10-CM | POA: Diagnosis not present

## 2020-10-31 DIAGNOSIS — F32A Depression, unspecified: Secondary | ICD-10-CM | POA: Diagnosis not present

## 2020-10-31 DIAGNOSIS — Z96651 Presence of right artificial knee joint: Secondary | ICD-10-CM | POA: Diagnosis not present

## 2020-10-31 DIAGNOSIS — Z7982 Long term (current) use of aspirin: Secondary | ICD-10-CM | POA: Diagnosis not present

## 2020-10-31 DIAGNOSIS — E785 Hyperlipidemia, unspecified: Secondary | ICD-10-CM | POA: Diagnosis not present

## 2020-10-31 DIAGNOSIS — Z791 Long term (current) use of non-steroidal anti-inflammatories (NSAID): Secondary | ICD-10-CM | POA: Diagnosis not present

## 2020-10-31 DIAGNOSIS — J45909 Unspecified asthma, uncomplicated: Secondary | ICD-10-CM | POA: Diagnosis not present

## 2020-10-31 DIAGNOSIS — K649 Unspecified hemorrhoids: Secondary | ICD-10-CM | POA: Diagnosis not present

## 2020-10-31 DIAGNOSIS — K219 Gastro-esophageal reflux disease without esophagitis: Secondary | ICD-10-CM | POA: Diagnosis not present

## 2020-10-31 DIAGNOSIS — E1140 Type 2 diabetes mellitus with diabetic neuropathy, unspecified: Secondary | ICD-10-CM | POA: Diagnosis not present

## 2020-10-31 DIAGNOSIS — G4733 Obstructive sleep apnea (adult) (pediatric): Secondary | ICD-10-CM | POA: Diagnosis not present

## 2020-10-31 DIAGNOSIS — I10 Essential (primary) hypertension: Secondary | ICD-10-CM | POA: Diagnosis not present

## 2020-10-31 DIAGNOSIS — Z471 Aftercare following joint replacement surgery: Secondary | ICD-10-CM | POA: Diagnosis not present

## 2020-10-31 DIAGNOSIS — Z7984 Long term (current) use of oral hypoglycemic drugs: Secondary | ICD-10-CM | POA: Diagnosis not present

## 2020-11-03 DIAGNOSIS — M6281 Muscle weakness (generalized): Secondary | ICD-10-CM | POA: Diagnosis not present

## 2020-11-03 DIAGNOSIS — Z96651 Presence of right artificial knee joint: Secondary | ICD-10-CM | POA: Diagnosis not present

## 2020-11-03 DIAGNOSIS — R262 Difficulty in walking, not elsewhere classified: Secondary | ICD-10-CM | POA: Diagnosis not present

## 2020-11-03 DIAGNOSIS — Z9889 Other specified postprocedural states: Secondary | ICD-10-CM | POA: Diagnosis not present

## 2020-11-05 DIAGNOSIS — Z9889 Other specified postprocedural states: Secondary | ICD-10-CM | POA: Diagnosis not present

## 2020-11-05 DIAGNOSIS — K219 Gastro-esophageal reflux disease without esophagitis: Secondary | ICD-10-CM | POA: Diagnosis not present

## 2020-11-05 DIAGNOSIS — R110 Nausea: Secondary | ICD-10-CM | POA: Diagnosis not present

## 2020-11-10 DIAGNOSIS — Z9889 Other specified postprocedural states: Secondary | ICD-10-CM | POA: Diagnosis not present

## 2020-11-10 DIAGNOSIS — M6281 Muscle weakness (generalized): Secondary | ICD-10-CM | POA: Diagnosis not present

## 2020-11-10 DIAGNOSIS — Z96651 Presence of right artificial knee joint: Secondary | ICD-10-CM | POA: Diagnosis not present

## 2020-11-10 DIAGNOSIS — R262 Difficulty in walking, not elsewhere classified: Secondary | ICD-10-CM | POA: Diagnosis not present

## 2020-11-14 DIAGNOSIS — M6281 Muscle weakness (generalized): Secondary | ICD-10-CM | POA: Diagnosis not present

## 2020-11-14 DIAGNOSIS — R262 Difficulty in walking, not elsewhere classified: Secondary | ICD-10-CM | POA: Diagnosis not present

## 2020-11-14 DIAGNOSIS — Z9889 Other specified postprocedural states: Secondary | ICD-10-CM | POA: Diagnosis not present

## 2020-11-14 DIAGNOSIS — Z96651 Presence of right artificial knee joint: Secondary | ICD-10-CM | POA: Diagnosis not present

## 2020-11-17 DIAGNOSIS — R262 Difficulty in walking, not elsewhere classified: Secondary | ICD-10-CM | POA: Diagnosis not present

## 2020-11-17 DIAGNOSIS — M6281 Muscle weakness (generalized): Secondary | ICD-10-CM | POA: Diagnosis not present

## 2020-11-17 DIAGNOSIS — Z9889 Other specified postprocedural states: Secondary | ICD-10-CM | POA: Diagnosis not present

## 2020-11-17 DIAGNOSIS — Z96651 Presence of right artificial knee joint: Secondary | ICD-10-CM | POA: Diagnosis not present

## 2020-11-24 DIAGNOSIS — M6281 Muscle weakness (generalized): Secondary | ICD-10-CM | POA: Diagnosis not present

## 2020-11-24 DIAGNOSIS — R262 Difficulty in walking, not elsewhere classified: Secondary | ICD-10-CM | POA: Diagnosis not present

## 2020-11-24 DIAGNOSIS — Z96651 Presence of right artificial knee joint: Secondary | ICD-10-CM | POA: Diagnosis not present

## 2020-11-24 DIAGNOSIS — Z9889 Other specified postprocedural states: Secondary | ICD-10-CM | POA: Diagnosis not present

## 2020-11-27 DIAGNOSIS — M25562 Pain in left knee: Secondary | ICD-10-CM | POA: Diagnosis not present

## 2020-11-27 DIAGNOSIS — M1712 Unilateral primary osteoarthritis, left knee: Secondary | ICD-10-CM | POA: Diagnosis not present

## 2020-11-28 DIAGNOSIS — Z96651 Presence of right artificial knee joint: Secondary | ICD-10-CM | POA: Diagnosis not present

## 2020-11-28 DIAGNOSIS — R262 Difficulty in walking, not elsewhere classified: Secondary | ICD-10-CM | POA: Diagnosis not present

## 2020-11-28 DIAGNOSIS — Z9889 Other specified postprocedural states: Secondary | ICD-10-CM | POA: Diagnosis not present

## 2020-11-28 DIAGNOSIS — M6281 Muscle weakness (generalized): Secondary | ICD-10-CM | POA: Diagnosis not present

## 2020-12-01 DIAGNOSIS — Z96651 Presence of right artificial knee joint: Secondary | ICD-10-CM | POA: Diagnosis not present

## 2020-12-01 DIAGNOSIS — M6281 Muscle weakness (generalized): Secondary | ICD-10-CM | POA: Diagnosis not present

## 2020-12-01 DIAGNOSIS — Z9889 Other specified postprocedural states: Secondary | ICD-10-CM | POA: Diagnosis not present

## 2020-12-01 DIAGNOSIS — R262 Difficulty in walking, not elsewhere classified: Secondary | ICD-10-CM | POA: Diagnosis not present

## 2020-12-05 DIAGNOSIS — Z9889 Other specified postprocedural states: Secondary | ICD-10-CM | POA: Diagnosis not present

## 2020-12-05 DIAGNOSIS — R262 Difficulty in walking, not elsewhere classified: Secondary | ICD-10-CM | POA: Diagnosis not present

## 2020-12-05 DIAGNOSIS — Z96651 Presence of right artificial knee joint: Secondary | ICD-10-CM | POA: Diagnosis not present

## 2020-12-05 DIAGNOSIS — M6281 Muscle weakness (generalized): Secondary | ICD-10-CM | POA: Diagnosis not present

## 2020-12-08 DIAGNOSIS — M6281 Muscle weakness (generalized): Secondary | ICD-10-CM | POA: Diagnosis not present

## 2020-12-08 DIAGNOSIS — Z96651 Presence of right artificial knee joint: Secondary | ICD-10-CM | POA: Diagnosis not present

## 2020-12-08 DIAGNOSIS — Z9889 Other specified postprocedural states: Secondary | ICD-10-CM | POA: Diagnosis not present

## 2020-12-08 DIAGNOSIS — R262 Difficulty in walking, not elsewhere classified: Secondary | ICD-10-CM | POA: Diagnosis not present

## 2020-12-12 DIAGNOSIS — Z96651 Presence of right artificial knee joint: Secondary | ICD-10-CM | POA: Diagnosis not present

## 2020-12-12 DIAGNOSIS — R262 Difficulty in walking, not elsewhere classified: Secondary | ICD-10-CM | POA: Diagnosis not present

## 2020-12-12 DIAGNOSIS — M6281 Muscle weakness (generalized): Secondary | ICD-10-CM | POA: Diagnosis not present

## 2020-12-12 DIAGNOSIS — Z9889 Other specified postprocedural states: Secondary | ICD-10-CM | POA: Diagnosis not present

## 2020-12-17 DIAGNOSIS — Z96651 Presence of right artificial knee joint: Secondary | ICD-10-CM | POA: Diagnosis not present

## 2020-12-17 DIAGNOSIS — M6281 Muscle weakness (generalized): Secondary | ICD-10-CM | POA: Diagnosis not present

## 2020-12-17 DIAGNOSIS — R262 Difficulty in walking, not elsewhere classified: Secondary | ICD-10-CM | POA: Diagnosis not present

## 2020-12-17 DIAGNOSIS — Z9889 Other specified postprocedural states: Secondary | ICD-10-CM | POA: Diagnosis not present

## 2020-12-22 DIAGNOSIS — Z9889 Other specified postprocedural states: Secondary | ICD-10-CM | POA: Diagnosis not present

## 2020-12-22 DIAGNOSIS — M6281 Muscle weakness (generalized): Secondary | ICD-10-CM | POA: Diagnosis not present

## 2020-12-22 DIAGNOSIS — R262 Difficulty in walking, not elsewhere classified: Secondary | ICD-10-CM | POA: Diagnosis not present

## 2020-12-22 DIAGNOSIS — Z96651 Presence of right artificial knee joint: Secondary | ICD-10-CM | POA: Diagnosis not present

## 2020-12-26 DIAGNOSIS — Z96651 Presence of right artificial knee joint: Secondary | ICD-10-CM | POA: Diagnosis not present

## 2020-12-26 DIAGNOSIS — M6281 Muscle weakness (generalized): Secondary | ICD-10-CM | POA: Diagnosis not present

## 2020-12-26 DIAGNOSIS — R262 Difficulty in walking, not elsewhere classified: Secondary | ICD-10-CM | POA: Diagnosis not present

## 2020-12-26 DIAGNOSIS — Z9889 Other specified postprocedural states: Secondary | ICD-10-CM | POA: Diagnosis not present

## 2020-12-29 DIAGNOSIS — Z96651 Presence of right artificial knee joint: Secondary | ICD-10-CM | POA: Diagnosis not present

## 2020-12-29 DIAGNOSIS — R262 Difficulty in walking, not elsewhere classified: Secondary | ICD-10-CM | POA: Diagnosis not present

## 2020-12-29 DIAGNOSIS — Z9889 Other specified postprocedural states: Secondary | ICD-10-CM | POA: Diagnosis not present

## 2020-12-29 DIAGNOSIS — M6281 Muscle weakness (generalized): Secondary | ICD-10-CM | POA: Diagnosis not present

## 2021-01-05 DIAGNOSIS — Z9889 Other specified postprocedural states: Secondary | ICD-10-CM | POA: Diagnosis not present

## 2021-01-05 DIAGNOSIS — M6281 Muscle weakness (generalized): Secondary | ICD-10-CM | POA: Diagnosis not present

## 2021-01-05 DIAGNOSIS — Z96651 Presence of right artificial knee joint: Secondary | ICD-10-CM | POA: Diagnosis not present

## 2021-01-05 DIAGNOSIS — R262 Difficulty in walking, not elsewhere classified: Secondary | ICD-10-CM | POA: Diagnosis not present

## 2021-01-06 DIAGNOSIS — Z9889 Other specified postprocedural states: Secondary | ICD-10-CM | POA: Diagnosis not present

## 2021-02-03 DIAGNOSIS — Z96651 Presence of right artificial knee joint: Secondary | ICD-10-CM | POA: Diagnosis not present

## 2021-02-03 DIAGNOSIS — M25561 Pain in right knee: Secondary | ICD-10-CM | POA: Diagnosis not present

## 2021-02-16 DIAGNOSIS — E1151 Type 2 diabetes mellitus with diabetic peripheral angiopathy without gangrene: Secondary | ICD-10-CM | POA: Diagnosis not present

## 2021-02-16 DIAGNOSIS — E782 Mixed hyperlipidemia: Secondary | ICD-10-CM | POA: Diagnosis not present

## 2021-02-16 DIAGNOSIS — K760 Fatty (change of) liver, not elsewhere classified: Secondary | ICD-10-CM | POA: Diagnosis not present

## 2021-02-16 DIAGNOSIS — K219 Gastro-esophageal reflux disease without esophagitis: Secondary | ICD-10-CM | POA: Diagnosis not present

## 2021-02-16 DIAGNOSIS — I10 Essential (primary) hypertension: Secondary | ICD-10-CM | POA: Diagnosis not present

## 2021-02-16 DIAGNOSIS — M170 Bilateral primary osteoarthritis of knee: Secondary | ICD-10-CM | POA: Diagnosis not present

## 2021-02-16 DIAGNOSIS — L719 Rosacea, unspecified: Secondary | ICD-10-CM | POA: Diagnosis not present

## 2021-02-16 DIAGNOSIS — I739 Peripheral vascular disease, unspecified: Secondary | ICD-10-CM | POA: Diagnosis not present

## 2021-02-16 DIAGNOSIS — G4733 Obstructive sleep apnea (adult) (pediatric): Secondary | ICD-10-CM | POA: Diagnosis not present

## 2021-03-10 DIAGNOSIS — M5441 Lumbago with sciatica, right side: Secondary | ICD-10-CM | POA: Diagnosis not present

## 2021-03-10 DIAGNOSIS — Z96651 Presence of right artificial knee joint: Secondary | ICD-10-CM | POA: Diagnosis not present

## 2021-03-10 DIAGNOSIS — Z471 Aftercare following joint replacement surgery: Secondary | ICD-10-CM | POA: Diagnosis not present

## 2021-03-10 DIAGNOSIS — M545 Low back pain, unspecified: Secondary | ICD-10-CM | POA: Diagnosis not present

## 2021-03-12 ENCOUNTER — Other Ambulatory Visit: Payer: Self-pay | Admitting: Orthopedic Surgery

## 2021-03-12 DIAGNOSIS — M545 Low back pain, unspecified: Secondary | ICD-10-CM

## 2021-04-01 ENCOUNTER — Other Ambulatory Visit: Payer: Medicare Other

## 2021-04-10 ENCOUNTER — Ambulatory Visit
Admission: RE | Admit: 2021-04-10 | Discharge: 2021-04-10 | Disposition: A | Discharge status: Home / Self Care | Payer: Medicare Other | Source: Ambulatory Visit | Attending: Orthopedic Surgery | Admitting: Orthopedic Surgery

## 2021-04-10 DIAGNOSIS — M48061 Spinal stenosis, lumbar region without neurogenic claudication: Secondary | ICD-10-CM | POA: Diagnosis not present

## 2021-04-10 DIAGNOSIS — M545 Low back pain, unspecified: Secondary | ICD-10-CM

## 2021-04-24 ENCOUNTER — Other Ambulatory Visit: Payer: Self-pay | Admitting: Family Medicine

## 2021-04-24 DIAGNOSIS — Z1231 Encounter for screening mammogram for malignant neoplasm of breast: Secondary | ICD-10-CM

## 2021-05-08 DIAGNOSIS — M47816 Spondylosis without myelopathy or radiculopathy, lumbar region: Secondary | ICD-10-CM | POA: Diagnosis not present

## 2021-05-18 DIAGNOSIS — M25561 Pain in right knee: Secondary | ICD-10-CM | POA: Diagnosis not present

## 2021-05-18 DIAGNOSIS — M1712 Unilateral primary osteoarthritis, left knee: Secondary | ICD-10-CM | POA: Diagnosis not present

## 2021-05-18 DIAGNOSIS — M25562 Pain in left knee: Secondary | ICD-10-CM | POA: Diagnosis not present

## 2021-05-18 DIAGNOSIS — M1711 Unilateral primary osteoarthritis, right knee: Secondary | ICD-10-CM | POA: Diagnosis not present

## 2021-06-16 ENCOUNTER — Ambulatory Visit: Payer: Medicare Other

## 2021-07-13 DIAGNOSIS — Z96651 Presence of right artificial knee joint: Secondary | ICD-10-CM | POA: Diagnosis not present

## 2021-07-13 DIAGNOSIS — M1712 Unilateral primary osteoarthritis, left knee: Secondary | ICD-10-CM | POA: Diagnosis not present

## 2021-07-14 DIAGNOSIS — M79602 Pain in left arm: Secondary | ICD-10-CM | POA: Diagnosis not present

## 2021-07-14 DIAGNOSIS — Z23 Encounter for immunization: Secondary | ICD-10-CM | POA: Diagnosis not present

## 2021-07-14 DIAGNOSIS — B351 Tinea unguium: Secondary | ICD-10-CM | POA: Diagnosis not present

## 2021-07-14 DIAGNOSIS — H938X2 Other specified disorders of left ear: Secondary | ICD-10-CM | POA: Diagnosis not present

## 2021-07-20 DIAGNOSIS — I10 Essential (primary) hypertension: Secondary | ICD-10-CM | POA: Diagnosis not present

## 2021-07-20 DIAGNOSIS — K219 Gastro-esophageal reflux disease without esophagitis: Secondary | ICD-10-CM | POA: Diagnosis not present

## 2021-07-20 DIAGNOSIS — E782 Mixed hyperlipidemia: Secondary | ICD-10-CM | POA: Diagnosis not present

## 2021-07-20 DIAGNOSIS — I739 Peripheral vascular disease, unspecified: Secondary | ICD-10-CM | POA: Diagnosis not present

## 2021-07-20 DIAGNOSIS — E1151 Type 2 diabetes mellitus with diabetic peripheral angiopathy without gangrene: Secondary | ICD-10-CM | POA: Diagnosis not present

## 2021-07-20 DIAGNOSIS — G4733 Obstructive sleep apnea (adult) (pediatric): Secondary | ICD-10-CM | POA: Diagnosis not present

## 2021-07-20 DIAGNOSIS — Z0000 Encounter for general adult medical examination without abnormal findings: Secondary | ICD-10-CM | POA: Diagnosis not present

## 2021-07-20 DIAGNOSIS — K760 Fatty (change of) liver, not elsewhere classified: Secondary | ICD-10-CM | POA: Diagnosis not present

## 2021-07-20 DIAGNOSIS — M170 Bilateral primary osteoarthritis of knee: Secondary | ICD-10-CM | POA: Diagnosis not present

## 2021-07-20 DIAGNOSIS — N3020 Other chronic cystitis without hematuria: Secondary | ICD-10-CM | POA: Diagnosis not present

## 2021-08-08 DIAGNOSIS — R1084 Generalized abdominal pain: Secondary | ICD-10-CM | POA: Diagnosis not present

## 2021-08-08 DIAGNOSIS — K5900 Constipation, unspecified: Secondary | ICD-10-CM | POA: Diagnosis not present

## 2021-09-01 DIAGNOSIS — M67912 Unspecified disorder of synovium and tendon, left shoulder: Secondary | ICD-10-CM | POA: Diagnosis not present

## 2021-09-17 ENCOUNTER — Other Ambulatory Visit: Payer: Self-pay | Admitting: Family Medicine

## 2021-09-17 DIAGNOSIS — N644 Mastodynia: Secondary | ICD-10-CM

## 2021-09-30 DIAGNOSIS — M7502 Adhesive capsulitis of left shoulder: Secondary | ICD-10-CM | POA: Diagnosis not present

## 2021-09-30 DIAGNOSIS — M67912 Unspecified disorder of synovium and tendon, left shoulder: Secondary | ICD-10-CM | POA: Diagnosis not present

## 2021-10-05 DIAGNOSIS — E782 Mixed hyperlipidemia: Secondary | ICD-10-CM | POA: Diagnosis not present

## 2021-10-09 DIAGNOSIS — M6281 Muscle weakness (generalized): Secondary | ICD-10-CM | POA: Diagnosis not present

## 2021-10-09 DIAGNOSIS — M7502 Adhesive capsulitis of left shoulder: Secondary | ICD-10-CM | POA: Diagnosis not present

## 2021-10-09 DIAGNOSIS — M7542 Impingement syndrome of left shoulder: Secondary | ICD-10-CM | POA: Diagnosis not present

## 2021-10-15 DIAGNOSIS — M25512 Pain in left shoulder: Secondary | ICD-10-CM | POA: Diagnosis not present

## 2021-10-15 DIAGNOSIS — M67912 Unspecified disorder of synovium and tendon, left shoulder: Secondary | ICD-10-CM | POA: Diagnosis not present

## 2021-10-22 ENCOUNTER — Other Ambulatory Visit: Payer: Self-pay | Admitting: Orthopedic Surgery

## 2021-10-22 DIAGNOSIS — M25512 Pain in left shoulder: Secondary | ICD-10-CM

## 2021-10-29 DIAGNOSIS — M1712 Unilateral primary osteoarthritis, left knee: Secondary | ICD-10-CM | POA: Diagnosis not present

## 2021-11-02 ENCOUNTER — Other Ambulatory Visit: Payer: Self-pay

## 2021-11-02 ENCOUNTER — Ambulatory Visit: Payer: Medicare Other

## 2021-11-02 ENCOUNTER — Ambulatory Visit
Admission: RE | Admit: 2021-11-02 | Discharge: 2021-11-02 | Disposition: A | Discharge status: Home / Self Care | Payer: Medicare Other | Source: Ambulatory Visit | Attending: Family Medicine | Admitting: Family Medicine

## 2021-11-02 DIAGNOSIS — N644 Mastodynia: Secondary | ICD-10-CM | POA: Diagnosis not present

## 2021-11-02 DIAGNOSIS — R922 Inconclusive mammogram: Secondary | ICD-10-CM | POA: Diagnosis not present

## 2021-11-06 ENCOUNTER — Ambulatory Visit
Admission: RE | Admit: 2021-11-06 | Discharge: 2021-11-06 | Disposition: A | Discharge status: Home / Self Care | Payer: Medicare Other | Source: Ambulatory Visit | Attending: Orthopedic Surgery | Admitting: Orthopedic Surgery

## 2021-11-06 DIAGNOSIS — M75122 Complete rotator cuff tear or rupture of left shoulder, not specified as traumatic: Secondary | ICD-10-CM | POA: Diagnosis not present

## 2021-11-06 DIAGNOSIS — M25512 Pain in left shoulder: Secondary | ICD-10-CM

## 2021-11-10 DIAGNOSIS — M25562 Pain in left knee: Secondary | ICD-10-CM | POA: Diagnosis not present

## 2021-11-10 DIAGNOSIS — M67912 Unspecified disorder of synovium and tendon, left shoulder: Secondary | ICD-10-CM | POA: Diagnosis not present

## 2021-11-25 DIAGNOSIS — L570 Actinic keratosis: Secondary | ICD-10-CM | POA: Diagnosis not present

## 2021-11-25 DIAGNOSIS — L820 Inflamed seborrheic keratosis: Secondary | ICD-10-CM | POA: Diagnosis not present

## 2021-11-25 DIAGNOSIS — L814 Other melanin hyperpigmentation: Secondary | ICD-10-CM | POA: Diagnosis not present

## 2021-11-25 DIAGNOSIS — Z85828 Personal history of other malignant neoplasm of skin: Secondary | ICD-10-CM | POA: Diagnosis not present

## 2021-11-25 DIAGNOSIS — D485 Neoplasm of uncertain behavior of skin: Secondary | ICD-10-CM | POA: Diagnosis not present

## 2021-11-25 DIAGNOSIS — L304 Erythema intertrigo: Secondary | ICD-10-CM | POA: Diagnosis not present

## 2021-11-25 DIAGNOSIS — L821 Other seborrheic keratosis: Secondary | ICD-10-CM | POA: Diagnosis not present

## 2021-12-01 DIAGNOSIS — M24812 Other specific joint derangements of left shoulder, not elsewhere classified: Secondary | ICD-10-CM | POA: Diagnosis not present

## 2021-12-01 DIAGNOSIS — M75122 Complete rotator cuff tear or rupture of left shoulder, not specified as traumatic: Secondary | ICD-10-CM | POA: Diagnosis not present

## 2021-12-22 DIAGNOSIS — M1712 Unilateral primary osteoarthritis, left knee: Secondary | ICD-10-CM | POA: Diagnosis not present

## 2021-12-22 DIAGNOSIS — M25562 Pain in left knee: Secondary | ICD-10-CM | POA: Diagnosis not present

## 2021-12-22 DIAGNOSIS — M25512 Pain in left shoulder: Secondary | ICD-10-CM | POA: Diagnosis not present

## 2021-12-22 DIAGNOSIS — M67912 Unspecified disorder of synovium and tendon, left shoulder: Secondary | ICD-10-CM | POA: Diagnosis not present

## 2022-01-11 ENCOUNTER — Other Ambulatory Visit: Payer: Self-pay | Admitting: Orthopedic Surgery

## 2022-01-20 DIAGNOSIS — I739 Peripheral vascular disease, unspecified: Secondary | ICD-10-CM | POA: Diagnosis not present

## 2022-01-20 DIAGNOSIS — M170 Bilateral primary osteoarthritis of knee: Secondary | ICD-10-CM | POA: Diagnosis not present

## 2022-01-20 DIAGNOSIS — K760 Fatty (change of) liver, not elsewhere classified: Secondary | ICD-10-CM | POA: Diagnosis not present

## 2022-01-20 DIAGNOSIS — K219 Gastro-esophageal reflux disease without esophagitis: Secondary | ICD-10-CM | POA: Diagnosis not present

## 2022-01-20 DIAGNOSIS — I10 Essential (primary) hypertension: Secondary | ICD-10-CM | POA: Diagnosis not present

## 2022-01-20 DIAGNOSIS — E782 Mixed hyperlipidemia: Secondary | ICD-10-CM | POA: Diagnosis not present

## 2022-01-20 DIAGNOSIS — N3020 Other chronic cystitis without hematuria: Secondary | ICD-10-CM | POA: Diagnosis not present

## 2022-01-20 DIAGNOSIS — G4733 Obstructive sleep apnea (adult) (pediatric): Secondary | ICD-10-CM | POA: Diagnosis not present

## 2022-01-20 DIAGNOSIS — E1151 Type 2 diabetes mellitus with diabetic peripheral angiopathy without gangrene: Secondary | ICD-10-CM | POA: Diagnosis not present

## 2022-01-20 NOTE — Care Plan (Signed)
Ortho Bundle Case Management Note ? ?Patient Details  ?Name: Candice Reid ?MRN: [id number] ?Date of Birth: 07/31/1941 ? ? Spoke with patient prior to surgery. She will discharge to home with family to assist. Rolling walker and CPM ordered. HHPT referral to [facility] and oPPT set up with [facility]. Patient and MD in agreement with plan. Choice offered               ? ? ? ?DME Arranged:  Walker rolling, CPM ?DME Agency:  [facility] ? ?HH Arranged:  PT ?[place] Agency:  [facility] ? ?Additional Comments: ?Please contact me with any questions of if this plan should need to change. ? ?Mardelle Matte  [facility] Orthopaedic Specialist  [phone] ?01/20/2022, 11:52 AM ?  ?

## 2022-01-21 ENCOUNTER — Other Ambulatory Visit: Payer: Self-pay | Admitting: Family Medicine

## 2022-01-21 DIAGNOSIS — M8580 Other specified disorders of bone density and structure, unspecified site: Secondary | ICD-10-CM

## 2022-01-21 NOTE — Patient Instructions (Addendum)
DUE TO COVID-19 ONLY TWO VISITORS  (aged 80 and older)  IS ALLOWED TO COME WITH YOU AND STAY IN THE WAITING ROOM ONLY DURING PRE OP AND PROCEDURE.   ?**NO VISITORS ARE ALLOWED IN THE SHORT STAY AREA OR RECOVERY ROOM!!** ? ?IF YOU WILL BE ADMITTED INTO THE HOSPITAL YOU ARE ALLOWED ONLY FOUR SUPPORT PEOPLE DURING VISITATION HOURS ONLY (7 AM -8PM)   ?The support person(s) must pass our screening, gel in and out ?Visitors GUEST BADGE MUST BE WORN VISIBLY  ?One adult visitor may remain with you overnight and MUST be in the room by 8 P.M.  ? ?You are not required to quarantine ?Hand Hygiene often ?Do NOT share personal items ?Notify your provider if you are in close contact with someone who has COVID or you develop fever 100.4 or greater, new onset of sneezing, cough, sore throat, shortness of breath or body aches. ? ?     ? Your procedure is scheduled on:  01-29-22 ? ? Report to [facility] Main Entrance ? ?  Report to admitting at 5:15 AM ? ? Call this number if you have problems the morning of surgery [phone] ? ? Do not eat food :After Midnight. ? ? After Midnight you may have the following liquids until 4:30 AM DAY OF SURGERY ? ?Water ?Black Coffee (sugar ok, NO MILK/CREAM OR CREAMERS)  ?Tea (sugar ok, NO MILK/CREAM OR CREAMERS) regular and decaf                             ?Plain Jell-O (NO RED)                                           ?Fruit ices (not with fruit pulp, NO RED)                                     ?Popsicles (NO RED)                                                                  ?Juice: apple, WHITE grape, WHITE cranberry ?Sports drinks like Gatorade (NO RED) ?Clear broth(vegetable,chicken,beef) ?            ? ?FOLLOW ANY ADDITIONAL PRE OP INSTRUCTIONS YOU RECEIVED FROM YOUR SURGEON'S OFFICE!!! ?  ?  ?Oral Hygiene is also important to reduce your risk of infection.                                    ?Remember - BRUSH YOUR TEETH THE MORNING OF SURGERY WITH YOUR REGULAR TOOTHPASTE ? ? Do  NOT smoke after Midnight ? ? Take these medicines the morning of surgery with A SIP OF WATER: Tylenol, Alprazolam, Duloxetine, Metoprolol, Omeprazole, Pravastatin ? ?How to Manage Your Diabetes ?Before and After Surgery ? ?Why is it important to control my blood sugar before and after surgery? ?Improving blood sugar levels before and after surgery helps healing and can limit problems. ?  A way of improving blood sugar control is eating a healthy diet by: ? Eating less sugar and carbohydrates ? Increasing activity/exercise ? Talking with your doctor about reaching your blood sugar goals ?High blood sugars (greater than 180 mg/dL) can raise your risk of infections and slow your recovery, so you will need to focus on controlling your diabetes during the weeks before surgery. ?Make sure that the doctor who takes care of your diabetes knows about your planned surgery including the date and location. ? ?How do I manage my blood sugar before surgery? ?Check your blood sugar at least 4 times a day, starting 2 days before surgery, to make sure that the level is not too high or low. ?Check your blood sugar the morning of your surgery when you wake up and every 2 hours until you get to the Short Stay unit. ?If your blood sugar is less than 70 mg/dL, you will need to treat for low blood sugar: ?Do not take insulin. ?Treat a low blood sugar (less than 70 mg/dL) with ? cup of clear juice (cranberry or apple), 4 glucose tablets, OR glucose gel. ?Recheck blood sugar in 15 minutes after treatment (to make sure it is greater than 70 mg/dL). If your blood sugar is not greater than 70 mg/dL on recheck, call [phone] for further instructions. ?Report your blood sugar to the short stay nurse when you get to Short Stay. ? ?If you are admitted to the hospital after surgery: ?Your blood sugar will be checked by the staff and you will probably be given insulin after surgery (instead of oral diabetes medicines) to make sure you have good  blood sugar levels. ?The goal for blood sugar control after surgery is 80-180 mg/dL. ? ? ?WHAT DO I DO ABOUT MY DIABETES MEDICATION? ? ?Do not take oral diabetes medicines (pills) the morning of surgery. ? ?THE DAY BEFORE SURGERY:  Take Januvia as prescribed ? ?THE MORNING OF SURGERY:  Do not take Januvia. ? ?The day of surgery, do not take other diabetes injectables, including Byetta (exenatide), Bydureon (exenatide ER), Victoza (liraglutide), or Trulicity (dulaglutide). ? ?Reviewed and Endorsed by [facility] Patient Education Committee, August 2015  ?                  ?           You may not have any metal on your body including hair pins, jewelry, and body piercing ? ?           Do not wear make-up, lotions, powders, perfumes or deodorant ? ?Do not wear nail polish including gel and S&S, artificial/acrylic nails, or any other type of covering on natural nails including finger and toenails. If you have artificial nails, gel coating, etc. that needs to be removed by a nail salon please have this removed prior to surgery or surgery may need to be canceled/ delayed if the surgeon/ anesthesia feels like they are unable to be safely monitored.  ? ?Do not shave  48 hours prior to surgery.  ? ?     Do not bring valuables to the hospital. [place]. ? ? Contacts, dentures or bridgework may not be worn into surgery. ?  ?Patients discharged on the day of surgery will not be allowed to drive home.  Someone NEEDS to stay with you for the first 24 hours after anesthesia. ? ?Special Instructions: Bring a copy of your healthcare power of attorney and living  will documents the day of surgery if you haven't scanned them before. ? ?Please read over the following fact sheets you were given: IF [place]  ? ?[place] - Preparing for Surgery ?Before surgery, you can play an important role.  Because skin is not sterile, your  skin needs to be as free of germs as possible.  You can reduce the number of germs on your skin by washing with CHG (chlorahexidine gluconate) soap before surgery.  CHG is an antiseptic cleaner which kills germs and bonds with the skin to continue killing germs even after washing. ?Please DO NOT use if you have an allergy to CHG or antibacterial soaps.  If your skin becomes reddened/irritated stop using the CHG and inform your nurse when you arrive at Short Stay. ?Do not shave (including legs and underarms) for at least 48 hours prior to the first CHG shower.  You may shave your face/neck. ? ?Please follow these instructions carefully: ? 1.  Shower with CHG Soap the night before surgery and the  morning of surgery. ? 2.  If you choose to wash your hair, wash your hair first as usual with your normal  shampoo. ? 3.  After you shampoo, rinse your hair and body thoroughly to remove the shampoo.                            ? 4.  Use CHG as you would any other liquid soap.  You can apply chg directly to the skin and wash.  Gently with a scrungie or clean washcloth. ? 5.  Apply the CHG Soap to your body ONLY FROM THE NECK DOWN.   Do   not use on face/ open      ?                     Wound or open sores. Avoid contact with eyes, ears mouth and   genitals (private parts).  ?                     Production manager,  Genitals (private parts) with your normal soap. ?            6.  Wash thoroughly, paying special attention to the area where your    surgery  will be performed. ? 7.  Thoroughly rinse your body with warm water from the neck down. ? 8.  DO NOT shower/wash with your normal soap after using and rinsing off the CHG Soap. ?               9.  Pat yourself dry with a clean towel. ?           10.  Wear clean pajamas. ?           11.  Place clean sheets on your bed the night of your first shower and do not  sleep with pets. ?Day of Surgery : ?Do not apply any lotions/deodorants the morning of surgery.  Please wear clean clothes to  the hospital/surgery center. ? ?FAILURE TO FOLLOW THESE INSTRUCTIONS MAY RESULT IN THE CANCELLATION OF YOUR SURGERY ? ?PATIENT SIGNATURE_________________________________ ? ?NURSE SIGNATURE________________

## 2022-01-21 NOTE — Progress Notes (Addendum)
COVID Vaccine Completed:  Yes x2 ?Date COVID Vaccine completed: ?Has received booster:  Yes x1 ?COVID vaccine manufacturer: Pfizer     ? ?Date of COVID positive in last 90 days: No ? ?PCP - Maury Dus, MD (office note requested) ?Cardiologist - Gayla Doss, MD ? ?Chest x-ray - 01-26-22 Epic ?EKG - 01-26-22 Epic ?Stress Test - N/A ?ECHO - greater than 2 years Epic ?Cardiac Cath - N/A ?Pacemaker/ICD device last checked: ?Spinal Cord Stimulator: ?Telemetry Monitoring - 2021 Epic ? ?Bowel Prep - N/A ? ?Sleep Study - Yes, +sleep apnea ?CPAP - No longer uses, last use 6 months ? ?Fasting Blood Sugar - 110 to 120 ?Checks Blood Sugar - checks occasionally ? ?Blood Thinner Instructions:  N/A ?Aspirin Instructions: ?Last Dose: ? ?Activity level:   Can go up a flight of stairs and perform activities of daily living without stopping and without symptoms of chest pain or shortness of breath.  Patient lives alone ?   ?Anesthesia review: Followed by cardiology for palpitations.  HTN, DM, OSA (no longer uses CPAP) ? ?Patient denies shortness of breath, fever, cough and chest pain at PAT appointment ? ?Patient verbalized understanding of instructions that were given to them at the PAT appointment. Patient was also instructed that they will need to review over the PAT instructions again at home before surgery.  ?

## 2022-01-21 NOTE — Patient Instructions (Signed)
DUE TO COVID-19 ONLY TWO VISITORS  (aged 80 and older)  IS ALLOWED TO COME WITH YOU AND STAY IN THE WAITING ROOM ONLY DURING PRE OP AND PROCEDURE.   ?**NO VISITORS ARE ALLOWED IN THE SHORT STAY AREA OR RECOVERY ROOM!!** ? ?IF YOU WILL BE ADMITTED INTO THE HOSPITAL YOU ARE ALLOWED ONLY FOUR SUPPORT PEOPLE DURING VISITATION HOURS ONLY (7 AM -8PM)   ?The support person(s) must pass our screening, gel in and out ?Visitors GUEST BADGE MUST BE WORN VISIBLY  ?One adult visitor may remain with you overnight and MUST be in the room by 8 P.M.  ? ?You are not required to quarantine ?Hand Hygiene often ?Do NOT share personal items ?Notify your provider if you are in close contact with someone who has COVID or you develop fever 100.4 or greater, new onset of sneezing, cough, sore throat, shortness of breath or body aches. ? ?     ? Your procedure is scheduled on:  01-29-22 ? ? Report to [facility] Main Entrance ? ?  Report to admitting at 5:15 AM ? ? Call this number if you have problems the morning of surgery [phone] ? ? Do not eat food :After Midnight. ? ? After Midnight you may have the following liquids until 4:30 AM DAY OF SURGERY ? ?Water ?Black Coffee (sugar ok, NO MILK/CREAM OR CREAMERS)  ?Tea (sugar ok, NO MILK/CREAM OR CREAMERS) regular and decaf                             ?Plain Jell-O (NO RED)                                           ?Fruit ices (not with fruit pulp, NO RED)                                     ?Popsicles (NO RED)                                                                  ?Juice: apple, WHITE grape, WHITE cranberry ?Sports drinks like Gatorade (NO RED) ?Clear broth(vegetable,chicken,beef) ? ?             ?  ?  ?The day of surgery:  ?Drink ONE (1) Pre-Surgery G2 at 4:30 AM the morning of surgery. Drink in one sitting. Do not sip.  ?This drink was given to you during your hospital  ?pre-op appointment visit. ?Nothing else to drink after completing the Pre-Surgery G2. ?  ?       If  you have questions, please contact your surgeon?s office. ? ? ?FOLLOW ANY ADDITIONAL PRE OP INSTRUCTIONS YOU RECEIVED FROM YOUR SURGEON'S OFFICE!!! ?  ?  ?Oral Hygiene is also important to reduce your risk of infection.                                    ?Remember - BRUSH YOUR TEETH THE  MORNING OF SURGERY WITH YOUR REGULAR TOOTHPASTE ? ? Do NOT smoke after Midnight ? ? Take these medicines the morning of surgery with A SIP OF WATER: Tylenol, Alprazolam, Duloxetine, Metoprolol, Omeprazole, Pravastatin ? ?How to Manage Your Diabetes ?Before and After Surgery ? ?Why is it important to control my blood sugar before and after surgery? ?Improving blood sugar levels before and after surgery helps healing and can limit problems. ?A way of improving blood sugar control is eating a healthy diet by: ? Eating less sugar and carbohydrates ? Increasing activity/exercise ? Talking with your doctor about reaching your blood sugar goals ?High blood sugars (greater than 180 mg/dL) can raise your risk of infections and slow your recovery, so you will need to focus on controlling your diabetes during the weeks before surgery. ?Make sure that the doctor who takes care of your diabetes knows about your planned surgery including the date and location. ? ?How do I manage my blood sugar before surgery? ?Check your blood sugar at least 4 times a day, starting 2 days before surgery, to make sure that the level is not too high or low. ?Check your blood sugar the morning of your surgery when you wake up and every 2 hours until you get to the Short Stay unit. ?If your blood sugar is less than 70 mg/dL, you will need to treat for low blood sugar: ?Do not take insulin. ?Treat a low blood sugar (less than 70 mg/dL) with ? cup of clear juice (cranberry or apple), 4 glucose tablets, OR glucose gel. ?Recheck blood sugar in 15 minutes after treatment (to make sure it is greater than 70 mg/dL). If your blood sugar is not greater than 70 mg/dL on  recheck, call [phone] for further instructions. ?Report your blood sugar to the short stay nurse when you get to Short Stay. ? ?If you are admitted to the hospital after surgery: ?Your blood sugar will be checked by the staff and you will probably be given insulin after surgery (instead of oral diabetes medicines) to make sure you have good blood sugar levels. ?The goal for blood sugar control after surgery is 80-180 mg/dL. ? ? ?WHAT DO I DO ABOUT MY DIABETES MEDICATION? ? ?Do not take oral diabetes medicines (pills) the morning of surgery. ? ?THE DAY BEFORE SURGERY:  Take Januvia as prescribed ? ?THE MORNING OF SURGERY:  Do not take Januvia. ? ?The day of surgery, do not take other diabetes injectables, including Byetta (exenatide), Bydureon (exenatide ER), Victoza (liraglutide), or Trulicity (dulaglutide). ? ?Reviewed and Endorsed by [facility] Patient Education Committee, August 2015  ? ?Bring CPAP mask and tubing day of surgery. ?                  ?           You may not have any metal on your body including hair pins, jewelry, and body piercing ? ?           Do not wear make-up, lotions, powders, perfumes or deodorant ? ?Do not wear nail polish including gel and S&S, artificial/acrylic nails, or any other type of covering on natural nails including finger and toenails. If you have artificial nails, gel coating, etc. that needs to be removed by a nail salon please have this removed prior to surgery or surgery may need to be canceled/ delayed if the surgeon/ anesthesia feels like they are unable to be safely monitored.  ? ?Do not shave  48 hours prior  to surgery.  ? ?     Do not bring valuables to the hospital. [place]. ? ? Contacts, dentures or bridgework may not be worn into surgery. ? ? Bring small overnight bag day of surgery. ?  ?Patients discharged on the day of surgery will not be allowed to drive home.  Someone NEEDS to stay with you for the first 24 hours  after anesthesia. ? ?Special Instructions: Bring a copy of your healthcare power of attorney and living will documents the day of surgery if you haven't scanned them before. ? ?Please read over the following fact sheets you were given: IF [place] [phone] ? ?[place] - Preparing for Surgery ?Before surgery, you can play an important role.  Because skin is not sterile, your skin needs to be as free of germs as possible.  You can reduce the number of germs on your skin by washing with CHG (chlorahexidine gluconate) soap before surgery.  CHG is an antiseptic cleaner which kills germs and bonds with the skin to continue killing germs even after washing. ?Please DO NOT use if you have an allergy to CHG or antibacterial soaps.  If your skin becomes reddened/irritated stop using the CHG and inform your nurse when you arrive at Short Stay. ?Do not shave (including legs and underarms) for at least 48 hours prior to the first CHG shower.  You may shave your face/neck. ? ?Please follow these instructions carefully: ? 1.  Shower with CHG Soap the night before surgery and the  morning of surgery. ? 2.  If you choose to wash your hair, wash your hair first as usual with your normal  shampoo. ? 3.  After you shampoo, rinse your hair and body thoroughly to remove the shampoo.                            ? 4.  Use CHG as you would any other liquid soap.  You can apply chg directly to the skin and wash.  Gently with a scrungie or clean washcloth. ? 5.  Apply the CHG Soap to your body ONLY FROM THE NECK DOWN.   Do   not use on face/ open      ?                     Wound or open sores. Avoid contact with eyes, ears mouth and   genitals (private parts).  ?                     Production manager,  Genitals (private parts) with your normal soap. ?            6.  Wash thoroughly, paying special attention to the area where your    surgery  will be performed. ? 7.  Thoroughly rinse your body  with warm water from the neck down. ? 8.  DO NOT shower/wash with your normal soap after using and rinsing off the CHG Soap. ?               9.  Pat yourself dry with a clean towel. ?           10.  Wear clean p

## 2022-01-26 ENCOUNTER — Encounter (HOSPITAL_COMMUNITY)
Admission: RE | Admit: 2022-01-26 | Discharge: 2022-01-26 | Disposition: A | Discharge status: Home / Self Care | Payer: Medicare Other | Source: Ambulatory Visit | Attending: Orthopedic Surgery | Admitting: Orthopedic Surgery

## 2022-01-26 ENCOUNTER — Other Ambulatory Visit: Payer: Self-pay

## 2022-01-26 ENCOUNTER — Encounter (HOSPITAL_COMMUNITY): Payer: Self-pay

## 2022-01-26 ENCOUNTER — Ambulatory Visit (HOSPITAL_COMMUNITY)
Admission: RE | Admit: 2022-01-26 | Discharge: 2022-01-26 | Disposition: A | Discharge status: Home / Self Care | Payer: Medicare Other | Source: Ambulatory Visit | Attending: Orthopedic Surgery | Admitting: Orthopedic Surgery

## 2022-01-26 VITALS — BP 152/83 | HR 91 | Temp 97.8°F | Resp 20 | Ht 64.5 in | Wt 215.0 lb

## 2022-01-26 DIAGNOSIS — E119 Type 2 diabetes mellitus without complications: Secondary | ICD-10-CM

## 2022-01-26 DIAGNOSIS — K769 Liver disease, unspecified: Secondary | ICD-10-CM

## 2022-01-26 DIAGNOSIS — J9811 Atelectasis: Secondary | ICD-10-CM | POA: Diagnosis not present

## 2022-01-26 DIAGNOSIS — Z01818 Encounter for other preprocedural examination: Secondary | ICD-10-CM

## 2022-01-26 HISTORY — DX: Anemia, unspecified: D64.9

## 2022-01-26 LAB — COMPREHENSIVE METABOLIC PANEL
ALT: 12 U/L (ref 0–44)
AST: 20 U/L (ref 15–41)
Albumin: 4 g/dL (ref 3.5–5.0)
Alkaline Phosphatase: 53 U/L (ref 38–126)
Anion gap: 10 (ref 5–15)
BUN: 16 mg/dL (ref 8–23)
CO2: 24 mmol/L (ref 22–32)
Calcium: 9 mg/dL (ref 8.9–10.3)
Chloride: 107 mmol/L (ref 98–111)
Creatinine, Ser: 1 mg/dL (ref 0.44–1.00)
GFR, Estimated: 57 mL/min — ABNORMAL LOW (ref >60)
Glucose, Bld: 96 mg/dL (ref 70–99)
Potassium: 3.6 mmol/L (ref 3.5–5.1)
Sodium: 141 mmol/L (ref 135–145)
Total Bilirubin: 0.5 mg/dL (ref 0.3–1.2)
Total Protein: 7.2 g/dL (ref 6.5–8.1)

## 2022-01-26 LAB — CBC
HCT: 40.1 % (ref 36.0–46.0)
Hemoglobin: 12.9 g/dL (ref 12.0–15.0)
MCH: 29.7 pg (ref 26.0–34.0)
MCHC: 32.2 g/dL (ref 30.0–36.0)
MCV: 92.2 fL (ref 80.0–100.0)
Platelets: 290 10*3/uL (ref 150–400)
RBC: 4.35 MIL/uL (ref 3.87–5.11)
RDW: 13.2 % (ref 11.5–15.5)
WBC: 11.1 10*3/uL — ABNORMAL HIGH (ref 4.0–10.5)
nRBC: 0 % (ref 0.0–0.2)

## 2022-01-26 LAB — GLUCOSE, CAPILLARY: Glucose-Capillary: 112 mg/dL — ABNORMAL HIGH (ref 70–99)

## 2022-01-26 LAB — HEMOGLOBIN A1C
Hgb A1c MFr Bld: 6.2 % — ABNORMAL HIGH (ref 4.8–5.6)
Mean Plasma Glucose: 131.24 mg/dL

## 2022-01-26 LAB — SURGICAL PCR SCREEN
MRSA, PCR: NEGATIVE
Staphylococcus aureus: POSITIVE — AB

## 2022-01-27 NOTE — Progress Notes (Signed)
PCR results sent to Dr. Berenice Primas to review. ?

## 2022-01-28 NOTE — Anesthesia Preprocedure Evaluation (Addendum)
Anesthesia Evaluation  ?Patient identified by MRN, date of birth, ID band ?Patient awake ? ? ? ?Reviewed: ?Allergy & Precautions, NPO status , Patient's Chart, lab work & pertinent test results, reviewed documented beta blocker date and time  ? ?History of Anesthesia Complications ?(+) PROLONGED EMERGENCE and history of anesthetic complications ? ?Airway ?Mallampati: III ? ?TM Distance: >3 FB ?Neck ROM: Full ? ? ? Dental ? ?(+) Dental Advisory Given, Implants ?  ?Pulmonary ?asthma , sleep apnea (noncompliant) ,  ?  ?Pulmonary exam normal ? ? ? ? ? ? ? Cardiovascular ?hypertension, Pt. on home beta blockers and Pt. on medications ?Normal cardiovascular exam ? ? ?  ?Neuro/Psych ?PSYCHIATRIC DISORDERS Anxiety Depression negative neurological ROS ?   ? GI/Hepatic ?Neg liver ROS, GERD  Medicated and Controlled,  ?Endo/Other  ?diabetes, Type 2, Oral Hypoglycemic Agents ?Obesity ? ? Renal/GU ?negative Renal ROS  ? ?  ?Musculoskeletal ? ?(+) Arthritis ,  ? Abdominal ?  ?Peds ? Hematology ?negative hematology ROS ?(+)  ?plt 290k ?   ?Anesthesia Other Findings ? ? Reproductive/Obstetrics ? ?  ? ? ? ? ? ? ? ? ? ? ? ? ? ?  ?  ? ? ? ? ? ? ? ?Anesthesia Physical ?Anesthesia Plan ? ?ASA: 3 ? ?Anesthesia Plan: Spinal  ? ?Post-op Pain Management: Regional block* and Tylenol PO (pre-op)*  ? ?Induction:  ? ?PONV Risk Score and Plan: 2 and Treatment may vary due to age or medical condition and Propofol infusion ? ?Airway Management Planned: Natural Airway and Simple Face Mask ? ?Additional Equipment: None ? ?Intra-op Plan:  ? ?Post-operative Plan:  ? ?Informed Consent: I have reviewed the patients History and Physical, chart, labs and discussed the procedure including the risks, benefits and alternatives for the proposed anesthesia with the patient or authorized representative who has indicated his/her understanding and acceptance.  ? ? ? ? ? ?Plan Discussed with: CRNA and  Anesthesiologist ? ?Anesthesia Plan Comments: (Labs reviewed, platelets acceptable. Discussed risks and benefits of spinal, including spinal/epidural hematoma, infection, failed block, and PDPH. Patient expressed understanding and wished to proceed. )  ? ? ? ? ? ?Anesthesia Quick Evaluation ? ?

## 2022-01-29 ENCOUNTER — Ambulatory Visit (HOSPITAL_BASED_OUTPATIENT_CLINIC_OR_DEPARTMENT_OTHER): Payer: Medicare Other | Admitting: Anesthesiology

## 2022-01-29 ENCOUNTER — Other Ambulatory Visit: Payer: Self-pay

## 2022-01-29 ENCOUNTER — Observation Stay (HOSPITAL_COMMUNITY)
Admission: RE | Admit: 2022-01-29 | Discharge: 2022-01-30 | Disposition: A | Discharge status: Home Health | Payer: Medicare Other | Attending: Orthopedic Surgery | Admitting: Orthopedic Surgery

## 2022-01-29 ENCOUNTER — Encounter (HOSPITAL_COMMUNITY): Payer: Self-pay | Admitting: Orthopedic Surgery

## 2022-01-29 ENCOUNTER — Ambulatory Visit (HOSPITAL_COMMUNITY): Payer: Medicare Other | Admitting: Physician Assistant

## 2022-01-29 ENCOUNTER — Encounter (HOSPITAL_COMMUNITY)
Admission: RE | Disposition: A | Discharge status: Home Health | Payer: Self-pay | Source: Home / Self Care | Attending: Orthopedic Surgery

## 2022-01-29 DIAGNOSIS — Z96652 Presence of left artificial knee joint: Secondary | ICD-10-CM | POA: Insufficient documentation

## 2022-01-29 DIAGNOSIS — Z7984 Long term (current) use of oral hypoglycemic drugs: Secondary | ICD-10-CM | POA: Diagnosis not present

## 2022-01-29 DIAGNOSIS — M1712 Unilateral primary osteoarthritis, left knee: Secondary | ICD-10-CM | POA: Diagnosis not present

## 2022-01-29 DIAGNOSIS — Z01818 Encounter for other preprocedural examination: Secondary | ICD-10-CM

## 2022-01-29 DIAGNOSIS — I2510 Atherosclerotic heart disease of native coronary artery without angina pectoris: Secondary | ICD-10-CM

## 2022-01-29 DIAGNOSIS — J45909 Unspecified asthma, uncomplicated: Secondary | ICD-10-CM | POA: Diagnosis not present

## 2022-01-29 DIAGNOSIS — I10 Essential (primary) hypertension: Secondary | ICD-10-CM | POA: Insufficient documentation

## 2022-01-29 DIAGNOSIS — Z853 Personal history of malignant neoplasm of breast: Secondary | ICD-10-CM | POA: Insufficient documentation

## 2022-01-29 DIAGNOSIS — K769 Liver disease, unspecified: Secondary | ICD-10-CM

## 2022-01-29 DIAGNOSIS — E1140 Type 2 diabetes mellitus with diabetic neuropathy, unspecified: Secondary | ICD-10-CM | POA: Diagnosis not present

## 2022-01-29 DIAGNOSIS — E119 Type 2 diabetes mellitus without complications: Secondary | ICD-10-CM | POA: Diagnosis not present

## 2022-01-29 DIAGNOSIS — G8918 Other acute postprocedural pain: Secondary | ICD-10-CM | POA: Diagnosis not present

## 2022-01-29 HISTORY — PX: TOTAL KNEE ARTHROPLASTY: SHX125

## 2022-01-29 LAB — GLUCOSE, CAPILLARY
Glucose-Capillary: 127 mg/dL — ABNORMAL HIGH (ref 70–99)
Glucose-Capillary: 142 mg/dL — ABNORMAL HIGH (ref 70–99)

## 2022-01-29 SURGERY — ARTHROPLASTY, KNEE, TOTAL
Anesthesia: Spinal | Site: Knee | Laterality: Left

## 2022-01-29 MED ORDER — FENTANYL CITRATE PF 50 MCG/ML IJ SOSY
25.0000 ug | PREFILLED_SYRINGE | INTRAMUSCULAR | Status: DC | PRN
  Administered 2022-01-29 (×3): 50 ug via INTRAVENOUS

## 2022-01-29 MED ORDER — PROPOFOL 10 MG/ML IV BOLUS
INTRAVENOUS | Status: DC | PRN
  Administered 2022-01-29: 20 mg via INTRAVENOUS
  Administered 2022-01-29: 80 mg via INTRAVENOUS
  Administered 2022-01-29 (×2): 20 mg via INTRAVENOUS

## 2022-01-29 MED ORDER — DIPHENHYDRAMINE HCL 12.5 MG/5ML PO ELIX: 12.5000 mg | ORAL_SOLUTION | ORAL | Status: DC | PRN

## 2022-01-29 MED ORDER — BUPIVACAINE-EPINEPHRINE (PF) 0.5% -1:200000 IJ SOLN
INTRAMUSCULAR | Status: AC
  Filled 2022-01-29: qty 30

## 2022-01-29 MED ORDER — DOCUSATE SODIUM 100 MG PO CAPS
100.0000 mg | ORAL_CAPSULE | Freq: Two times a day (BID) | ORAL | Status: DC
Start: 2022-01-29 — End: 2022-01-30
  Administered 2022-01-29 – 2022-01-30 (×3): 100 mg via ORAL
  Filled 2022-01-29 (×3): qty 1

## 2022-01-29 MED ORDER — BISACODYL 5 MG PO TBEC: 5.0000 mg | DELAYED_RELEASE_TABLET | Freq: Every day | ORAL | Status: DC | PRN

## 2022-01-29 MED ORDER — ROPIVACAINE HCL 7.5 MG/ML IJ SOLN
INTRAMUSCULAR | Status: DC | PRN
  Administered 2022-01-29: 20 mL via PERINEURAL

## 2022-01-29 MED ORDER — PROPOFOL 1000 MG/100ML IV EMUL
INTRAVENOUS | Status: AC
  Filled 2022-01-29: qty 100

## 2022-01-29 MED ORDER — ASPIRIN EC 325 MG PO TBEC: 325.0000 mg | DELAYED_RELEASE_TABLET | Freq: Two times a day (BID) | ORAL | 0 refills | Status: DC

## 2022-01-29 MED ORDER — CEFAZOLIN SODIUM-DEXTROSE 2-4 GM/100ML-% IV SOLN
2.0000 g | INTRAVENOUS | Status: AC
  Administered 2022-01-29: 2 g via INTRAVENOUS
  Filled 2022-01-29: qty 100

## 2022-01-29 MED ORDER — KCL IN DEXTROSE-NACL 20-5-0.45 MEQ/L-%-% IV SOLN
INTRAVENOUS | Status: DC
Start: 2022-01-29 — End: 2022-01-30
  Filled 2022-01-29 (×7): qty 1000

## 2022-01-29 MED ORDER — PROPOFOL 500 MG/50ML IV EMUL
INTRAVENOUS | Status: DC | PRN
Start: 2022-01-29 — End: 2022-01-29
  Administered 2022-01-29: 25 ug/kg/min via INTRAVENOUS

## 2022-01-29 MED ORDER — WATER FOR IRRIGATION, STERILE IR SOLN
Status: DC | PRN
  Administered 2022-01-29: 2000 mL

## 2022-01-29 MED ORDER — FENTANYL CITRATE (PF) 100 MCG/2ML IJ SOLN
INTRAMUSCULAR | Status: DC | PRN
  Administered 2022-01-29 (×5): 50 ug via INTRAVENOUS

## 2022-01-29 MED ORDER — BUPIVACAINE LIPOSOME 1.3 % IJ SUSP
INTRAMUSCULAR | Status: DC | PRN
  Administered 2022-01-29: 20 mL

## 2022-01-29 MED ORDER — MIDAZOLAM HCL 2 MG/2ML IJ SOLN
INTRAMUSCULAR | Status: AC
  Filled 2022-01-29: qty 2

## 2022-01-29 MED ORDER — BUPIVACAINE-EPINEPHRINE 0.5% -1:200000 IJ SOLN
INTRAMUSCULAR | Status: DC | PRN
  Administered 2022-01-29: 30 mL

## 2022-01-29 MED ORDER — PROPOFOL 10 MG/ML IV BOLUS
INTRAVENOUS | Status: AC
  Filled 2022-01-29: qty 20

## 2022-01-29 MED ORDER — DEXAMETHASONE SODIUM PHOSPHATE 10 MG/ML IJ SOLN
INTRAMUSCULAR | Status: DC | PRN
  Administered 2022-01-29: 8 mg via INTRAVENOUS

## 2022-01-29 MED ORDER — FENTANYL CITRATE (PF) 100 MCG/2ML IJ SOLN
INTRAMUSCULAR | Status: AC
  Filled 2022-01-29: qty 2

## 2022-01-29 MED ORDER — ONDANSETRON HCL 4 MG/2ML IJ SOLN
INTRAMUSCULAR | Status: DC | PRN
  Administered 2022-01-29: 4 mg via INTRAVENOUS

## 2022-01-29 MED ORDER — METOPROLOL SUCCINATE ER 50 MG PO TB24
100.0000 mg | ORAL_TABLET | Freq: Every day | ORAL | Status: DC
  Administered 2022-01-30: 100 mg via ORAL
  Filled 2022-01-29: qty 2

## 2022-01-29 MED ORDER — ACETAMINOPHEN 500 MG PO TABS
1000.0000 mg | ORAL_TABLET | Freq: Four times a day (QID) | ORAL | Status: AC
  Administered 2022-01-29 – 2022-01-30 (×4): 1000 mg via ORAL
  Filled 2022-01-29 (×4): qty 2

## 2022-01-29 MED ORDER — HYDROMORPHONE HCL 1 MG/ML IJ SOLN
0.5000 mg | INTRAMUSCULAR | Status: DC | PRN
  Administered 2022-01-29: 1 mg via INTRAVENOUS
  Filled 2022-01-29: qty 1

## 2022-01-29 MED ORDER — SODIUM CHLORIDE 0.9 % IR SOLN
Status: DC | PRN
  Administered 2022-01-29: 1000 mL

## 2022-01-29 MED ORDER — HYDROMORPHONE HCL 2 MG PO TABS
2.0000 mg | ORAL_TABLET | ORAL | Status: DC | PRN
  Administered 2022-01-29 – 2022-01-30 (×2): 2 mg via ORAL
  Filled 2022-01-29: qty 1

## 2022-01-29 MED ORDER — MIDAZOLAM HCL 2 MG/2ML IJ SOLN
INTRAMUSCULAR | Status: DC | PRN
  Administered 2022-01-29 (×2): 1 mg via INTRAVENOUS

## 2022-01-29 MED ORDER — PANTOPRAZOLE SODIUM 40 MG PO TBEC
40.0000 mg | DELAYED_RELEASE_TABLET | Freq: Every day | ORAL | Status: DC
  Administered 2022-01-29 – 2022-01-30 (×2): 40 mg via ORAL
  Filled 2022-01-29 (×2): qty 1

## 2022-01-29 MED ORDER — SODIUM CHLORIDE (PF) 0.9 % IJ SOLN
INTRAMUSCULAR | Status: AC
  Filled 2022-01-29: qty 50

## 2022-01-29 MED ORDER — 0.9 % SODIUM CHLORIDE (POUR BTL) OPTIME
TOPICAL | Status: DC | PRN
  Administered 2022-01-29: 1000 mL

## 2022-01-29 MED ORDER — TIZANIDINE HCL 2 MG PO TABS: 2.0000 mg | ORAL_TABLET | Freq: Four times a day (QID) | ORAL | 0 refills | Status: AC | PRN

## 2022-01-29 MED ORDER — METOCLOPRAMIDE HCL 5 MG/ML IJ SOLN: 5.0000 mg | Freq: Three times a day (TID) | INTRAMUSCULAR | Status: DC | PRN

## 2022-01-29 MED ORDER — DULOXETINE HCL 60 MG PO CPEP
60.0000 mg | ORAL_CAPSULE | Freq: Every day | ORAL | Status: DC
  Administered 2022-01-29 – 2022-01-30 (×2): 60 mg via ORAL
  Filled 2022-01-29 (×2): qty 1

## 2022-01-29 MED ORDER — ACETAMINOPHEN 325 MG PO TABS: 325.0000 mg | ORAL_TABLET | Freq: Four times a day (QID) | ORAL | Status: DC | PRN

## 2022-01-29 MED ORDER — ONDANSETRON HCL 4 MG PO TABS: 4.0000 mg | ORAL_TABLET | Freq: Four times a day (QID) | ORAL | Status: DC | PRN

## 2022-01-29 MED ORDER — METHOCARBAMOL 500 MG IVPB - SIMPLE MED
INTRAVENOUS | Status: AC
  Filled 2022-01-29: qty 50

## 2022-01-29 MED ORDER — LABETALOL HCL 5 MG/ML IV SOLN
INTRAVENOUS | Status: AC
  Filled 2022-01-29: qty 4

## 2022-01-29 MED ORDER — TRANEXAMIC ACID-NACL 1000-0.7 MG/100ML-% IV SOLN
1000.0000 mg | Freq: Once | INTRAVENOUS | Status: AC
  Administered 2022-01-29: 1000 mg via INTRAVENOUS
  Filled 2022-01-29: qty 100

## 2022-01-29 MED ORDER — HYDROMORPHONE HCL 2 MG PO TABS
1.0000 mg | ORAL_TABLET | ORAL | Status: DC | PRN
  Administered 2022-01-30: 2 mg via ORAL
  Administered 2022-01-30: 1 mg via ORAL
  Filled 2022-01-29 (×4): qty 1

## 2022-01-29 MED ORDER — DOCUSATE SODIUM 100 MG PO CAPS: 100.0000 mg | ORAL_CAPSULE | Freq: Two times a day (BID) | ORAL | 2 refills | Status: AC

## 2022-01-29 MED ORDER — IRBESARTAN 150 MG PO TABS
150.0000 mg | ORAL_TABLET | Freq: Every day | ORAL | Status: DC
Start: 2022-01-30 — End: 2022-01-30
  Administered 2022-01-30: 150 mg via ORAL
  Filled 2022-01-29: qty 1

## 2022-01-29 MED ORDER — METHOCARBAMOL 500 MG PO TABS
500.0000 mg | ORAL_TABLET | Freq: Four times a day (QID) | ORAL | Status: DC | PRN
  Administered 2022-01-30: 500 mg via ORAL
  Filled 2022-01-29 (×2): qty 1

## 2022-01-29 MED ORDER — ONDANSETRON HCL 4 MG/2ML IJ SOLN: 4.0000 mg | Freq: Once | INTRAMUSCULAR | Status: DC | PRN

## 2022-01-29 MED ORDER — CHLORHEXIDINE GLUCONATE 0.12 % MT SOLN
15.0000 mL | Freq: Once | OROMUCOSAL | Status: AC
  Administered 2022-01-29: 15 mL via OROMUCOSAL

## 2022-01-29 MED ORDER — MENTHOL 3 MG MT LOZG: 1.0000 | LOZENGE | OROMUCOSAL | Status: DC | PRN

## 2022-01-29 MED ORDER — FENTANYL CITRATE PF 50 MCG/ML IJ SOSY
PREFILLED_SYRINGE | INTRAMUSCULAR | Status: AC
  Filled 2022-01-29: qty 1

## 2022-01-29 MED ORDER — PHENOL 1.4 % MT LIQD: 1.0000 | OROMUCOSAL | Status: DC | PRN

## 2022-01-29 MED ORDER — BUPIVACAINE LIPOSOME 1.3 % IJ SUSP
INTRAMUSCULAR | Status: AC
  Filled 2022-01-29: qty 20

## 2022-01-29 MED ORDER — LINAGLIPTIN 5 MG PO TABS
5.0000 mg | ORAL_TABLET | Freq: Every day | ORAL | Status: DC
  Administered 2022-01-29 – 2022-01-30 (×2): 5 mg via ORAL
  Filled 2022-01-29 (×2): qty 1

## 2022-01-29 MED ORDER — POVIDONE-IODINE 10 % EX SWAB
2.0000 "application " | Freq: Once | CUTANEOUS | Status: AC
  Administered 2022-01-29: 2 via TOPICAL

## 2022-01-29 MED ORDER — POLYETHYLENE GLYCOL 3350 17 G PO PACK: 17.0000 g | PACK | Freq: Every day | ORAL | Status: DC | PRN

## 2022-01-29 MED ORDER — FENTANYL CITRATE PF 50 MCG/ML IJ SOSY
PREFILLED_SYRINGE | INTRAMUSCULAR | Status: AC
  Filled 2022-01-29: qty 2

## 2022-01-29 MED ORDER — LABETALOL HCL 5 MG/ML IV SOLN
10.0000 mg | Freq: Once | INTRAVENOUS | Status: AC
  Administered 2022-01-29: 10 mg via INTRAVENOUS

## 2022-01-29 MED ORDER — ORAL CARE MOUTH RINSE: 15.0000 mL | Freq: Once | OROMUCOSAL | Status: AC

## 2022-01-29 MED ORDER — DEXAMETHASONE SODIUM PHOSPHATE 10 MG/ML IJ SOLN
INTRAMUSCULAR | Status: AC
  Filled 2022-01-29: qty 1

## 2022-01-29 MED ORDER — ASPIRIN 81 MG PO CHEW
81.0000 mg | CHEWABLE_TABLET | Freq: Two times a day (BID) | ORAL | Status: DC
  Administered 2022-01-29 – 2022-01-30 (×2): 81 mg via ORAL
  Filled 2022-01-29 (×2): qty 1

## 2022-01-29 MED ORDER — ACETAMINOPHEN 500 MG PO TABS
1000.0000 mg | ORAL_TABLET | Freq: Once | ORAL | Status: AC
  Administered 2022-01-29: 1000 mg via ORAL
  Filled 2022-01-29: qty 2

## 2022-01-29 MED ORDER — LACTATED RINGERS IV SOLN: INTRAVENOUS | Status: DC

## 2022-01-29 MED ORDER — SODIUM CHLORIDE 0.9% FLUSH
INTRAVENOUS | Status: DC | PRN
  Administered 2022-01-29: 30 mL

## 2022-01-29 MED ORDER — HYDROMORPHONE HCL 2 MG PO TABS: 2.0000 mg | ORAL_TABLET | ORAL | 0 refills | Status: DC | PRN

## 2022-01-29 MED ORDER — METOCLOPRAMIDE HCL 5 MG PO TABS: 5.0000 mg | ORAL_TABLET | Freq: Three times a day (TID) | ORAL | Status: DC | PRN

## 2022-01-29 MED ORDER — BUPIVACAINE LIPOSOME 1.3 % IJ SUSP
20.0000 mL | Freq: Once | INTRAMUSCULAR | Status: DC
Start: 2022-01-29 — End: 2022-01-29

## 2022-01-29 MED ORDER — ONDANSETRON HCL 4 MG/2ML IJ SOLN
INTRAMUSCULAR | Status: AC
  Filled 2022-01-29: qty 2

## 2022-01-29 MED ORDER — ALPRAZOLAM 0.25 MG PO TABS
0.2500 mg | ORAL_TABLET | Freq: Three times a day (TID) | ORAL | Status: DC | PRN
  Administered 2022-01-29 (×2): 0.25 mg via ORAL
  Filled 2022-01-29 (×3): qty 1

## 2022-01-29 MED ORDER — ONDANSETRON HCL 4 MG/2ML IJ SOLN
4.0000 mg | Freq: Four times a day (QID) | INTRAMUSCULAR | Status: DC | PRN
Start: 2022-01-29 — End: 2022-01-30

## 2022-01-29 MED ORDER — TRANEXAMIC ACID-NACL 1000-0.7 MG/100ML-% IV SOLN
1000.0000 mg | INTRAVENOUS | Status: AC
  Administered 2022-01-29: 1000 mg via INTRAVENOUS
  Filled 2022-01-29: qty 100

## 2022-01-29 MED ORDER — FLEET ENEMA 7-19 GM/118ML RE ENEM: 1.0000 | ENEMA | Freq: Once | RECTAL | Status: DC | PRN

## 2022-01-29 MED ORDER — ALUM & MAG HYDROXIDE-SIMETH 200-200-20 MG/5ML PO SUSP: 30.0000 mL | ORAL | Status: DC | PRN

## 2022-01-29 MED ORDER — DEXAMETHASONE SODIUM PHOSPHATE 10 MG/ML IJ SOLN
10.0000 mg | Freq: Once | INTRAMUSCULAR | Status: AC
  Administered 2022-01-30: 10 mg via INTRAVENOUS
  Filled 2022-01-29: qty 1

## 2022-01-29 MED ORDER — METHOCARBAMOL 500 MG IVPB - SIMPLE MED
500.0000 mg | Freq: Four times a day (QID) | INTRAVENOUS | Status: DC | PRN
  Administered 2022-01-29: 500 mg via INTRAVENOUS
  Filled 2022-01-29: qty 50

## 2022-01-29 SURGICAL SUPPLY — 53 items
APL SKNCLS STERI-STRIP NONHPOA (GAUZE/BANDAGES/DRESSINGS) ×1
ATTUNE PSFEM LTSZ6 NARCEM KNEE (Femur) ×1 IMPLANT
ATTUNE PSRP INSR SZ6 6 KNEE (Insert) ×1 IMPLANT
BAG COUNTER SPONGE SURGICOUNT (BAG) IMPLANT
BAG SPEC THK2 15X12 ZIP CLS (MISCELLANEOUS) ×1
BAG SPNG CNTER NS LX DISP (BAG)
BAG ZIPLOCK 12X15 (MISCELLANEOUS) ×2 IMPLANT
BASE TIBIA ATTUNE KNEE SYS SZ6 (Knees) IMPLANT
BENZOIN TINCTURE PRP APPL 2/3 (GAUZE/BANDAGES/DRESSINGS) ×2 IMPLANT
BLADE SAGITTAL 25.0X1.19X90 (BLADE) ×2 IMPLANT
BLADE SAW SGTL 13.0X1.19X90.0M (BLADE) ×2 IMPLANT
BLADE SURG SZ10 CARB STEEL (BLADE) ×4 IMPLANT
BNDG ELASTIC 6X5.8 VLCR STR LF (GAUZE/BANDAGES/DRESSINGS) ×2 IMPLANT
BOOTIES KNEE HIGH SLOAN (MISCELLANEOUS) ×2 IMPLANT
BOWL SMART MIX CTS (DISPOSABLE) ×2 IMPLANT
BSPLAT TIB 6 CMNT ROT PLAT STR (Knees) ×1 IMPLANT
CEMENT HV SMART SET (Cement) ×4 IMPLANT
CLSR STERI-STRIP ANTIMIC 1/2X4 (GAUZE/BANDAGES/DRESSINGS) ×1 IMPLANT
COVER SURGICAL LIGHT HANDLE (MISCELLANEOUS) ×2 IMPLANT
CUFF TOURN SGL QUICK 34 (TOURNIQUET CUFF) ×2
CUFF TRNQT CYL 34X4.125X (TOURNIQUET CUFF) ×1 IMPLANT
DRAPE INCISE IOBAN 66X45 STRL (DRAPES) ×2 IMPLANT
DRAPE U-SHAPE 47X51 STRL (DRAPES) ×2 IMPLANT
DRSG AQUACEL AG ADV 3.5X10 (GAUZE/BANDAGES/DRESSINGS) ×2 IMPLANT
DURAPREP 26ML APPLICATOR (WOUND CARE) ×2 IMPLANT
ELECT REM PT RETURN 15FT ADLT (MISCELLANEOUS) ×2 IMPLANT
GLOVE BIOGEL PI IND STRL 8 (GLOVE) ×2 IMPLANT
GLOVE BIOGEL PI INDICATOR 8 (GLOVE) ×2
GLOVE ECLIPSE 7.5 STRL STRAW (GLOVE) ×4 IMPLANT
GOWN STRL REUS W/ TWL XL LVL3 (GOWN DISPOSABLE) ×2 IMPLANT
GOWN STRL REUS W/TWL XL LVL3 (GOWN DISPOSABLE) ×4
HANDPIECE INTERPULSE COAX TIP (DISPOSABLE) ×2
HOLDER FOLEY CATH W/STRAP (MISCELLANEOUS) IMPLANT
HOOD PEEL AWAY FLYTE STAYCOOL (MISCELLANEOUS) ×6 IMPLANT
KIT TURNOVER KIT A (KITS) IMPLANT
MANIFOLD NEPTUNE II (INSTRUMENTS) ×2 IMPLANT
NEEDLE HYPO 22GX1.5 SAFETY (NEEDLE) ×4 IMPLANT
NS IRRIG 1000ML POUR BTL (IV SOLUTION) ×2 IMPLANT
PACK TOTAL KNEE CUSTOM (KITS) ×2 IMPLANT
PADDING CAST COTTON 6X4 STRL (CAST SUPPLIES) ×2 IMPLANT
PATELLA MEDIAL ATTUN 35MM KNEE (Knees) ×1 IMPLANT
PROTECTOR NERVE ULNAR (MISCELLANEOUS) ×2 IMPLANT
SET HNDPC FAN SPRY TIP SCT (DISPOSABLE) ×1 IMPLANT
SPIKE FLUID TRANSFER (MISCELLANEOUS) ×4 IMPLANT
STRIP CLOSURE SKIN 1/2X4 (GAUZE/BANDAGES/DRESSINGS) IMPLANT
SUT MNCRL AB 3-0 PS2 18 (SUTURE) ×2 IMPLANT
SUT VIC AB 0 CT1 36 (SUTURE) ×2 IMPLANT
SUT VIC AB 1 CT1 36 (SUTURE) ×4 IMPLANT
SYR CONTROL 10ML LL (SYRINGE) ×4 IMPLANT
TIBIA ATTUNE KNEE SYS BASE SZ6 (Knees) ×2 IMPLANT
TRAY FOLEY MTR SLVR 16FR STAT (SET/KITS/TRAYS/PACK) ×2 IMPLANT
WATER STERILE IRR 1000ML POUR (IV SOLUTION) ×4 IMPLANT
WRAP KNEE MAXI GEL POST OP (GAUZE/BANDAGES/DRESSINGS) ×2 IMPLANT

## 2022-01-29 NOTE — Evaluation (Signed)
Physical Therapy Evaluation ?Patient Details ?Name: Candice Reid ?MRN: [id number] ?DOB: 08-Nov-1940 ?Today's Date: 01/29/2022 ? ?History of Present Illness ? patient is an 81 yo female s/p L-TKA on 01/29/22. PMH: R-TKA 2022 neuropathy, HTN, HLD, GERD, DM, depression, anxiety, asthma, OA, radiation, and L knee surgery, hx of breast cancer.  ?Clinical Impression ? Candice Reid is a 81 y.o. female POD 0 s/p L-TKA. Patient reports modified independence using rollator at baseline. Patient is now limited by functional impairments (see PT problem list below) and was only able to dangle at EOB secondary to not being completely thawed from anesthesia; was min assist for bed mobility to bring LLE off/on bed. Patient instructed in exercise to facilitate ROM and circulation to manage edema; pt demonstrated good form. Educated pt on incentive spirometry and pt demonstrated appropriate technique. Patient will benefit from continued skilled PT interventions to address impairments and progress towards PLOF. Acute PT will follow to progress mobility in preparation for safe discharge home.   ?   ? ?Recommendations for follow up therapy are one component of a multi-disciplinary discharge planning process, led by the attending physician.  Recommendations may be updated based on patient status, additional functional criteria and insurance authorization. ? ?Follow Up Recommendations Follow physician's recommendations for discharge plan and follow up therapies ? ?  ?Assistance Recommended at Discharge Set up Supervision/Assistance  ?Patient can return home with the following ? A little help with bathing/dressing/bathroom;A little help with walking and/or transfers;Assistance with cooking/housework;Assist for transportation;Help with stairs or ramp for entrance ? ?  ?Equipment Recommendations None recommended by PT  ?Recommendations for Other Services ?    ?  ?Functional Status Assessment Patient has had a recent decline in their  functional status and demonstrates the ability to make significant improvements in function in a reasonable and predictable amount of time.  ? ?  ?Precautions / Restrictions Precautions ?Precautions: Fall ?Restrictions ?Weight Bearing Restrictions: No  ? ?  ? ?Mobility ? Bed Mobility ?Overal bed mobility: Needs Assistance ?Bed Mobility: Supine to Sit, Sit to Supine ?  ?  ?Supine to sit: Min guard ?Sit to supine: Min assist ?  ?General bed mobility comments: Pt min guard for supine to sit for safety, pt required increased time but no physical assist, used bed rails. Pt min assist for lift assist of LLE back into bed. Pt reports dizziness getting into bed at left side, endorses that it was extant PTA. ?  ? ?Transfers ?  ?  ?  ?  ?  ?  ?  ?  ?  ?General transfer comment: deferred, pt not thawed ?  ? ?Ambulation/Gait ?  ?  ?  ?  ?  ?  ?  ?General Gait Details: deferred, pt not thawed ? ?Stairs ?  ?  ?  ?  ?  ? ?Wheelchair Mobility ?  ? ?Modified Rankin (Stroke Patients Only) ?  ? ?  ? ?Balance Overall balance assessment: Needs assistance ?Sitting-balance support: Feet supported, Bilateral upper extremity supported ?Sitting balance-Leahy Scale: Poor ?  ?  ?  ?  ?Standing balance comment: deferred, pt not thawed ?  ?  ?  ?  ?  ?  ?  ?  ?  ?  ?  ?   ? ? ? ?Pertinent Vitals/Pain Pain Assessment ?Pain Assessment: No/denies pain  ? ? ?Home Living Family/patient expects to be discharged to:: Private residence ?Living Arrangements: Alone ?Available Help at Discharge: Family;Available 24 hours/day ?Type of Home: House (Daughter sherri's  house) ?Home Access: Ramped entrance ?  ?  ?  ?Home Layout: One level ?Home Equipment: Conservation officer, nature (2 wheels);Toilet riser;Cane - single point;Rollator (4 wheels);BSC/3in1 ?   ?  ?Prior Function Prior Level of Function : Independent/Modified Independent ?  ?  ?  ?  ?  ?  ?Mobility Comments: Uses rollator for mobility ?ADLs Comments: ind ?  ? ? ?Hand Dominance  ? Dominant Hand: Right ? ?   ?Extremity/Trunk Assessment  ? Upper Extremity Assessment ?Upper Extremity Assessment: RUE deficits/detail ?RUE Deficits / Details: unable to reach behind head ?  ? ?Lower Extremity Assessment ?Lower Extremity Assessment: RLE deficits/detail;LLE deficits/detail ?RLE Deficits / Details: MMT ank pf/df 5/5 ?RLE Sensation: WNL ?LLE Deficits / Details: MMT ank pf/df 5/5, pt unable to perform SLR and reports decreased sensation on bottom of L foot "like it's asleep" ?LLE Sensation: decreased light touch ?  ? ?Cervical / Trunk Assessment ?Cervical / Trunk Assessment: Normal  ?Communication  ? Communication: No difficulties  ?Cognition Arousal/Alertness: Awake/alert ?Behavior During Therapy: [facility] for tasks assessed/performed ?Overall Cognitive Status: Within Functional Limits for tasks assessed ?  ?  ?  ?  ?  ?  ?  ?  ?  ?  ?  ?  ?  ?  ?  ?  ?  ?  ?  ? ?  ?General Comments General comments (skin integrity, edema, etc.): Pt's daughter [name] present for session. ? ?  ?Exercises Total Joint Exercises ?Ankle Circles/Pumps: AROM, Both, 20 reps ?Other Exercises ?Other Exercises: incentive spirometry x5  ? ?Assessment/Plan  ?  ?PT Assessment Patient needs continued PT services  ?PT Problem List Decreased strength;Decreased range of motion;Decreased activity tolerance;Decreased balance;Decreased mobility;Decreased coordination;Pain ? ?   ?  ?PT Treatment Interventions DME instruction;Gait training;Functional mobility training;Therapeutic activities;Therapeutic exercise;Balance training;Neuromuscular re-education;Patient/family education   ? ?PT Goals (Current goals can be found in the Care Plan section)  ?Acute Rehab PT Goals ?Patient Stated Goal: Walk without pain ?PT Goal Formulation: With patient ?Time For Goal Achievement: 02/05/22 ?Potential to Achieve Goals: Good ? ?  ?Frequency 7X/week ?  ? ? ?Co-evaluation   ?  ?  ?  ?  ? ? ?  ?AM-PAC PT "6 Clicks" Mobility  ?Outcome Measure Help needed turning from your back to your side  while in a flat bed without using bedrails?: None ?Help needed moving from lying on your back to sitting on the side of a flat bed without using bedrails?: A Little ?Help needed moving to and from a bed to a chair (including a wheelchair)?: A Little ?Help needed standing up from a chair using your arms (e.g., wheelchair or bedside chair)?: A Little ?Help needed to walk in hospital room?: A Little ?Help needed climbing 3-5 steps with a railing? : A Lot ?6 Click Score: 18 ? ?  ?End of Session   ?Activity Tolerance: Treatment limited secondary to medical complications (Comment) (pt not thawed) ?Patient left: in bed;with call bell/phone within reach;with bed alarm set;with family/visitor present;with nursing/sitter in room ?Nurse Communication: Mobility status ?PT Visit Diagnosis: Difficulty in walking, not elsewhere classified (R26.2) ?  ? ?Time: 3500-9381 ?PT Time Calculation (min) (ACUTE ONLY): 25 min ? ? ?Charges:   PT Evaluation ?$PT Eval Low Complexity: 1 Low ?PT Treatments ?$Therapeutic Activity: 8-22 mins ?  ?   ? ? ?Coolidge Breeze, PT, DPT ?[facility] Department ?Office: [phone] ?Pager: [phone] ? ?Coolidge Breeze ?01/29/2022, 2:23 PM ? ?

## 2022-01-29 NOTE — Anesthesia Procedure Notes (Deleted)
Procedure Name: [place] ?Date/Time: 01/29/2022 7:18 AM ?Performed by: Niel Hummer, CRNA ?Pre-anesthesia Checklist: Patient identified, Emergency Drugs available, Suction available and Patient being monitored ?Oxygen Delivery Method: Simple face mask ? ? ? ? ?

## 2022-01-29 NOTE — Progress Notes (Signed)
Orthopedic Tech Progress Note ?Patient Details:  ?Candice Reid ?03/05/1941 ?[id number] ? ?Ortho Devices ?Type of Ortho Device: Bone foam zero knee ?Ortho Device/Splint Interventions: Application ?  ?Post Interventions ?Patient Tolerated: Well ?Instructions Provided: Care of device ? ?[name] Pink ?01/29/2022, 10:36 AM ? ?

## 2022-01-29 NOTE — Discharge Instructions (Signed)

## 2022-01-29 NOTE — TOC Transition Note (Signed)
Transition of Care (TOC) - CM/SW Discharge Note ? ?Patient Details  ?Name: Candice Reid ?MRN: [id number] ?Date of Birth: 11-10-1940 ? ?Transition of Care (TOC) CM/SW Contact:  ?Sherie Don, LCSW ?Phone Number: ?01/29/2022, 1:15 PM ? ?Clinical Narrative: Patient is expected to discharge home after working with PT. CSW met with patient and daughter to review discharge plan and needs. Patient was prearranged with [place] for [place] and patient reported the [facility] will be 01/31/22. Patient has a walker and CPM that were set up by [employer] prior to surgery and patient has elevated toilets at home, so there are no additional DME needs at this time. TOC signing off. ? ?Final next level of care: [place] ?Barriers to Discharge: No Barriers Identified ? ?Patient Goals and CMS Choice ?Patient states their goals for this hospitalization and ongoing recovery are:: Discharge home with HHPT ?CMS Medicare.gov Compare Post Acute Care list provided to:: Patient ?Choice offered to / list presented to : Patient ? ?Discharge Plan and Services        ?DME Arranged: Gilford Rile, CPM ?DME Agency: [employer] ?Representative spoke with at DME Agency: Prearranged in orthopedist's office ?HH Arranged: PT ?[place] Agency: [place] ?Representative spoke with at [place]: Prearranged in orthopedist's office ? ?Readmission Risk Interventions ?   ? View : No data to display.  ?  ?  ?  ? ?

## 2022-01-29 NOTE — Anesthesia Postprocedure Evaluation (Signed)
Anesthesia Post Note ? ?Patient: Candice Reid ? ?Procedure(s) Performed: TOTAL KNEE ARTHROPLASTY (Left: Knee) ? ?  ? ?Patient location during evaluation: PACU ?Anesthesia Type: General ?Level of consciousness: awake and alert ?Pain management: pain level controlled ?Vital Signs Assessment: post-procedure vital signs reviewed and stable ?Respiratory status: spontaneous breathing, nonlabored ventilation and respiratory function stable ?Cardiovascular status: blood pressure returned to baseline and stable ?Postop Assessment: no apparent nausea or vomiting ?Anesthetic complications: no ? ? ?No notable events documented. ? ?Last Vitals:  ?Vitals:  ? 01/29/22 1100 01/29/22 1152  ?BP: (!) 168/100 (!) 166/90  ?Pulse: 85 83  ?Resp: 18 16  ?Temp:  36.6 ?C  ?SpO2: 98% 100%  ?  ?Last Pain:  ?Vitals:  ? 01/29/22 1152  ?TempSrc: Oral  ?PainSc:   ? ? ?  ?  ?  ?  ?  ?  ? ?Audry Pili ? ? ? ? ?

## 2022-01-29 NOTE — H&P (Signed)
TOTAL KNEE ADMISSION H&P ? ?Patient is being admitted for left total knee arthroplasty. ? ?Subjective: ? ?Chief Complaint:left knee pain. ? ?HPI: Candice Reid, 81 y.o. female, has a history of pain and functional disability in the left knee due to arthritis and has failed non-surgical conservative treatments for greater than 12 weeks to includeNSAID's and/or analgesics, corticosteriod injections, viscosupplementation injections, weight reduction as appropriate, and activity modification.  Onset of symptoms was gradual, starting 4 years ago with gradually worsening course since that time. The patient noted no past surgery on the left knee(s).  Patient currently rates pain in the left knee(s) at 9 out of 10 with activity. Patient has night pain, worsening of pain with activity and weight bearing, pain that interferes with activities of daily living, and joint swelling.  Patient has evidence of subchondral sclerosis, periarticular osteophytes, joint subluxation, and joint space narrowing by imaging studies. This patient has had  failure of all reasonable conservative care . There is no active infection. ? ?Patient Active Problem List  ? Diagnosis Date Noted  ? Status post right knee replacement 10/17/2020  ? Morbid obesity ([place]) 04/11/2018  ? Hepatic steatosis 08/11/2015  ? History of kidney stones 05/06/2015  ? Osteoarthritis   ? Vaginal dryness 02/10/2015  ? Malignant neoplasm of lower-inner quadrant of left breast in female, estrogen receptor positive ([place]) 02/15/2014  ? Personal history of failed conscious sedation 03/11/2011  ? ?Past Medical History:  ?Diagnosis Date  ? Anemia   ? Anxiety                                                                                                                                                                                                                                                                  ? Arthritis   ? Asthma   ? Breast cancer ([place]) 2015  ? Complication of  anesthesia   ? shaky-hard to wake up, blood pressure dropped  ? Dental bridge present   ? Depression   ? Diabetes ([place])   ? Fatty liver   ? GERD (gastroesophageal reflux disease)   ? Hemorrhoids   ? History of kidney stones   ? Hot flashes   ? HTN (hypertension)   ? Hyperlipemia   ? Hypertension   ?  Kidney stones   ? Neuropathy   ? Obesity   ? Obstructive sleep apnea   ? Osteoarthritis bilteral knees  ? Personal history of radiation therapy 2015  ? Phobia   ? Plantar fasciitis   ? Radiation 05/14/2014  ? 44 Gy Left breast  ? Wears glasses   ? reading  ?  ?Past Surgical History:  ?Procedure Laterality Date  ? BREAST LUMPECTOMY  1985  ? left-neg  ? BREAST LUMPECTOMY Left 03/11/2014  ? left  ? COLONOSCOPY  03/11/2011  ? diverticulosis, internal hemorrhoids  ? CYSTOSCOPY W/ URETERAL STENT PLACEMENT    ? removed  ? DILATION AND CURETTAGE OF UTERUS    ? IR ABLATE LIVER CRYOABLATION  04/17/2020  ? IR RADIOLOGIST EVAL & MGMT  04/03/2020  ? IR RADIOLOGIST EVAL & MGMT  05/22/2020  ? KNEE SURGERY Left   ? x 2-left  ? OOPHORECTOMY Bilateral   ? TOTAL KNEE ARTHROPLASTY Right 10/17/2020  ? Procedure: TOTAL KNEE ARTHROPLASTY;  Surgeon: Dorna Leitz, MD;  Location: [facility];  Service: Orthopedics;  Laterality: Right;  ? VAGINAL HYSTERECTOMY  1988  ? endometriosis, fibroids  ?  ?Current Facility-Administered Medications  ?Medication Dose Route Frequency Provider Last Rate Last Admin  ? bupivacaine liposome (EXPAREL) 1.3 % injection 266 mg  20 mL Other Once Dorna Leitz, MD      ? ceFAZolin (ANCEF) IVPB 2g/100 mL premix  2 g Intravenous On Call to OR Dorna Leitz, MD      ? lactated ringers infusion   Intravenous Continuous Myrtie Soman, MD 10 mL/hr at 01/29/22 0628 New Bag at 01/29/22 3614  ? tranexamic acid (CYKLOKAPRON) IVPB 1,000 mg  1,000 mg Intravenous To OR Dorna Leitz, MD      ? ?Facility-Administered Medications Ordered in Other Encounters  ?Medication Dose Route Frequency Provider Last Rate Last Admin  ? fentaNYL (SUBLIMAZE)  injection   Intravenous Anesthesia Intra-op Niel Hummer, CRNA   50 mcg at 01/29/22 4315  ? midazolam (VERSED) injection   Intravenous Anesthesia Intra-op Niel Hummer, CRNA   1 mg at 01/29/22 4008  ? ropivacaine (PF) 7.5 mg/mL (0.75%) (NAROPIN) injection   Peri-NEURAL Anesthesia Intra-op Audry Pili, MD   20 mL at 01/29/22 6761  ? ?Allergies  ?Allergen Reactions  ? Ciprofloxacin   ?  unknown  ? Oxycodone-Acetaminophen Nausea Only  ? Tyloxapol Nausea And Vomiting  ? Ace Inhibitors Cough  ? Metronidazole Nausea Only  ? Morphine And Related Itching  ? Oxycodone-Acetaminophen Nausea Only  ? Septra [Sulfamethoxazole-Trimethoprim] Nausea Only  ? Sulfa Antibiotics Nausea Only  ? Valium [Diazepam] Other (See Comments)  ?  depression  ?  ?Social History  ? ?Tobacco Use  ? Smoking status: Never  ? Smokeless tobacco: Never  ?Substance Use Topics  ? Alcohol use: No  ?  ?Family History  ?Problem Relation Age of Onset  ? Diabetes Mother   ? Colon polyps Sister   ? Lung cancer Sister   ? Ovarian cancer Sister   ? Brain cancer Sister   ? Prostate cancer Father   ? Brain cancer Brother   ? Colon polyps Maternal Aunt   ? Renal Disease Sister   ? Arthritis Other   ?     [place]  ? Colon cancer Neg Hx   ?  ? ?Review of Systems ?ROS: I have reviewed the patient's review of systems thoroughly and there are no positive responses as relates to the HPI.  ?Objective: ? ?Physical Exam ? ?  Vital signs in last 24 hours: ?Temp:  [97.5 ?F (36.4 ?C)] 97.5 ?F (36.4 ?C) (05/05 0606) ?Pulse Rate:  [82] 82 (05/05 0606) ?Resp:  [20] 20 (05/05 0606) ?BP: (140)/(71) 140/71 (05/05 0606) ?SpO2:  [94 %] 94 % (05/05 0606) ?Weight:  [97.5 kg] 97.5 kg (05/05 0617) ?Well-developed well-nourished patient in no acute distress. ?Alert and oriented x3 ?HEENT:within normal limits ?Cardiac: Regular rate and rhythm ?Pulmonary: Lungs clear to auscultation ?Abdomen: Soft and nontender.  Normal active bowel sounds ? ?Musculoskeletal: (Left knee: Painful  range of motion.  Limited range of motion.  No instability.  Trace effusion.  No erythema or warmth.  Neurovascular intact distally. ?Labs: ?Recent Results (from the past 2160 hour(s))  ?Glucose, capillary     Status: Abnormal  ? Collection Time: 01/26/22  1:03 PM  ?Result Value Ref Range  ? Glucose-Capillary 112 (H) 70 - 99 mg/dL  ?  Comment: Glucose reference range applies only to samples taken after fasting for at least 8 hours.  ?Type and screen Order type and screen if day of surgery is less than 15 days from draw of preadmission visit or order morning of surgery if day of surgery is greater than 6 days from preadmission visit.     Status: None  ? Collection Time: 01/26/22  1:29 PM  ?Result Value Ref Range  ? ABO/RH(D) A POS   ? Antibody Screen NEG   ? Sample Expiration 02/09/2022,2359   ? Extend sample reason    ?  NO TRANSFUSIONS OR PREGNANCY IN THE PAST 3 MONTHS ?Performed at [facility], [place] [address], [place], [place] [address] ?  ?Surgical pcr screen     Status: Abnormal  ? Collection Time: 01/26/22  1:30 PM  ? Specimen: Nasal Mucosa; Nasal Swab  ?Result Value Ref Range  ? MRSA, PCR NEGATIVE NEGATIVE  ? Staphylococcus aureus POSITIVE (A) NEGATIVE  ?  Comment: (NOTE) ?The Xpert SA Assay (FDA approved for NASAL specimens in patients 57 ?years of age and older), is one component of a comprehensive ?surveillance program. It is not intended to diagnose infection nor to ?guide or monitor treatment. ?Performed at [facility], [place] [address] ?[place], [place] [address] ?  ?Hemoglobin A1c per protocol     Status: Abnormal  ? Collection Time: 01/26/22  1:30 PM  ?Result Value Ref Range  ? Hgb A1c MFr Bld 6.2 (H) 4.8 - 5.6 %  ?  Comment: (NOTE) ?Pre diabetes:          5.7%-6.4% ? ?Diabetes:              >6.4% ? ?Glycemic control for   <7.0% ?adults with diabetes ?  ? Mean Plasma Glucose 131.24 mg/dL  ?  Comment: Performed at [facility], [place] [address],  [place], [place] [address]  ?CBC per protocol     Status: Abnormal  ? Collection Time: 01/26/22  1:30 PM  ?Result Value Ref Range  ? WBC 11.1 (H) 4.0 - 10.5 K/uL  ? RBC 4.35 3.87 - 5.11 MIL/uL  ? Hemoglobin 12.9 12.0 -

## 2022-01-29 NOTE — Plan of Care (Signed)
  Problem: Education: Goal: Knowledge of General Education information will improve Description Including pain rating scale, medication(s)/side effects and non-pharmacologic comfort measures Outcome: Progressing   

## 2022-01-29 NOTE — Op Note (Signed)
PATIENT ID:      Candice Reid  ?MRN:     [id number] ?DOB/AGE:    [birth year] / 81 y.o. ? ? ?    OPERATIVE REPORT  ? ?DATE OF PROCEDURE:  01/29/2022   ?   ?PREOPERATIVE DIAGNOSIS:   LEFT KNEE SEVERE DEGENERATIVE JOINT DISEASE  ?    Estimated body mass index is 36.33 kg/m? as calculated from the following: ?  Height as of this encounter: 5' 4.5" (1.638 m). ?  Weight as of this encounter: 97.5 kg. ?                                                      ?POSTOPERATIVE DIAGNOSIS:   Same                                                                ?  ?PROCEDURE:  Procedure(s): ?TOTAL KNEE ARTHROPLASTY Using DepuyAttune RP implants #6N Femur, #6Tibia, 6 mm Attune RP bearing, 35 Patella ?   ?SURGEON: Alta Corning  ?ASSISTANT:   Kerry Hough. [facility]   (Present and scrubbed throughout the case, critical for assistance with exposure, retraction, instrumentation, and closure.)      ?  ?ANESTHESIA: spinal, 20cc Exparel, 50cc 0.25% Marcaine ?EBL: min cc ?FLUID REPLACEMENT: unk cc crystaloid ?TOURNIQUET: ?DRAINS: None ?TRANEXAMIC ACID: 1gm IV, 2gm topical ?COMPLICATIONS:  None      ?   ?INDICATIONS FOR PROCEDURE: The patient has ? LEFT KNEE SEVERE DEGENERATIVE JOINT DISEASE, mild bvalgus deformities, XR shows bone on bone arthritis, lateral subluxation of tibia. Patient has failed all conservative measures including anti-inflammatory medicines, narcotics, attempts at exercise and weight loss, cortisone injections and viscosupplementation.  Risks and benefits of surgery have been discussed, questions answered.  ? ?DESCRIPTION OF PROCEDURE: The patient identified by armband, received  IV antibiotics, in the holding area at [facility]. Patient taken to the operating room, appropriate anesthetic monitors were attached, and spinal anesthesia was  induced. IV Tranexamic acid was given.Tourniquet applied high to the operative thigh. Lateral post and foot positioner applied to the table, the lower extremity was then prepped and  draped in usual sterile fashion from the toes to the tourniquet. Time-out procedure was performed. Kerry Hough. [facility] PAC, was present and scrubbed throughout the case, critical for assistance with, positioning, exposure, retraction, instrumentation, and closure.The skin and subcutaneous tissue along the incision was injected with 20 cc of a mixture of Exparel and Marcaine solution, using a 20-gauge by 1-1/2 inch needle. We began the operation, with the knee flexed 130 degrees, by making the anterior midline incision starting at handbreadth above the patella going over the patella 1 cm medial to and 4 cm distal to the tibial tubercle. Small bleeders in the skin and the subcutaneous tissue identified and cauterized. Transverse retinaculum was incised and reflected medially and a medial parapatellar arthrotomy was accomplished. the patella was everted and theprepatellar fat pad resected. The superficial medial collateral ligament was then elevated from anterior to posterior along the proximal flare of the tibia and anterior half of the menisci resected. The knee was hyperflexed  exposing bone on bone arthritis. Peripheral and notch osteophytes as well as the cruciate ligaments were then resected. We continued to work our way around posteriorly along the proximal tibia, and externally rotated the tibia subluxing it out from underneath the femur. A McHale PCL retractor was placed through the notch and a lateral Hohmann retractor placed, and we then entered the proximal tibia in line with the Depuy starter drill in line with the axis of the tibia followed by an intramedullary guide rod and 0-degree posterior slope cutting guide. The tibial cutting guide, 3degree posterior sloped, was pinned into place allowing resection of 2 mm of bone medially and 10 mm of bone laterally. Satisfied with the tibial resection, we then entered the distal femur 2 mm anterior to the PCL origin with the intramedullary guide rod and applied the  distal femoral cutting guide set at 9 mm, with 5 degrees of valgus. This was pinned along the epicondylar axis. At this point, the distal femoral cut was accomplished without difficulty. We then sized for a #6N femoral component and pinned the guide in 3 degrees of external rotation. The chamfer cutting guide was pinned into place. The anterior, posterior, and chamfer cuts were accomplished without difficulty followed by the Attune RP box cutting guide and the box cut. We also removed posterior osteophytes from the posterior femoral condyles. The posterior capsule was injected with Exparel solution. The knee was brought into full extension. We checked our extension gap and fit a 6 mm bearing. Distracting in extension with a lamina spreader,  bleeders in the posterior capsule, Posterior medial and posterior lateral gutter were cauterized.  The transexamic acid-soaked sponge was then placed in the gap of the knee in extension. The knee was flexed 30?[name] Kitchen The posterior patella cut was accomplished with the 9.5 mm Attune cutting guide, sized for a 78m dome, and the fixation pegs drilled.The knee was then once again hyperflexed exposing the proximal tibia. We sized for a # 6 tibial base plate, applied the smokestack and the conical reamer followed by the the Delta fin keel punch. We then hammered into place the Attune RP trial femoral component, drilled the lugs, inserted a  6 mm trial bearing, trial patellar button, and took the knee through range of motion from 0-130 degrees. Medial and lateral ligamentous stability was checked. No thumb pressure was required for patellar Tracking. The tourniquet was 55 min. All trial components were removed, mating surfaces irrigated with pulse lavage, and dried with suction and sponges. 10 cc of the Exparel solution was applied to the cancellus bone of the patella distal femur and proximal tibia.  After waiting 30 seconds, the bony surfaces were again, dried with sponges. A double  batch of DePuy HV cement was mixed and applied to all bony metallic mating surfaces except for the posterior condyles of the femur itself. In order, we hammered into place the tibial tray and removed excess cement, the femoral component and removed excess cement. The final Attune RP bearing was inserted, and the knee brought to full extension with compression. The patellar button was clamped into place, and excess cement removed. The knee was held at 30? flexion with compression, while the cement cured. The wound was irrigated out with normal saline solution pulse lavage. The rest of the Exparel was injected into the parapatellar arthrotomy, subcutaneous tissues, and periosteal tissues. The parapatellar arthrotomy was closed with running #1 Vicryl suture. The subcutaneous tissue with 3-0 undyed Vicryl suture, and the skin with running 3-0  SQ vicryl. An Aquacil and Ace wrap were applied. The patient was taken to recovery room without difficulty.  ? ?Alta Corning ?01/29/2022, 9:05 AM ?  ?

## 2022-01-29 NOTE — Transfer of Care (Signed)
Immediate Anesthesia Transfer of Care Note ? ?Patient: Candice Reid ? ?Procedure(s) Performed: TOTAL KNEE ARTHROPLASTY (Left: Knee) ? ?Patient Location: PACU ? ?Anesthesia Type:General ? ?Level of Consciousness: drowsy ? ?Airway & Oxygen Therapy: Patient Spontanous Breathing and Patient connected to face mask oxygen ? ?Post-op Assessment: Report given to RN and Post -op Vital signs reviewed and stable ? ?Post vital signs: Reviewed and stable ? ?Last Vitals:  ?Vitals Value Taken Time  ?BP 171/85 01/29/22 0938  ?Temp    ?Pulse 76 01/29/22 0940  ?Resp 18 01/29/22 0940  ?SpO2 100 % 01/29/22 0940  ?Vitals shown include unvalidated device data. ? ?Last Pain:  ?Vitals:  ? 01/29/22 0617  ?TempSrc:   ?PainSc: 0-No pain  ?   ? ?Patients Stated Pain Goal: 3 (01/29/22 0617) ? ?Complications: No notable events documented. ?

## 2022-01-29 NOTE — Anesthesia Procedure Notes (Signed)
Procedure Name: LMA Insertion ?Date/Time: 01/29/2022 7:32 AM ?Performed by: Niel Hummer, CRNA ?Pre-anesthesia Checklist: Patient identified, Emergency Drugs available, Suction available and Patient being monitored ?Patient Re-evaluated:Patient Re-evaluated prior to induction ?Oxygen Delivery Method: Circle system utilized ?Preoxygenation: Pre-oxygenation with 100% oxygen ?Induction Type: IV induction ?LMA: LMA inserted ?LMA Size: 4.0 ?Number of attempts: 1 ?Tube secured with: Tape ?Dental Injury: Teeth and Oropharynx as per pre-operative assessment  ? ? ? ? ?

## 2022-01-29 NOTE — Anesthesia Procedure Notes (Signed)
Anesthesia Regional Block: Adductor canal block  ? ?Pre-Anesthetic Checklist: , timeout performed,  Correct Patient, Correct Site, Correct Laterality,  Correct Procedure, Correct Position, site marked,  Risks and benefits discussed,  Surgical consent,  Pre-op evaluation,  At surgeon's request and post-op pain management ? ?Laterality: Left ? ?Prep: chloraprep     ?  ?Needles:  ?Injection technique: Single-shot ? ?Needle Type: Echogenic Needle   ? ? ?Needle Length: 10cm  ?Needle Gauge: 21  ? ? ? ?Additional Needles: ? ? ?Narrative:  ?Start time: 01/29/2022 6:56 AM ?End time: 01/29/2022 6:59 AM ?Injection made incrementally with aspirations every 5 mL. ? ?Performed by: Personally  ?Anesthesiologist: Audry Pili, MD ? ?Additional Notes: ?No pain on injection. No increased resistance to injection. Injection made in 5cc increments. Good needle visualization. Patient tolerated the procedure well. ? ? ? ? ?

## 2022-01-29 NOTE — Anesthesia Procedure Notes (Signed)
Spinal ? ?Patient location during procedure: OR ?Start time: 01/29/2022 7:21 AM ?End time: 01/29/2022 7:30 AM ?Reason for block: surgical anesthesia ?Staffing ?Performed: anesthesiologist  ?Anesthesiologist: Audry Pili, MD ?Preanesthetic Checklist ?Completed: patient identified, IV checked, risks and benefits discussed, surgical consent, monitors and equipment checked, pre-op evaluation and timeout performed ?Spinal Block ?Patient position: sitting ?Prep: DuraPrep ?Patient monitoring: heart rate, cardiac monitor, continuous pulse ox and blood pressure ?Approach: midline ?Location: L3-4 ?Injection technique: single-shot ?Needle ?Needle type: Quincke  ?Needle gauge: 22 G ?Assessment ?Events: failed spinal ?Additional Notes ?Consent was obtained prior to the procedure with all questions answered and concerns addressed. Risks including, but not limited to, bleeding, infection, nerve damage, paralysis, failed block, inadequate analgesia, allergic reaction, high spinal, itching, and headache were discussed and the patient wished to proceed. Functioning IV was confirmed and monitors were applied. Sterile prep and drape, including hand hygiene, mask, and sterile gloves were used. The patient was positioned and the spine was prepped. The skin was anesthetized with lidocaine. Unable to locate intrathecal space with attempts at L3-4 and L2-3. The needle was carefully withdrawn. The procedure was aborted and decision made to proceed with general anesthesia. ? ?Renold Don, MD ? ? ? ?

## 2022-01-30 DIAGNOSIS — I10 Essential (primary) hypertension: Secondary | ICD-10-CM | POA: Diagnosis not present

## 2022-01-30 DIAGNOSIS — E1140 Type 2 diabetes mellitus with diabetic neuropathy, unspecified: Secondary | ICD-10-CM | POA: Diagnosis not present

## 2022-01-30 DIAGNOSIS — M1712 Unilateral primary osteoarthritis, left knee: Secondary | ICD-10-CM | POA: Diagnosis not present

## 2022-01-30 DIAGNOSIS — J45909 Unspecified asthma, uncomplicated: Secondary | ICD-10-CM | POA: Diagnosis not present

## 2022-01-30 DIAGNOSIS — Z96652 Presence of left artificial knee joint: Secondary | ICD-10-CM | POA: Diagnosis not present

## 2022-01-30 DIAGNOSIS — Z853 Personal history of malignant neoplasm of breast: Secondary | ICD-10-CM | POA: Diagnosis not present

## 2022-01-30 LAB — TYPE AND SCREEN
ABO/RH(D): A POS
Antibody Screen: NEGATIVE

## 2022-01-30 NOTE — Plan of Care (Addendum)
?  Problem: Education: ?Goal: Knowledge of General Education information will improve ?Description: Including pain rating scale, medication(s)/side effects and non-pharmacologic comfort measures ?Outcome: Progressing ?  ?Problem: Health Behavior/Discharge Planning: ?Goal: Ability to manage health-related needs will improve ?Outcome: Progressing ?  ?Problem: Activity: ?Goal: Risk for activity intolerance will decrease ?Outcome: Progressing ?  ?Problem: Pain Management: ?Goal: Pain level will decrease with appropriate interventions ?Outcome: Progressing ?  ?

## 2022-01-30 NOTE — Discharge Summary (Signed)
Patient ID: ?Candice Reid ?MRN: [id number] ?DOB/AGE: 80-21-1942 80 y.o. ? ?Admit date: 01/29/2022 ?Discharge date: 01/30/2022 ? ?Admission Diagnoses:  ?Principal Problem: ?  S/P total knee arthroplasty, left ? ? ?Discharge Diagnoses:  ?Same ? ?Past Medical History:  ?Diagnosis Date  ? Anemia   ? Anxiety                                                                                                                                                                                                                                                                  ? Arthritis   ? Asthma   ? Breast cancer ([place]) 2015  ? Complication of anesthesia   ? shaky-hard to wake up, blood pressure dropped  ? Dental bridge present   ? Depression   ? Diabetes ([place])   ? Fatty liver   ? GERD (gastroesophageal reflux disease)   ? Hemorrhoids   ? History of kidney stones   ? Hot flashes   ? HTN (hypertension)   ? Hyperlipemia   ? Hypertension   ? Kidney stones   ? Neuropathy   ? Obesity   ? Obstructive sleep apnea   ? Osteoarthritis bilteral knees  ? Personal history of radiation therapy 2015  ? Phobia   ? Plantar fasciitis   ? Radiation 05/14/2014  ? 44 Gy Left breast  ? Wears glasses   ? reading  ? ? ?Surgeries: Procedure(s): ?TOTAL KNEE ARTHROPLASTY on 01/29/2022 ?  ?Consultants:  ? ?Discharged Condition: Improved ? ?Hospital Course: Candice Reid is an 81 y.o. female who was admitted 01/29/2022 for operative treatment ofS/P total knee arthroplasty, left. Patient has severe unremitting pain that affects sleep, daily activities, and work/hobbies. After pre-op clearance the patient was taken to the operating room on 01/29/2022 and underwent  Procedure(s): ?TOTAL KNEE ARTHROPLASTY.   ? ?Patient was given perioperative antibiotics:  ?Anti-infectives (From admission, onward)  ? ? Start     Dose/Rate Route Frequency Ordered Stop  ? 01/29/22 0600  ceFAZolin (ANCEF) IVPB 2g/100 mL premix       ? 2 g ?200 mL/hr over 30 Minutes Intravenous On call to  O.R. 01/29/22 1696 01/29/22 0804  ? ?  ?  ? ?Patient was given sequential compression devices, early ambulation,  and chemoprophylaxis to prevent DVT. ? ?Patient benefited maximally from hospital stay and there were no complications.   ? ?Recent vital signs: Patient Vitals for the past 24 hrs: ? BP Temp Temp src Pulse Resp SpO2  ?01/30/22 0518 (!) 145/51 97.7 ?F (36.5 ?C) Oral 79 18 99 %  ?01/30/22 0134 131/65 97.8 ?F (36.6 ?C) Oral 79 18 100 %  ?01/29/22 2134 (!) 128/51 97.6 ?F (36.4 ?C) Oral 80 18 100 %  ?01/29/22 1817 (!) 125/53 97.9 ?F (36.6 ?C) Oral 86 18 96 %  ?01/29/22 1418 (!) 146/64 97.7 ?F (36.5 ?C) Oral 82 18 99 %  ?01/29/22 1152 (!) 166/90 97.8 ?F (36.6 ?C) Oral 83 16 100 %  ?01/29/22 1100 (!) 168/100 -- -- 85 18 98 %  ?01/29/22 1045 (!) 168/80 -- -- 86 17 97 %  ?01/29/22 1030 (!) 191/103 (!) 97.5 ?F (36.4 ?C) -- 92 20 100 %  ?01/29/22 1017 (!) 181/95 -- -- 88 16 100 %  ?01/29/22 1015 (!) 179/87 -- -- 88 17 98 %  ?01/29/22 1000 (!) 159/95 -- -- 82 17 100 %  ?01/29/22 0945 (!) 174/91 -- -- 85 13 100 %  ?01/29/22 0939 (!) 171/85 97.6 ?F (36.4 ?C) -- 80 20 --  ?  ? ?Recent laboratory studies: No results for input(s): WBC, HGB, HCT, PLT, NA, K, CL, CO2, BUN, CREATININE, GLUCOSE, INR, CALCIUM in the last 72 hours. ? ?Invalid input(s): PT, 2 ? ? ?Discharge Medications:   ?Allergies as of 01/30/2022   ? ?   Reactions  ? Ciprofloxacin   ? unknown  ? Oxycodone-acetaminophen Nausea Only  ? Tyloxapol Nausea And Vomiting  ? Ace Inhibitors Cough  ? Metronidazole Nausea Only  ? Morphine And Related Itching  ? Oxycodone-acetaminophen Nausea Only  ? Septra [sulfamethoxazole-trimethoprim] Nausea Only  ? Sulfa Antibiotics Nausea Only  ? Valium [diazepam] Other (See Comments)  ? depression  ? ?  ? ?  ?Medication List  ?  ? ?STOP taking these medications   ? ?methocarbamol 500 MG tablet ?Commonly known as: ROBAXIN ?  ?traMADol 50 MG tablet ?Commonly known as: ULTRAM ?  ? ?  ? ?TAKE these medications   ? ?acetaminophen 650 MG  CR tablet ?Commonly known as: TYLENOL ?Take 1,300 mg by mouth every 8 (eight) hours as needed for pain. ?  ?ALPRAZolam 0.5 MG tablet ?Commonly known as: Duanne Moron ?Take 0.25-0.5 mg by mouth 3 (three) times daily as needed for anxiety. ?  ?aspirin EC 325 MG tablet ?Take 1 tablet (325 mg total) by mouth 2 (two) times daily. ?  ?BIOTIN PO ?Take 1 tablet by mouth daily. ?  ?cholecalciferol 1000 units tablet ?Commonly known as: VITAMIN D ?Take 1,000 Units by mouth daily. ?  ?docusate sodium 100 MG capsule ?Commonly known as: Colace ?Take 1 capsule (100 mg total) by mouth 2 (two) times daily. ?  ?DULoxetine 60 MG capsule ?Commonly known as: CYMBALTA ?Take 60 mg by mouth daily. ?  ?HYDROmorphone 2 MG tablet ?Commonly known as: Dilaudid ?Take 1 tablet (2 mg total) by mouth every 4 (four) hours as needed for severe pain. ?  ?lansoprazole 30 MG capsule ?Commonly known as: PREVACID ?Take 30 mg by mouth daily. ?  ?LIDOCAINE-MENTHOL ROLL-ON EX ?Apply 1 application. topically daily as needed (shoulder pain). ?  ?metoprolol succinate 100 MG 24 hr tablet ?Commonly known as: TOPROL-XL ?Take 100 mg by mouth daily. ?  ?olmesartan 20 MG tablet ?Commonly known as: BENICAR ?Take 20 mg by mouth daily. ?  ?  Omega-3 1000 MG Caps ?Take 1,000 mg by mouth daily. ?  ?pravastatin 40 MG tablet ?Commonly known as: PRAVACHOL ?Take 40 mg by mouth every Monday, Wednesday, and Friday. ?  ?sitaGLIPtin 100 MG tablet ?Commonly known as: JANUVIA ?Take 100 mg by mouth daily. ?  ?tiZANidine 2 MG tablet ?Commonly known as: ZANAFLEX ?Take 1 tablet (2 mg total) by mouth every 6 (six) hours as needed. ?  ?TURMERIC PO ?Take 1 capsule by mouth daily. ?  ? ?  ? ?  ?  ? ? ?  ?Durable Medical Equipment  ?(From admission, onward)  ?  ? ? ?  ? ?  Start     Ordered  ? 01/29/22 1125  DME Walker rolling  Once       ?Question:  Patient needs a walker to treat with the following condition  Answer:  Status post total left knee replacement  ? 01/29/22 1124  ? 01/29/22 1125  DME 3  n 1  Once       ? 01/29/22 1124  ? ?  ?  ? ?  ? ? ?Diagnostic Studies: DG Chest 2 View ? ?Result Date: 01/27/2022 ?CLINICAL DATA:  Preoperative exam. EXAM: CHEST - 2 VIEW COMPARISON:  Chest x-ray 03/07/2021. FINDINGS: Mediastinum and hilar structures normal. Heart size normal. Mild bibasilar subsegmental atelectasis. No pleural effusion or pneumothorax. Degenerative changes both shoulders. Degenerative changes scoliosis thoracic spine. IMPRESSION: Mild bibasilar subsegmental atelectasis. Exam otherwise unremarkable. Electronically Signed   By: Marcello Moores  Register M.D.   On: 01/27/2022 08:30   ? ?Disposition: Discharge disposition: 01-Home or Self Care ? ? ? ? ? ? ?Discharge Instructions   ? ? Call MD / Call 911   Complete by: As directed ?  ? If you experience chest pain or shortness of breath, CALL 911 and be transported to the hospital emergency room.  If you develope a fever above 101 F, pus (white drainage) or increased drainage or redness at the wound, or calf pain, call your surgeon's office.  ? Constipation Prevention   Complete by: As directed ?  ? Drink plenty of fluids.  Prune juice may be helpful.  You may use a stool softener, such as Colace (over the counter) 100 mg twice a day.  Use MiraLax (over the counter) for constipation as needed.  ? Diet - low sodium heart healthy   Complete by: As directed ?  ? Discharge instructions   Complete by: As directed ?  ? INSTRUCTIONS AFTER JOINT REPLACEMENT  ? ?Remove items at home which could result in a fall. This includes throw rugs or furniture in walking pathways ?ICE to the affected joint every three hours while awake for 30 minutes at a time, for at least the first 3-5 days, and then as needed for pain and swelling.  Continue to use ice for pain and swelling. You may notice swelling that will progress down to the foot and ankle.  This is normal after surgery.  Elevate your leg when you are not up walking on it.   ?Continue to use the breathing machine you got in  the hospital (incentive spirometer) which will help keep your temperature down.  It is common for your temperature to cycle up and down following surgery, especially at night when you are not up moving aro

## 2022-01-30 NOTE — Progress Notes (Addendum)
Physical Therapy Treatment ?Patient Details ?Name: Candice Reid ?MRN: [id number] ?DOB: [birth year] ?Today's Date: 01/30/2022 ? ? 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 ?  ?PT Comments  ? ? Pt with improvement in sensation and pain tolerance this session. Pt transfers to [facility] with min A to power up then able to void bladder, min guard for following reps, good steadiness. Pt ambulates 35 ft with RW, min guard, step to pattern, decreased LLE weight-bearing and stance time, no LOB or increased pain with ambulation. Pt with increased shakiness with ambulation but no unsteadiness, reports normal after surgery and requesting xanax from nurse- RN notified. Pt and pt's daughter educated on HEP and safety with mobility, all questions answered, motivated to return home with daughter assisting as needed. ?  ?Recommendations for follow up therapy are one component of a multi-disciplinary discharge planning process, led by the attending physician.  Recommendations may be updated based on patient status, additional functional criteria and insurance authorization. ? ?Follow Up Recommendations ? Follow physician's recommendations for discharge plan and follow up therapies ?  ?  ?Assistance Recommended at Discharge Set up Supervision/Assistance  ?Patient can return home with the following A little help with bathing/dressing/bathroom;A little help with walking and/or transfers;Assistance with cooking/housework;Assist for transportation;Help with stairs or ramp for entrance ?  ?Equipment Recommendations ? None recommended by PT  ?  ?Recommendations for Other Services   ? ? ?  ?Precautions / Restrictions Precautions ?Precautions: Fall ?Restrictions ?Weight Bearing Restrictions: No  ?  ? ?Mobility ? Bed Mobility ?Overal bed mobility: Needs Assistance ?Bed  Mobility: Supine to Sit ? Supine to sit: Min assist ? General bed mobility comments: pt reaches for therapist's hand to pull up to upright trunk, min A to bring LLE to EOB ?  ? ?Transfers ?Overall transfer level: Needs assistance ?Equipment used: Rolling walker (2 wheels) ?Transfers: Sit to/from Stand ?Sit to Stand: Min assist, Min guard ? General transfer comment: min A for steadying with initial rep, min guard for 4 additional reps, LLE slightly extended for comfort ?  ? ?Ambulation/Gait ?Ambulation/Gait assistance: Min guard ?Gait Distance (Feet): 35 Feet (x2) ?Assistive device: Rolling walker (2 wheels) ?Gait Pattern/deviations: Step-to pattern, Decreased stride length, Decreased stance time - left, Decreased weight shift to left ?Gait velocity: decreased ? General Gait Details: step to pattern, able to clear each foot with short steps and decreased LLE weight-bearing and stance time, good steadiness, becomes shaky with distance without LOB- reports "need my xanax" so notified RN; able to return back to room after seated rest break and improvement in shakiness ? ? ?Stairs ?  ?  ?  ?  ?  ? ? ?Wheelchair Mobility ?  ? ?Modified Rankin (Stroke Patients Only) ?  ? ? ?  ?Balance Overall balance assessment: Needs assistance ?Sitting-balance support: Feet supported ?Sitting balance-Leahy Scale: Good ?Sitting balance - Comments: seated EOB ?  ?Standing balance support: During functional activity, Bilateral upper extremity supported, Reliant on assistive device for balance ?Standing balance-Leahy Scale: Poor ?Standing balance comment: reliant on UE support ?  ?  ?Cognition Arousal/Alertness: Awake/alert ?Behavior During Therapy: [facility] for tasks assessed/performed ?Overall Cognitive Status: Within Functional Limits for tasks assessed ?   ?  ?  ?  ?  ?  ? ?  ?Exercises Total Joint Exercises ?Ankle Circles/Pumps: AROM, Both,  20 reps ? ?  ?General Comments   ?  ?  ? ?Pertinent Vitals/Pain Pain Assessment ?Pain Assessment:  0-10 ?Pain Score: 4  ?Pain Location: L knee and thigh ?Pain Descriptors / Indicators: Aching, Sore, Tender ?Pain Intervention(s): Limited activity within patient's tolerance, Monitored during session, Premedicated before session, Repositioned, Ice applied  ? ? ?Home Living   ?  ?  ?  ?  ?  ?  ?  ?  ?  ?   ?  ?Prior Function    ?  ?  ?   ? ?PT Goals (current goals can now be found in the care plan section) Acute Rehab PT Goals ?Patient Stated Goal: Walk without pain ?PT Goal Formulation: With patient ?Time For Goal Achievement: 02/05/22 ?Potential to Achieve Goals: Good ?Progress towards PT goals: Progressing toward goals ? ?  ?Frequency ? ? ? 7X/week ? ? ? ?  ?PT Plan Current plan remains appropriate  ? ? ?Co-evaluation   ?  ?  ?  ?  ? ?  ?AM-PAC PT "6 Clicks" Mobility   ?Outcome Measure ? Help needed turning from your back to your side while in a flat bed without using bedrails?: None ?Help needed moving from lying on your back to sitting on the side of a flat bed without using bedrails?: A Little ?Help needed moving to and from a bed to a chair (including a wheelchair)?: A Little ?Help needed standing up from a chair using your arms (e.g., wheelchair or bedside chair)?: A Little ?Help needed to walk in hospital room?: A Little ?Help needed climbing 3-5 steps with a railing? : A Lot ?6 Click Score: 18 ? ?  ?End of Session Equipment Utilized During Treatment: Gait belt ?Activity Tolerance: Patient tolerated treatment well ?Patient left: in chair;with call bell/phone within reach;with chair alarm set;with family/visitor present ?Nurse Communication: Mobility status;Other (comment) (xanax) ?PT Visit Diagnosis: Difficulty in walking, not elsewhere classified (R26.2) ?  ? ? ?Time: 5809-9833 ?PT Time Calculation (min) (ACUTE ONLY): 34 min ? ?Charges:  $Gait Training: 8-22 mins ?$Therapeutic Activity: 8-22 mins          ?          ? ? ?Talbot Grumbling PT, DPT ?01/30/22, 12:16 PM ? ? ?

## 2022-01-30 NOTE — Progress Notes (Signed)
Subjective: ?1 Day Post-Op Procedure(s) (LRB): ?TOTAL KNEE ARTHROPLASTY (Left) ? ?Patient resting comfortably in bed this morning. She is hoping to go home today but has not gotten up to walk yet since surgery. ? ?Activity level:  wbat ?Diet tolerance:  ok ?Voiding:  ok ?Patient reports pain as mild.   ? ?Objective: ?Vital signs in last 24 hours: ?Temp:  [97.5 ?F (36.4 ?C)-97.9 ?F (36.6 ?C)] 97.7 ?F (36.5 ?C) (05/06 0518) ?Pulse Rate:  [79-92] 79 (05/06 0518) ?Resp:  [13-20] 18 (05/06 0518) ?BP: (125-191)/(51-103) 145/51 (05/06 0518) ?SpO2:  [96 %-100 %] 99 % (05/06 0518) ? ?Labs: ?No results for input(s): HGB in the last 72 hours. ?No results for input(s): WBC, RBC, HCT, PLT in the last 72 hours. ?No results for input(s): NA, K, CL, CO2, BUN, CREATININE, GLUCOSE, CALCIUM in the last 72 hours. ?No results for input(s): LABPT, INR in the last 72 hours. ? ?Physical Exam: ? Neurologically intact ?ABD soft ?Neurovascular intact ?Sensation intact distally ? ?Assessment/Plan: ? ?1 Day Post-Op Procedure(s) (LRB): ?TOTAL KNEE ARTHROPLASTY (Left) ?Advance diet ?Up with therapy ?D/C IV fluids ?Discharge home with home health ?Continue on asa bid for dvt prevention. ?Follow up in office with Dr. Berenice Primas 2 weeks post op. ?Keep bandage clean and dry until follow up.  ? ?Larwance Sachs Anwar Crill ?01/30/2022, 8:30 AM ? ?

## 2022-01-30 NOTE — Progress Notes (Signed)
The patient is alert and oriented and has been seen by her physician. The orders for discharge were written. IV has been removed. Went over discharge instructions with patient and family. She is being discharged via wheelchair with all of her belongings.  

## 2022-01-31 DIAGNOSIS — M722 Plantar fascial fibromatosis: Secondary | ICD-10-CM | POA: Diagnosis not present

## 2022-01-31 DIAGNOSIS — D649 Anemia, unspecified: Secondary | ICD-10-CM | POA: Diagnosis not present

## 2022-01-31 DIAGNOSIS — K219 Gastro-esophageal reflux disease without esophagitis: Secondary | ICD-10-CM | POA: Diagnosis not present

## 2022-01-31 DIAGNOSIS — Z471 Aftercare following joint replacement surgery: Secondary | ICD-10-CM | POA: Diagnosis not present

## 2022-01-31 DIAGNOSIS — Z853 Personal history of malignant neoplasm of breast: Secondary | ICD-10-CM | POA: Diagnosis not present

## 2022-01-31 DIAGNOSIS — I10 Essential (primary) hypertension: Secondary | ICD-10-CM | POA: Diagnosis not present

## 2022-01-31 DIAGNOSIS — F32A Depression, unspecified: Secondary | ICD-10-CM | POA: Diagnosis not present

## 2022-01-31 DIAGNOSIS — Z96653 Presence of artificial knee joint, bilateral: Secondary | ICD-10-CM | POA: Diagnosis not present

## 2022-01-31 DIAGNOSIS — E1140 Type 2 diabetes mellitus with diabetic neuropathy, unspecified: Secondary | ICD-10-CM | POA: Diagnosis not present

## 2022-01-31 DIAGNOSIS — G4733 Obstructive sleep apnea (adult) (pediatric): Secondary | ICD-10-CM | POA: Diagnosis not present

## 2022-01-31 DIAGNOSIS — J45909 Unspecified asthma, uncomplicated: Secondary | ICD-10-CM | POA: Diagnosis not present

## 2022-01-31 DIAGNOSIS — K5790 Diverticulosis of intestine, part unspecified, without perforation or abscess without bleeding: Secondary | ICD-10-CM | POA: Diagnosis not present

## 2022-01-31 DIAGNOSIS — E785 Hyperlipidemia, unspecified: Secondary | ICD-10-CM | POA: Diagnosis not present

## 2022-01-31 DIAGNOSIS — K760 Fatty (change of) liver, not elsewhere classified: Secondary | ICD-10-CM | POA: Diagnosis not present

## 2022-01-31 DIAGNOSIS — Z7982 Long term (current) use of aspirin: Secondary | ICD-10-CM | POA: Diagnosis not present

## 2022-01-31 DIAGNOSIS — Z7984 Long term (current) use of oral hypoglycemic drugs: Secondary | ICD-10-CM | POA: Diagnosis not present

## 2022-02-01 DIAGNOSIS — E785 Hyperlipidemia, unspecified: Secondary | ICD-10-CM | POA: Diagnosis not present

## 2022-02-01 DIAGNOSIS — Z7984 Long term (current) use of oral hypoglycemic drugs: Secondary | ICD-10-CM | POA: Diagnosis not present

## 2022-02-01 DIAGNOSIS — G4733 Obstructive sleep apnea (adult) (pediatric): Secondary | ICD-10-CM | POA: Diagnosis not present

## 2022-02-01 DIAGNOSIS — K760 Fatty (change of) liver, not elsewhere classified: Secondary | ICD-10-CM | POA: Diagnosis not present

## 2022-02-01 DIAGNOSIS — K5790 Diverticulosis of intestine, part unspecified, without perforation or abscess without bleeding: Secondary | ICD-10-CM | POA: Diagnosis not present

## 2022-02-01 DIAGNOSIS — F32A Depression, unspecified: Secondary | ICD-10-CM | POA: Diagnosis not present

## 2022-02-01 DIAGNOSIS — E1140 Type 2 diabetes mellitus with diabetic neuropathy, unspecified: Secondary | ICD-10-CM | POA: Diagnosis not present

## 2022-02-01 DIAGNOSIS — I10 Essential (primary) hypertension: Secondary | ICD-10-CM | POA: Diagnosis not present

## 2022-02-01 DIAGNOSIS — M722 Plantar fascial fibromatosis: Secondary | ICD-10-CM | POA: Diagnosis not present

## 2022-02-01 DIAGNOSIS — J45909 Unspecified asthma, uncomplicated: Secondary | ICD-10-CM | POA: Diagnosis not present

## 2022-02-01 DIAGNOSIS — D649 Anemia, unspecified: Secondary | ICD-10-CM | POA: Diagnosis not present

## 2022-02-01 DIAGNOSIS — Z471 Aftercare following joint replacement surgery: Secondary | ICD-10-CM | POA: Diagnosis not present

## 2022-02-01 DIAGNOSIS — K219 Gastro-esophageal reflux disease without esophagitis: Secondary | ICD-10-CM | POA: Diagnosis not present

## 2022-02-01 DIAGNOSIS — Z96653 Presence of artificial knee joint, bilateral: Secondary | ICD-10-CM | POA: Diagnosis not present

## 2022-02-01 DIAGNOSIS — Z7982 Long term (current) use of aspirin: Secondary | ICD-10-CM | POA: Diagnosis not present

## 2022-02-01 DIAGNOSIS — Z853 Personal history of malignant neoplasm of breast: Secondary | ICD-10-CM | POA: Diagnosis not present

## 2022-02-02 ENCOUNTER — Encounter (HOSPITAL_COMMUNITY): Payer: Self-pay | Admitting: Orthopedic Surgery

## 2022-02-03 DIAGNOSIS — Z853 Personal history of malignant neoplasm of breast: Secondary | ICD-10-CM | POA: Diagnosis not present

## 2022-02-03 DIAGNOSIS — Z96653 Presence of artificial knee joint, bilateral: Secondary | ICD-10-CM | POA: Diagnosis not present

## 2022-02-03 DIAGNOSIS — F32A Depression, unspecified: Secondary | ICD-10-CM | POA: Diagnosis not present

## 2022-02-03 DIAGNOSIS — Z7982 Long term (current) use of aspirin: Secondary | ICD-10-CM | POA: Diagnosis not present

## 2022-02-03 DIAGNOSIS — K219 Gastro-esophageal reflux disease without esophagitis: Secondary | ICD-10-CM | POA: Diagnosis not present

## 2022-02-03 DIAGNOSIS — J45909 Unspecified asthma, uncomplicated: Secondary | ICD-10-CM | POA: Diagnosis not present

## 2022-02-03 DIAGNOSIS — G4733 Obstructive sleep apnea (adult) (pediatric): Secondary | ICD-10-CM | POA: Diagnosis not present

## 2022-02-03 DIAGNOSIS — M722 Plantar fascial fibromatosis: Secondary | ICD-10-CM | POA: Diagnosis not present

## 2022-02-03 DIAGNOSIS — I10 Essential (primary) hypertension: Secondary | ICD-10-CM | POA: Diagnosis not present

## 2022-02-03 DIAGNOSIS — D649 Anemia, unspecified: Secondary | ICD-10-CM | POA: Diagnosis not present

## 2022-02-03 DIAGNOSIS — K760 Fatty (change of) liver, not elsewhere classified: Secondary | ICD-10-CM | POA: Diagnosis not present

## 2022-02-03 DIAGNOSIS — K5790 Diverticulosis of intestine, part unspecified, without perforation or abscess without bleeding: Secondary | ICD-10-CM | POA: Diagnosis not present

## 2022-02-03 DIAGNOSIS — Z7984 Long term (current) use of oral hypoglycemic drugs: Secondary | ICD-10-CM | POA: Diagnosis not present

## 2022-02-03 DIAGNOSIS — E1140 Type 2 diabetes mellitus with diabetic neuropathy, unspecified: Secondary | ICD-10-CM | POA: Diagnosis not present

## 2022-02-03 DIAGNOSIS — E785 Hyperlipidemia, unspecified: Secondary | ICD-10-CM | POA: Diagnosis not present

## 2022-02-03 DIAGNOSIS — Z471 Aftercare following joint replacement surgery: Secondary | ICD-10-CM | POA: Diagnosis not present

## 2022-02-05 DIAGNOSIS — K219 Gastro-esophageal reflux disease without esophagitis: Secondary | ICD-10-CM | POA: Diagnosis not present

## 2022-02-05 DIAGNOSIS — D649 Anemia, unspecified: Secondary | ICD-10-CM | POA: Diagnosis not present

## 2022-02-05 DIAGNOSIS — J45909 Unspecified asthma, uncomplicated: Secondary | ICD-10-CM | POA: Diagnosis not present

## 2022-02-05 DIAGNOSIS — Z7982 Long term (current) use of aspirin: Secondary | ICD-10-CM | POA: Diagnosis not present

## 2022-02-05 DIAGNOSIS — E785 Hyperlipidemia, unspecified: Secondary | ICD-10-CM | POA: Diagnosis not present

## 2022-02-05 DIAGNOSIS — E1140 Type 2 diabetes mellitus with diabetic neuropathy, unspecified: Secondary | ICD-10-CM | POA: Diagnosis not present

## 2022-02-05 DIAGNOSIS — K5790 Diverticulosis of intestine, part unspecified, without perforation or abscess without bleeding: Secondary | ICD-10-CM | POA: Diagnosis not present

## 2022-02-05 DIAGNOSIS — K760 Fatty (change of) liver, not elsewhere classified: Secondary | ICD-10-CM | POA: Diagnosis not present

## 2022-02-05 DIAGNOSIS — Z7984 Long term (current) use of oral hypoglycemic drugs: Secondary | ICD-10-CM | POA: Diagnosis not present

## 2022-02-05 DIAGNOSIS — Z471 Aftercare following joint replacement surgery: Secondary | ICD-10-CM | POA: Diagnosis not present

## 2022-02-05 DIAGNOSIS — Z853 Personal history of malignant neoplasm of breast: Secondary | ICD-10-CM | POA: Diagnosis not present

## 2022-02-05 DIAGNOSIS — G4733 Obstructive sleep apnea (adult) (pediatric): Secondary | ICD-10-CM | POA: Diagnosis not present

## 2022-02-05 DIAGNOSIS — I10 Essential (primary) hypertension: Secondary | ICD-10-CM | POA: Diagnosis not present

## 2022-02-05 DIAGNOSIS — M722 Plantar fascial fibromatosis: Secondary | ICD-10-CM | POA: Diagnosis not present

## 2022-02-05 DIAGNOSIS — Z96653 Presence of artificial knee joint, bilateral: Secondary | ICD-10-CM | POA: Diagnosis not present

## 2022-02-05 DIAGNOSIS — F32A Depression, unspecified: Secondary | ICD-10-CM | POA: Diagnosis not present

## 2022-02-08 DIAGNOSIS — Z471 Aftercare following joint replacement surgery: Secondary | ICD-10-CM | POA: Diagnosis not present

## 2022-02-08 DIAGNOSIS — F32A Depression, unspecified: Secondary | ICD-10-CM | POA: Diagnosis not present

## 2022-02-08 DIAGNOSIS — Z853 Personal history of malignant neoplasm of breast: Secondary | ICD-10-CM | POA: Diagnosis not present

## 2022-02-08 DIAGNOSIS — Z7982 Long term (current) use of aspirin: Secondary | ICD-10-CM | POA: Diagnosis not present

## 2022-02-08 DIAGNOSIS — E785 Hyperlipidemia, unspecified: Secondary | ICD-10-CM | POA: Diagnosis not present

## 2022-02-08 DIAGNOSIS — K5790 Diverticulosis of intestine, part unspecified, without perforation or abscess without bleeding: Secondary | ICD-10-CM | POA: Diagnosis not present

## 2022-02-08 DIAGNOSIS — K219 Gastro-esophageal reflux disease without esophagitis: Secondary | ICD-10-CM | POA: Diagnosis not present

## 2022-02-08 DIAGNOSIS — E1140 Type 2 diabetes mellitus with diabetic neuropathy, unspecified: Secondary | ICD-10-CM | POA: Diagnosis not present

## 2022-02-08 DIAGNOSIS — G4733 Obstructive sleep apnea (adult) (pediatric): Secondary | ICD-10-CM | POA: Diagnosis not present

## 2022-02-08 DIAGNOSIS — K760 Fatty (change of) liver, not elsewhere classified: Secondary | ICD-10-CM | POA: Diagnosis not present

## 2022-02-08 DIAGNOSIS — Z96653 Presence of artificial knee joint, bilateral: Secondary | ICD-10-CM | POA: Diagnosis not present

## 2022-02-08 DIAGNOSIS — I10 Essential (primary) hypertension: Secondary | ICD-10-CM | POA: Diagnosis not present

## 2022-02-08 DIAGNOSIS — M722 Plantar fascial fibromatosis: Secondary | ICD-10-CM | POA: Diagnosis not present

## 2022-02-08 DIAGNOSIS — D649 Anemia, unspecified: Secondary | ICD-10-CM | POA: Diagnosis not present

## 2022-02-08 DIAGNOSIS — Z7984 Long term (current) use of oral hypoglycemic drugs: Secondary | ICD-10-CM | POA: Diagnosis not present

## 2022-02-08 DIAGNOSIS — J45909 Unspecified asthma, uncomplicated: Secondary | ICD-10-CM | POA: Diagnosis not present

## 2022-02-10 DIAGNOSIS — E785 Hyperlipidemia, unspecified: Secondary | ICD-10-CM | POA: Diagnosis not present

## 2022-02-10 DIAGNOSIS — K219 Gastro-esophageal reflux disease without esophagitis: Secondary | ICD-10-CM | POA: Diagnosis not present

## 2022-02-10 DIAGNOSIS — D649 Anemia, unspecified: Secondary | ICD-10-CM | POA: Diagnosis not present

## 2022-02-10 DIAGNOSIS — F32A Depression, unspecified: Secondary | ICD-10-CM | POA: Diagnosis not present

## 2022-02-10 DIAGNOSIS — Z7982 Long term (current) use of aspirin: Secondary | ICD-10-CM | POA: Diagnosis not present

## 2022-02-10 DIAGNOSIS — Z96653 Presence of artificial knee joint, bilateral: Secondary | ICD-10-CM | POA: Diagnosis not present

## 2022-02-10 DIAGNOSIS — Z471 Aftercare following joint replacement surgery: Secondary | ICD-10-CM | POA: Diagnosis not present

## 2022-02-10 DIAGNOSIS — K5790 Diverticulosis of intestine, part unspecified, without perforation or abscess without bleeding: Secondary | ICD-10-CM | POA: Diagnosis not present

## 2022-02-10 DIAGNOSIS — J45909 Unspecified asthma, uncomplicated: Secondary | ICD-10-CM | POA: Diagnosis not present

## 2022-02-10 DIAGNOSIS — M722 Plantar fascial fibromatosis: Secondary | ICD-10-CM | POA: Diagnosis not present

## 2022-02-10 DIAGNOSIS — G4733 Obstructive sleep apnea (adult) (pediatric): Secondary | ICD-10-CM | POA: Diagnosis not present

## 2022-02-10 DIAGNOSIS — E1140 Type 2 diabetes mellitus with diabetic neuropathy, unspecified: Secondary | ICD-10-CM | POA: Diagnosis not present

## 2022-02-10 DIAGNOSIS — Z853 Personal history of malignant neoplasm of breast: Secondary | ICD-10-CM | POA: Diagnosis not present

## 2022-02-10 DIAGNOSIS — K760 Fatty (change of) liver, not elsewhere classified: Secondary | ICD-10-CM | POA: Diagnosis not present

## 2022-02-10 DIAGNOSIS — Z7984 Long term (current) use of oral hypoglycemic drugs: Secondary | ICD-10-CM | POA: Diagnosis not present

## 2022-02-10 DIAGNOSIS — I10 Essential (primary) hypertension: Secondary | ICD-10-CM | POA: Diagnosis not present

## 2022-02-11 DIAGNOSIS — M25662 Stiffness of left knee, not elsewhere classified: Secondary | ICD-10-CM | POA: Diagnosis not present

## 2022-02-11 DIAGNOSIS — M25562 Pain in left knee: Secondary | ICD-10-CM | POA: Diagnosis not present

## 2022-02-11 DIAGNOSIS — M1712 Unilateral primary osteoarthritis, left knee: Secondary | ICD-10-CM | POA: Diagnosis not present

## 2022-02-11 DIAGNOSIS — R531 Weakness: Secondary | ICD-10-CM | POA: Diagnosis not present

## 2022-02-11 DIAGNOSIS — Z9889 Other specified postprocedural states: Secondary | ICD-10-CM | POA: Diagnosis not present

## 2022-02-17 DIAGNOSIS — M25562 Pain in left knee: Secondary | ICD-10-CM | POA: Diagnosis not present

## 2022-02-17 DIAGNOSIS — R531 Weakness: Secondary | ICD-10-CM | POA: Diagnosis not present

## 2022-02-17 DIAGNOSIS — Z9889 Other specified postprocedural states: Secondary | ICD-10-CM | POA: Diagnosis not present

## 2022-02-17 DIAGNOSIS — M25662 Stiffness of left knee, not elsewhere classified: Secondary | ICD-10-CM | POA: Diagnosis not present

## 2022-02-19 DIAGNOSIS — M25662 Stiffness of left knee, not elsewhere classified: Secondary | ICD-10-CM | POA: Diagnosis not present

## 2022-02-19 DIAGNOSIS — R531 Weakness: Secondary | ICD-10-CM | POA: Diagnosis not present

## 2022-02-19 DIAGNOSIS — M25562 Pain in left knee: Secondary | ICD-10-CM | POA: Diagnosis not present

## 2022-02-19 DIAGNOSIS — Z9889 Other specified postprocedural states: Secondary | ICD-10-CM | POA: Diagnosis not present

## 2022-02-24 DIAGNOSIS — M25562 Pain in left knee: Secondary | ICD-10-CM | POA: Diagnosis not present

## 2022-02-24 DIAGNOSIS — M25662 Stiffness of left knee, not elsewhere classified: Secondary | ICD-10-CM | POA: Diagnosis not present

## 2022-02-24 DIAGNOSIS — Z9889 Other specified postprocedural states: Secondary | ICD-10-CM | POA: Diagnosis not present

## 2022-02-24 DIAGNOSIS — R531 Weakness: Secondary | ICD-10-CM | POA: Diagnosis not present

## 2022-03-03 DIAGNOSIS — Z9889 Other specified postprocedural states: Secondary | ICD-10-CM | POA: Diagnosis not present

## 2022-03-03 DIAGNOSIS — R531 Weakness: Secondary | ICD-10-CM | POA: Diagnosis not present

## 2022-03-03 DIAGNOSIS — M25662 Stiffness of left knee, not elsewhere classified: Secondary | ICD-10-CM | POA: Diagnosis not present

## 2022-03-03 DIAGNOSIS — M25562 Pain in left knee: Secondary | ICD-10-CM | POA: Diagnosis not present

## 2022-03-05 DIAGNOSIS — Z9889 Other specified postprocedural states: Secondary | ICD-10-CM | POA: Diagnosis not present

## 2022-03-05 DIAGNOSIS — R531 Weakness: Secondary | ICD-10-CM | POA: Diagnosis not present

## 2022-03-05 DIAGNOSIS — M25562 Pain in left knee: Secondary | ICD-10-CM | POA: Diagnosis not present

## 2022-03-05 DIAGNOSIS — M25662 Stiffness of left knee, not elsewhere classified: Secondary | ICD-10-CM | POA: Diagnosis not present

## 2022-03-10 DIAGNOSIS — M25562 Pain in left knee: Secondary | ICD-10-CM | POA: Diagnosis not present

## 2022-03-10 DIAGNOSIS — M25662 Stiffness of left knee, not elsewhere classified: Secondary | ICD-10-CM | POA: Diagnosis not present

## 2022-03-10 DIAGNOSIS — Z9889 Other specified postprocedural states: Secondary | ICD-10-CM | POA: Diagnosis not present

## 2022-03-10 DIAGNOSIS — R531 Weakness: Secondary | ICD-10-CM | POA: Diagnosis not present

## 2022-03-12 DIAGNOSIS — R531 Weakness: Secondary | ICD-10-CM | POA: Diagnosis not present

## 2022-03-12 DIAGNOSIS — M25662 Stiffness of left knee, not elsewhere classified: Secondary | ICD-10-CM | POA: Diagnosis not present

## 2022-03-12 DIAGNOSIS — M25562 Pain in left knee: Secondary | ICD-10-CM | POA: Diagnosis not present

## 2022-03-12 DIAGNOSIS — Z9889 Other specified postprocedural states: Secondary | ICD-10-CM | POA: Diagnosis not present

## 2022-03-17 DIAGNOSIS — M25562 Pain in left knee: Secondary | ICD-10-CM | POA: Diagnosis not present

## 2022-03-17 DIAGNOSIS — M25662 Stiffness of left knee, not elsewhere classified: Secondary | ICD-10-CM | POA: Diagnosis not present

## 2022-03-17 DIAGNOSIS — R531 Weakness: Secondary | ICD-10-CM | POA: Diagnosis not present

## 2022-03-17 DIAGNOSIS — Z9889 Other specified postprocedural states: Secondary | ICD-10-CM | POA: Diagnosis not present

## 2022-03-19 DIAGNOSIS — R531 Weakness: Secondary | ICD-10-CM | POA: Diagnosis not present

## 2022-03-19 DIAGNOSIS — M25562 Pain in left knee: Secondary | ICD-10-CM | POA: Diagnosis not present

## 2022-03-19 DIAGNOSIS — M25662 Stiffness of left knee, not elsewhere classified: Secondary | ICD-10-CM | POA: Diagnosis not present

## 2022-03-19 DIAGNOSIS — Z9889 Other specified postprocedural states: Secondary | ICD-10-CM | POA: Diagnosis not present

## 2022-03-22 DIAGNOSIS — R531 Weakness: Secondary | ICD-10-CM | POA: Diagnosis not present

## 2022-03-22 DIAGNOSIS — M25562 Pain in left knee: Secondary | ICD-10-CM | POA: Diagnosis not present

## 2022-03-22 DIAGNOSIS — M25662 Stiffness of left knee, not elsewhere classified: Secondary | ICD-10-CM | POA: Diagnosis not present

## 2022-03-22 DIAGNOSIS — Z9889 Other specified postprocedural states: Secondary | ICD-10-CM | POA: Diagnosis not present

## 2022-03-24 DIAGNOSIS — R531 Weakness: Secondary | ICD-10-CM | POA: Diagnosis not present

## 2022-03-24 DIAGNOSIS — Z9889 Other specified postprocedural states: Secondary | ICD-10-CM | POA: Diagnosis not present

## 2022-03-24 DIAGNOSIS — M25562 Pain in left knee: Secondary | ICD-10-CM | POA: Diagnosis not present

## 2022-03-24 DIAGNOSIS — M25662 Stiffness of left knee, not elsewhere classified: Secondary | ICD-10-CM | POA: Diagnosis not present

## 2022-04-22 ENCOUNTER — Other Ambulatory Visit: Payer: Self-pay | Admitting: Physician Assistant

## 2022-04-22 ENCOUNTER — Ambulatory Visit
Admission: RE | Admit: 2022-04-22 | Discharge: 2022-04-22 | Disposition: A | Discharge status: Home / Self Care | Payer: Medicare Other | Source: Ambulatory Visit | Attending: Physician Assistant | Admitting: Physician Assistant

## 2022-04-22 DIAGNOSIS — M25532 Pain in left wrist: Secondary | ICD-10-CM | POA: Diagnosis not present

## 2022-04-22 DIAGNOSIS — M545 Low back pain, unspecified: Secondary | ICD-10-CM | POA: Diagnosis not present

## 2022-05-03 DIAGNOSIS — M25512 Pain in left shoulder: Secondary | ICD-10-CM | POA: Diagnosis not present

## 2022-05-05 DIAGNOSIS — M19032 Primary osteoarthritis, left wrist: Secondary | ICD-10-CM | POA: Diagnosis not present

## 2022-05-05 DIAGNOSIS — M1812 Unilateral primary osteoarthritis of first carpometacarpal joint, left hand: Secondary | ICD-10-CM | POA: Diagnosis not present

## 2022-05-06 DIAGNOSIS — M25612 Stiffness of left shoulder, not elsewhere classified: Secondary | ICD-10-CM | POA: Diagnosis not present

## 2022-05-06 DIAGNOSIS — M6281 Muscle weakness (generalized): Secondary | ICD-10-CM | POA: Diagnosis not present

## 2022-05-06 DIAGNOSIS — M75122 Complete rotator cuff tear or rupture of left shoulder, not specified as traumatic: Secondary | ICD-10-CM | POA: Diagnosis not present

## 2022-05-14 DIAGNOSIS — M6281 Muscle weakness (generalized): Secondary | ICD-10-CM | POA: Diagnosis not present

## 2022-05-14 DIAGNOSIS — M25612 Stiffness of left shoulder, not elsewhere classified: Secondary | ICD-10-CM | POA: Diagnosis not present

## 2022-05-14 DIAGNOSIS — M75122 Complete rotator cuff tear or rupture of left shoulder, not specified as traumatic: Secondary | ICD-10-CM | POA: Diagnosis not present

## 2022-05-21 DIAGNOSIS — M6281 Muscle weakness (generalized): Secondary | ICD-10-CM | POA: Diagnosis not present

## 2022-05-21 DIAGNOSIS — M25612 Stiffness of left shoulder, not elsewhere classified: Secondary | ICD-10-CM | POA: Diagnosis not present

## 2022-05-21 DIAGNOSIS — M75122 Complete rotator cuff tear or rupture of left shoulder, not specified as traumatic: Secondary | ICD-10-CM | POA: Diagnosis not present

## 2022-05-24 DIAGNOSIS — N390 Urinary tract infection, site not specified: Secondary | ICD-10-CM | POA: Diagnosis not present

## 2022-05-28 DIAGNOSIS — M25612 Stiffness of left shoulder, not elsewhere classified: Secondary | ICD-10-CM | POA: Diagnosis not present

## 2022-05-28 DIAGNOSIS — M6281 Muscle weakness (generalized): Secondary | ICD-10-CM | POA: Diagnosis not present

## 2022-05-28 DIAGNOSIS — M75122 Complete rotator cuff tear or rupture of left shoulder, not specified as traumatic: Secondary | ICD-10-CM | POA: Diagnosis not present

## 2022-06-04 DIAGNOSIS — M25612 Stiffness of left shoulder, not elsewhere classified: Secondary | ICD-10-CM | POA: Diagnosis not present

## 2022-06-04 DIAGNOSIS — M6281 Muscle weakness (generalized): Secondary | ICD-10-CM | POA: Diagnosis not present

## 2022-06-04 DIAGNOSIS — M75122 Complete rotator cuff tear or rupture of left shoulder, not specified as traumatic: Secondary | ICD-10-CM | POA: Diagnosis not present

## 2022-06-11 DIAGNOSIS — M75122 Complete rotator cuff tear or rupture of left shoulder, not specified as traumatic: Secondary | ICD-10-CM | POA: Diagnosis not present

## 2022-06-11 DIAGNOSIS — M25612 Stiffness of left shoulder, not elsewhere classified: Secondary | ICD-10-CM | POA: Diagnosis not present

## 2022-06-11 DIAGNOSIS — M6281 Muscle weakness (generalized): Secondary | ICD-10-CM | POA: Diagnosis not present

## 2022-06-18 DIAGNOSIS — M6281 Muscle weakness (generalized): Secondary | ICD-10-CM | POA: Diagnosis not present

## 2022-06-18 DIAGNOSIS — M75122 Complete rotator cuff tear or rupture of left shoulder, not specified as traumatic: Secondary | ICD-10-CM | POA: Diagnosis not present

## 2022-06-18 DIAGNOSIS — M25612 Stiffness of left shoulder, not elsewhere classified: Secondary | ICD-10-CM | POA: Diagnosis not present

## 2022-06-30 DIAGNOSIS — E119 Type 2 diabetes mellitus without complications: Secondary | ICD-10-CM | POA: Diagnosis not present

## 2022-06-30 DIAGNOSIS — H4322 Crystalline deposits in vitreous body, left eye: Secondary | ICD-10-CM | POA: Diagnosis not present

## 2022-06-30 DIAGNOSIS — H43812 Vitreous degeneration, left eye: Secondary | ICD-10-CM | POA: Diagnosis not present

## 2022-06-30 DIAGNOSIS — H25813 Combined forms of age-related cataract, bilateral: Secondary | ICD-10-CM | POA: Diagnosis not present

## 2022-07-02 DIAGNOSIS — M75122 Complete rotator cuff tear or rupture of left shoulder, not specified as traumatic: Secondary | ICD-10-CM | POA: Diagnosis not present

## 2022-07-02 DIAGNOSIS — M25612 Stiffness of left shoulder, not elsewhere classified: Secondary | ICD-10-CM | POA: Diagnosis not present

## 2022-07-02 DIAGNOSIS — M6281 Muscle weakness (generalized): Secondary | ICD-10-CM | POA: Diagnosis not present

## 2022-07-13 DIAGNOSIS — M7502 Adhesive capsulitis of left shoulder: Secondary | ICD-10-CM | POA: Diagnosis not present

## 2022-07-13 DIAGNOSIS — Z96652 Presence of left artificial knee joint: Secondary | ICD-10-CM | POA: Diagnosis not present

## 2022-07-13 DIAGNOSIS — M75122 Complete rotator cuff tear or rupture of left shoulder, not specified as traumatic: Secondary | ICD-10-CM | POA: Diagnosis not present

## 2022-07-16 ENCOUNTER — Ambulatory Visit
Admission: RE | Admit: 2022-07-16 | Discharge: 2022-07-16 | Disposition: A | Discharge status: Home / Self Care | Payer: Medicare Other | Source: Ambulatory Visit | Attending: Family Medicine | Admitting: Family Medicine

## 2022-07-16 DIAGNOSIS — Z780 Asymptomatic menopausal state: Secondary | ICD-10-CM | POA: Diagnosis not present

## 2022-07-16 DIAGNOSIS — M85832 Other specified disorders of bone density and structure, left forearm: Secondary | ICD-10-CM | POA: Diagnosis not present

## 2022-07-16 DIAGNOSIS — M8580 Other specified disorders of bone density and structure, unspecified site: Secondary | ICD-10-CM

## 2022-07-19 DIAGNOSIS — M25612 Stiffness of left shoulder, not elsewhere classified: Secondary | ICD-10-CM | POA: Diagnosis not present

## 2022-07-19 DIAGNOSIS — M75122 Complete rotator cuff tear or rupture of left shoulder, not specified as traumatic: Secondary | ICD-10-CM | POA: Diagnosis not present

## 2022-07-19 DIAGNOSIS — M6281 Muscle weakness (generalized): Secondary | ICD-10-CM | POA: Diagnosis not present

## 2022-07-21 ENCOUNTER — Other Ambulatory Visit: Payer: Self-pay | Admitting: Family Medicine

## 2022-07-21 DIAGNOSIS — N6310 Unspecified lump in the right breast, unspecified quadrant: Secondary | ICD-10-CM

## 2022-07-26 ENCOUNTER — Ambulatory Visit
Admission: RE | Admit: 2022-07-26 | Discharge: 2022-07-26 | Disposition: A | Discharge status: Home / Self Care | Payer: Medicare Other | Source: Ambulatory Visit | Attending: Family Medicine | Admitting: Family Medicine

## 2022-07-26 ENCOUNTER — Other Ambulatory Visit: Payer: Self-pay | Admitting: Family Medicine

## 2022-07-26 DIAGNOSIS — R92321 Mammographic fibroglandular density, right breast: Secondary | ICD-10-CM | POA: Diagnosis not present

## 2022-07-26 DIAGNOSIS — Z1231 Encounter for screening mammogram for malignant neoplasm of breast: Secondary | ICD-10-CM

## 2022-07-26 DIAGNOSIS — N6310 Unspecified lump in the right breast, unspecified quadrant: Secondary | ICD-10-CM

## 2022-07-26 DIAGNOSIS — N6311 Unspecified lump in the right breast, upper outer quadrant: Secondary | ICD-10-CM | POA: Diagnosis not present

## 2022-07-28 DIAGNOSIS — E1151 Type 2 diabetes mellitus with diabetic peripheral angiopathy without gangrene: Secondary | ICD-10-CM | POA: Diagnosis not present

## 2022-07-28 DIAGNOSIS — E782 Mixed hyperlipidemia: Secondary | ICD-10-CM | POA: Diagnosis not present

## 2022-07-28 DIAGNOSIS — N3020 Other chronic cystitis without hematuria: Secondary | ICD-10-CM | POA: Diagnosis not present

## 2022-07-28 DIAGNOSIS — I739 Peripheral vascular disease, unspecified: Secondary | ICD-10-CM | POA: Diagnosis not present

## 2022-07-28 DIAGNOSIS — I10 Essential (primary) hypertension: Secondary | ICD-10-CM | POA: Diagnosis not present

## 2022-07-28 DIAGNOSIS — Z0000 Encounter for general adult medical examination without abnormal findings: Secondary | ICD-10-CM | POA: Diagnosis not present

## 2022-07-28 DIAGNOSIS — Z23 Encounter for immunization: Secondary | ICD-10-CM | POA: Diagnosis not present

## 2022-07-28 DIAGNOSIS — K219 Gastro-esophageal reflux disease without esophagitis: Secondary | ICD-10-CM | POA: Diagnosis not present

## 2022-08-04 DIAGNOSIS — M6281 Muscle weakness (generalized): Secondary | ICD-10-CM | POA: Diagnosis not present

## 2022-08-04 DIAGNOSIS — M25612 Stiffness of left shoulder, not elsewhere classified: Secondary | ICD-10-CM | POA: Diagnosis not present

## 2022-08-04 DIAGNOSIS — M75122 Complete rotator cuff tear or rupture of left shoulder, not specified as traumatic: Secondary | ICD-10-CM | POA: Diagnosis not present

## 2022-08-31 DIAGNOSIS — M75122 Complete rotator cuff tear or rupture of left shoulder, not specified as traumatic: Secondary | ICD-10-CM | POA: Diagnosis not present

## 2022-08-31 DIAGNOSIS — M25562 Pain in left knee: Secondary | ICD-10-CM | POA: Diagnosis not present

## 2022-10-08 DIAGNOSIS — E782 Mixed hyperlipidemia: Secondary | ICD-10-CM | POA: Diagnosis not present

## 2022-10-18 DIAGNOSIS — L820 Inflamed seborrheic keratosis: Secondary | ICD-10-CM | POA: Diagnosis not present

## 2022-10-18 DIAGNOSIS — L538 Other specified erythematous conditions: Secondary | ICD-10-CM | POA: Diagnosis not present

## 2022-10-18 DIAGNOSIS — L821 Other seborrheic keratosis: Secondary | ICD-10-CM | POA: Diagnosis not present

## 2022-10-18 DIAGNOSIS — L298 Other pruritus: Secondary | ICD-10-CM | POA: Diagnosis not present

## 2022-10-18 DIAGNOSIS — D485 Neoplasm of uncertain behavior of skin: Secondary | ICD-10-CM | POA: Diagnosis not present

## 2022-10-18 DIAGNOSIS — Z789 Other specified health status: Secondary | ICD-10-CM | POA: Diagnosis not present

## 2022-10-27 DIAGNOSIS — L570 Actinic keratosis: Secondary | ICD-10-CM | POA: Diagnosis not present

## 2022-11-04 ENCOUNTER — Ambulatory Visit: Payer: Medicare Other

## 2022-11-05 ENCOUNTER — Ambulatory Visit
Admission: RE | Admit: 2022-11-05 | Discharge: 2022-11-05 | Disposition: A | Discharge status: Home / Self Care | Payer: Medicare Other | Source: Ambulatory Visit | Attending: Family Medicine | Admitting: Family Medicine

## 2022-11-05 DIAGNOSIS — Z1231 Encounter for screening mammogram for malignant neoplasm of breast: Secondary | ICD-10-CM | POA: Diagnosis not present

## 2022-11-11 DIAGNOSIS — E538 Deficiency of other specified B group vitamins: Secondary | ICD-10-CM | POA: Diagnosis not present

## 2022-11-11 DIAGNOSIS — M25561 Pain in right knee: Secondary | ICD-10-CM | POA: Diagnosis not present

## 2022-11-11 DIAGNOSIS — R29898 Other symptoms and signs involving the musculoskeletal system: Secondary | ICD-10-CM | POA: Diagnosis not present

## 2022-11-11 DIAGNOSIS — M25562 Pain in left knee: Secondary | ICD-10-CM | POA: Diagnosis not present

## 2022-11-11 DIAGNOSIS — R202 Paresthesia of skin: Secondary | ICD-10-CM | POA: Diagnosis not present

## 2022-11-11 DIAGNOSIS — M79642 Pain in left hand: Secondary | ICD-10-CM | POA: Diagnosis not present

## 2022-11-11 DIAGNOSIS — D649 Anemia, unspecified: Secondary | ICD-10-CM | POA: Diagnosis not present

## 2022-11-19 DIAGNOSIS — E538 Deficiency of other specified B group vitamins: Secondary | ICD-10-CM | POA: Diagnosis not present

## 2022-11-23 DIAGNOSIS — M79642 Pain in left hand: Secondary | ICD-10-CM | POA: Diagnosis not present

## 2022-11-26 DIAGNOSIS — E538 Deficiency of other specified B group vitamins: Secondary | ICD-10-CM | POA: Diagnosis not present

## 2022-12-03 DIAGNOSIS — E538 Deficiency of other specified B group vitamins: Secondary | ICD-10-CM | POA: Diagnosis not present

## 2022-12-10 DIAGNOSIS — E538 Deficiency of other specified B group vitamins: Secondary | ICD-10-CM | POA: Diagnosis not present

## 2022-12-13 DIAGNOSIS — G5603 Carpal tunnel syndrome, bilateral upper limbs: Secondary | ICD-10-CM | POA: Diagnosis not present

## 2023-01-06 DIAGNOSIS — G5603 Carpal tunnel syndrome, bilateral upper limbs: Secondary | ICD-10-CM | POA: Diagnosis not present

## 2023-01-06 DIAGNOSIS — G5601 Carpal tunnel syndrome, right upper limb: Secondary | ICD-10-CM | POA: Diagnosis not present

## 2023-01-06 DIAGNOSIS — G5602 Carpal tunnel syndrome, left upper limb: Secondary | ICD-10-CM | POA: Diagnosis not present

## 2023-01-26 DIAGNOSIS — I10 Essential (primary) hypertension: Secondary | ICD-10-CM | POA: Diagnosis not present

## 2023-01-26 DIAGNOSIS — E119 Type 2 diabetes mellitus without complications: Secondary | ICD-10-CM | POA: Diagnosis not present

## 2023-01-26 DIAGNOSIS — M7918 Myalgia, other site: Secondary | ICD-10-CM | POA: Diagnosis not present

## 2023-01-26 DIAGNOSIS — M792 Neuralgia and neuritis, unspecified: Secondary | ICD-10-CM | POA: Diagnosis not present

## 2023-01-26 DIAGNOSIS — E782 Mixed hyperlipidemia: Secondary | ICD-10-CM | POA: Diagnosis not present

## 2023-01-26 DIAGNOSIS — E118 Type 2 diabetes mellitus with unspecified complications: Secondary | ICD-10-CM | POA: Diagnosis not present

## 2023-02-05 ENCOUNTER — Encounter (HOSPITAL_BASED_OUTPATIENT_CLINIC_OR_DEPARTMENT_OTHER): Payer: Self-pay | Admitting: Emergency Medicine

## 2023-02-05 ENCOUNTER — Observation Stay (HOSPITAL_BASED_OUTPATIENT_CLINIC_OR_DEPARTMENT_OTHER)
Admission: EM | Admit: 2023-02-05 | Discharge: 2023-02-06 | Disposition: A | Discharge status: Home / Self Care | Payer: Medicare Other | Attending: Student | Admitting: Student

## 2023-02-05 ENCOUNTER — Emergency Department (HOSPITAL_BASED_OUTPATIENT_CLINIC_OR_DEPARTMENT_OTHER): Payer: Medicare Other

## 2023-02-05 ENCOUNTER — Other Ambulatory Visit: Payer: Self-pay

## 2023-02-05 DIAGNOSIS — G629 Polyneuropathy, unspecified: Secondary | ICD-10-CM | POA: Diagnosis not present

## 2023-02-05 DIAGNOSIS — Z96653 Presence of artificial knee joint, bilateral: Secondary | ICD-10-CM | POA: Insufficient documentation

## 2023-02-05 DIAGNOSIS — G4733 Obstructive sleep apnea (adult) (pediatric): Secondary | ICD-10-CM | POA: Insufficient documentation

## 2023-02-05 DIAGNOSIS — Z853 Personal history of malignant neoplasm of breast: Secondary | ICD-10-CM | POA: Diagnosis not present

## 2023-02-05 DIAGNOSIS — Z7982 Long term (current) use of aspirin: Secondary | ICD-10-CM | POA: Diagnosis not present

## 2023-02-05 DIAGNOSIS — R202 Paresthesia of skin: Secondary | ICD-10-CM | POA: Diagnosis not present

## 2023-02-05 DIAGNOSIS — M545 Low back pain, unspecified: Secondary | ICD-10-CM | POA: Diagnosis not present

## 2023-02-05 DIAGNOSIS — Z79899 Other long term (current) drug therapy: Secondary | ICD-10-CM | POA: Diagnosis not present

## 2023-02-05 DIAGNOSIS — E1165 Type 2 diabetes mellitus with hyperglycemia: Secondary | ICD-10-CM

## 2023-02-05 DIAGNOSIS — R29701 NIHSS score 1: Secondary | ICD-10-CM | POA: Insufficient documentation

## 2023-02-05 DIAGNOSIS — R200 Anesthesia of skin: Secondary | ICD-10-CM | POA: Diagnosis not present

## 2023-02-05 DIAGNOSIS — I10 Essential (primary) hypertension: Secondary | ICD-10-CM | POA: Diagnosis not present

## 2023-02-05 DIAGNOSIS — J45909 Unspecified asthma, uncomplicated: Secondary | ICD-10-CM | POA: Insufficient documentation

## 2023-02-05 DIAGNOSIS — I639 Cerebral infarction, unspecified: Secondary | ICD-10-CM | POA: Diagnosis not present

## 2023-02-05 DIAGNOSIS — R9431 Abnormal electrocardiogram [ECG] [EKG]: Secondary | ICD-10-CM | POA: Diagnosis not present

## 2023-02-05 DIAGNOSIS — E119 Type 2 diabetes mellitus without complications: Secondary | ICD-10-CM | POA: Insufficient documentation

## 2023-02-05 DIAGNOSIS — K219 Gastro-esophageal reflux disease without esophagitis: Secondary | ICD-10-CM

## 2023-02-05 DIAGNOSIS — G8929 Other chronic pain: Secondary | ICD-10-CM | POA: Diagnosis not present

## 2023-02-05 DIAGNOSIS — E785 Hyperlipidemia, unspecified: Secondary | ICD-10-CM

## 2023-02-05 LAB — CBC WITH DIFFERENTIAL/PLATELET
Abs Immature Granulocytes: 0.06 10*3/uL (ref 0.00–0.07)
Basophils Absolute: 0 10*3/uL (ref 0.0–0.1)
Basophils Relative: 0 %
Eosinophils Absolute: 0.3 10*3/uL (ref 0.0–0.5)
Eosinophils Relative: 2 %
HCT: 38.6 % (ref 36.0–46.0)
Hemoglobin: 12.5 g/dL (ref 12.0–15.0)
Immature Granulocytes: 1 %
Lymphocytes Relative: 32 %
Lymphs Abs: 3.2 10*3/uL (ref 0.7–4.0)
MCH: 28.5 pg (ref 26.0–34.0)
MCHC: 32.4 g/dL (ref 30.0–36.0)
MCV: 88.1 fL (ref 80.0–100.0)
Monocytes Absolute: 0.7 10*3/uL (ref 0.1–1.0)
Monocytes Relative: 7 %
Neutro Abs: 5.9 10*3/uL (ref 1.7–7.7)
Neutrophils Relative %: 58 %
Platelets: 249 10*3/uL (ref 150–400)
RBC: 4.38 MIL/uL (ref 3.87–5.11)
RDW: 13.8 % (ref 11.5–15.5)
WBC: 10.3 10*3/uL (ref 4.0–10.5)
nRBC: 0 % (ref 0.0–0.2)

## 2023-02-05 LAB — COMPREHENSIVE METABOLIC PANEL
ALT: 18 U/L (ref 0–44)
AST: 25 U/L (ref 15–41)
Albumin: 3.7 g/dL (ref 3.5–5.0)
Alkaline Phosphatase: 71 U/L (ref 38–126)
Anion gap: 12 (ref 5–15)
BUN: 18 mg/dL (ref 8–23)
CO2: 25 mmol/L (ref 22–32)
Calcium: 8.8 mg/dL — ABNORMAL LOW (ref 8.9–10.3)
Chloride: 98 mmol/L (ref 98–111)
Creatinine, Ser: 0.93 mg/dL (ref 0.44–1.00)
GFR, Estimated: >60 mL/min (ref >60)
Glucose, Bld: 122 mg/dL — ABNORMAL HIGH (ref 70–99)
Potassium: 3.6 mmol/L (ref 3.5–5.1)
Sodium: 135 mmol/L (ref 135–145)
Total Bilirubin: 0.3 mg/dL (ref 0.3–1.2)
Total Protein: 7.3 g/dL (ref 6.5–8.1)

## 2023-02-05 LAB — TSH: TSH: 2.895 u[IU]/mL (ref 0.350–4.500)

## 2023-02-05 LAB — CBG MONITORING, ED: Glucose-Capillary: 116 mg/dL — ABNORMAL HIGH (ref 70–99)

## 2023-02-05 NOTE — ED Triage Notes (Addendum)
Pt reports a progression of sxs that include LLE tingling sometime this morning; LUE and LT side facial tingling x 2 hrs; slurring speech on the way to ED; speech is normal now; reports noticed her LT foot was dragging yesterday; sxs resolved and then started again this morning (best estimate is 0900 per pt)

## 2023-02-05 NOTE — ED Notes (Signed)
EDP in triage to assess 

## 2023-02-05 NOTE — ED Provider Notes (Signed)
[place] [facility]PART[facility] AT [facility] Provider Note   CSN: [id number] Arrival date & time: 02/05/23  1920     History  Chief Complaint  Patient presents with   Numbness    Candice Reid is a 82 y.o. female.  The history is provided by the patient and medical records. No language interpreter was used.  Neurologic Problem This is a new problem. The current episode started yesterday. The problem has not changed since onset.Pertinent negatives include no chest pain, no abdominal pain, no headaches and no shortness of breath. Nothing aggravates the symptoms. Nothing relieves the symptoms. She has tried nothing for the symptoms. The treatment provided no relief.       Home Medications Prior to Admission medications   Medication Sig Start Date End Date Taking? Authorizing Provider  acetaminophen (TYLENOL) 650 MG CR tablet Take 1,300 mg by mouth every 8 (eight) hours as needed for pain.    [provider]  ALPRAZolam Prudy Feeler) 0.5 MG tablet Take 0.25-0.5 mg by mouth 3 (three) times daily as needed for anxiety.    [provider]  aspirin EC 325 MG tablet Take 1 tablet (325 mg total) by mouth 2 (two) times daily. 01/29/22   Allena Katz, PA-C  BIOTIN PO Take 1 tablet by mouth daily.    [provider]  cholecalciferol (VITAMIN D) 1000 units tablet Take 1,000 Units by mouth daily.    [provider]  DULoxetine (CYMBALTA) 60 MG capsule Take 60 mg by mouth daily.    [provider]  HYDROmorphone (DILAUDID) 2 MG tablet Take 1 tablet (2 mg total) by mouth every 4 (four) hours as needed for severe pain. 01/29/22   Allena Katz, PA-C  lansoprazole (PREVACID) 30 MG capsule Take 30 mg by mouth daily. 01/08/22   [provider]  LIDOCAINE-MENTHOL ROLL-ON EX Apply 1 application. topically daily as needed (shoulder pain).    [provider]  metoprolol (TOPROL-XL) 100 MG 24 hr tablet Take 100 mg by mouth daily.     [provider]  olmesartan (BENICAR) 20 MG tablet Take 20 mg by mouth daily.    [provider]  OMEGA-3 1000 MG CAPS Take 1,000 mg by mouth daily.    [provider]  pravastatin (PRAVACHOL) 40 MG tablet Take 40 mg by mouth every Monday, Wednesday, and Friday.    [provider]  sitaGLIPtin (JANUVIA) 100 MG tablet Take 100 mg by mouth daily.    [provider]  tiZANidine (ZANAFLEX) 2 MG tablet Take 1 tablet (2 mg total) by mouth every 6 (six) hours as needed. 01/29/22   Allena Katz, PA-C  TURMERIC PO Take 1 capsule by mouth daily.    [provider]      Allergies    Ciprofloxacin, Oxycodone-acetaminophen, Tyloxapol, Ace inhibitors, Metronidazole, Morphine and related, Oxycodone-acetaminophen, Septra [sulfamethoxazole-trimethoprim], Sulfa antibiotics, and Valium [diazepam]    Review of Systems   Review of Systems  Constitutional:  Negative for chills, fatigue and fever.  HENT:  Negative for congestion.   Eyes:  Negative for visual disturbance.  Respiratory:  Negative for cough, chest tightness and shortness of breath.   Cardiovascular:  Negative for chest pain and palpitations.  Gastrointestinal:  Negative for abdominal distention, abdominal pain, constipation, diarrhea, nausea and vomiting.  Genitourinary:  Negative for dysuria and flank pain.  Musculoskeletal:  Negative for back pain, neck pain and neck stiffness.  Skin:  Negative for rash and wound.  Neurological:  Positive for weakness and numbness. Negative for dizziness, syncope, speech difficulty, light-headedness and headaches.  Psychiatric/Behavioral:  Negative for agitation and confusion.   All other systems reviewed and are negative.   Physical Exam Updated Vital Signs BP (!) 148/73   Pulse 83   Temp 98.6 F (37 C) (Oral)   Resp 16   Ht 5' 4.5" (1.638 m)   Wt 100.2 kg   SpO2 97%   BMI 37.35 kg/m  Physical Exam Vitals and nursing note reviewed.   Constitutional:      General: She is not in acute distress.    Appearance: She is well-developed. She is not ill-appearing, toxic-appearing or diaphoretic.  HENT:     Head: Normocephalic and atraumatic.     Nose: Nose normal.     Mouth/Throat:     Mouth: Mucous membranes are moist.  Eyes:     Conjunctiva/sclera: Conjunctivae normal.  Neck:     Vascular: No carotid bruit.  Cardiovascular:     Rate and Rhythm: Normal rate and regular rhythm.     Heart sounds: No murmur heard. Pulmonary:     Effort: Pulmonary effort is normal. No respiratory distress.     Breath sounds: Normal breath sounds. No wheezing, rhonchi or rales.  Chest:     Chest wall: No tenderness.  Abdominal:     General: Abdomen is flat.     Palpations: Abdomen is soft.     Tenderness: There is no abdominal tenderness. There is no guarding or rebound.  Musculoskeletal:        General: No swelling or tenderness.     Cervical back: Neck supple. No tenderness.     Right lower leg: Edema present.     Left lower leg: Edema present.  Skin:    General: Skin is warm and dry.     Capillary Refill: Capillary refill takes less than 2 seconds.     Findings: No erythema or rash.  Neurological:     Mental Status: She is alert.     Cranial Nerves: No facial asymmetry.     Sensory: Sensory deficit present.     Motor: No weakness or abnormal muscle tone.     Coordination: Finger-Nose-Finger Test normal.     Comments: Numbness in the left lower face, left arm, and left leg.  No weakness on my exam with intact strength in all extremities.  Symmetric smile.  Clear speech.  Pupils symmetric and reactive with normal extraocular movements.  No carotid bruit.  Patient resting comfortably with the persistent numbness.  Psychiatric:        Mood and Affect: Mood normal.     ED Results / Procedures / Treatments   Labs (all labs ordered are listed, but only abnormal results are displayed) Labs Reviewed  COMPREHENSIVE METABOLIC PANEL  - Abnormal; Notable for the following components:      Result Value   Glucose, Bld 122 (*)    Calcium 8.8 (*)    All other components within normal limits  CBG MONITORING, ED - Abnormal; Notable for the following components:   Glucose-Capillary 116 (*)    All other components within normal limits  CBC WITH DIFFERENTIAL/PLATELET  TSH    EKG EKG Interpretation  Date/Time:  Saturday Feb 05 2023 20:10:50 EDT Ventricular Rate:  80 PR Interval:  160 QRS Duration: 108 QT Interval:  398 QTC Calculation: 460 R Axis:   36 Text Interpretation: Sinus rhythm when compared top rior, similar appearance with less artifact.  No STEMI Confirmed by Theda Belfast ([id number]) on 02/05/2023 8:40:35 PM  Radiology CT Head Wo Contrast  Result Date: 02/05/2023 CLINICAL DATA:  Acute stroke suspected. Left face, arm and leg numbness. EXAM: CT HEAD WITHOUT CONTRAST TECHNIQUE: Contiguous axial images were obtained from the base of the skull through the vertex without intravenous contrast. RADIATION DOSE REDUCTION: This exam was performed according to the departmental dose-optimization program which includes automated exposure control, adjustment of the mA and/or kV according to patient size and/or use of iterative reconstruction technique. COMPARISON:  MRI brain 02/01/2016 FINDINGS: Brain: No evidence of acute infarction, hemorrhage, hydrocephalus, extra-axial collection or mass lesion/mass effect. There is mild diffuse atrophy. Vascular: Atherosclerotic calcifications are present within the cavernous internal carotid arteries. Skull: Normal. Negative for fracture or focal lesion. Sinuses/Orbits: No acute finding. Other: None. IMPRESSION: 1. No acute intracranial abnormality. 2. Mild diffuse atrophy. Electronically Signed   By: Darliss Cheney M.D.   On: 02/05/2023 20:35    Procedures Procedures    Medications Ordered in ED Medications - No data to display  ED Course/ Medical Decision Making/ A&P                              Medical Decision Making Amount and/or Complexity of Data Reviewed Labs: ordered. Radiology: ordered.    SHAKEERA WOODWARD is a 82 y.o. female with a past medical history significant for hypertension, hyperlipidemia, asthma, GERD, kidney stones, sleep apnea, previous breast cancer, diabetes, and previous knee surgeries who presents with neurologic implant.  According to patient, she has had what she thought was carpal tunnel for months but typically it is bilaterally in the wrists.  She said that after having the surgery years ago she also has had some mild left lower leg numbness and weakness that but that usually does not bother her.  She reports that yesterday she noticed numbness in her left lower leg below the knee that lasted for several hours before improving.  She reports that this morning she woke up and did not feel the numbness but then around 9 AM started having the numbness and weakness in the left lower leg with some foot drop and dragging it.  She said that she went to an urgent care today and was told she likely had neuropathy symptoms and needs to follow-up.  She otherwise did not have other symptoms at that time.  She reports after leaving there sometime around 130 or 2:00 she started noticing numbness in her left face and left arm.  She said she dropped her phone out of her left hand and thought she might of had some intermittent weakness in the left arm.  Denies any vision changes or speech difficulties.  Denies headache or neck pain or neck stiffness.  Denies recent neck manipulation, chiropractor management, or massages.  Denies trauma.  She is not any chest pain palpitations or shortness of breath.  Denies nausea, vomiting, constipation, diarrhea, or urinary changes.  Denies any infectious symptoms.  Denies any dizziness.  I was asked to come see patient while and still in triage.  On my exam, she does have numbness to the left lower face and sparing the upper face.  She has  numbness in the left arm and left leg which is new and not on the right side.  She had no weakness on my exam with symmetric smile.  Intact grip strength bilaterally, and normal strength in the legs.  She  did not have any focal tenderness or rashes.  Good pulses.  I quickly spoke to Dr. Selina Cooley with neurology who reviewed the case.  As she has had some numbness symptoms yesterday and had numbness something starting at 9 AM, she does not feel she should be activated as a code stroke and I agree.  Will have her get a noncontrast CT head here and then transfer ED to ED to get the MRI brain without contrast for suspected stroke tonight.  She did not feel patient needed CTA at this time.  Spoke to Dr. Dalene Seltzer in the [facility] emergency department who accept the patient for ED to ED transfer.  Will get screening labs and will transfer for MRI to determine disposition.         Final Clinical Impression(s) / ED Diagnoses Final diagnoses:  Left sided numbness   Clinical Impression: 1. Left sided numbness     Disposition: Patient will be ED to ED transfer for MRI to rule out stroke as the cause of the left-sided numbness in face, arm, and leg with transient weakness in the left arm and left leg.  This note was prepared with assistance of Conservation officer, historic buildings. Occasional wrong-word or sound-a-like substitutions may have occurred due to the inherent limitations of voice recognition software.        Karigan Cloninger, Canary Brim, MD 02/05/23 2253

## 2023-02-05 NOTE — ED Notes (Signed)
[facility] called for transport. 

## 2023-02-06 ENCOUNTER — Emergency Department (HOSPITAL_COMMUNITY): Payer: Medicare Other

## 2023-02-06 ENCOUNTER — Observation Stay (HOSPITAL_BASED_OUTPATIENT_CLINIC_OR_DEPARTMENT_OTHER): Payer: Medicare Other

## 2023-02-06 ENCOUNTER — Observation Stay (HOSPITAL_COMMUNITY): Payer: Medicare Other

## 2023-02-06 DIAGNOSIS — K219 Gastro-esophageal reflux disease without esophagitis: Secondary | ICD-10-CM

## 2023-02-06 DIAGNOSIS — R29701 NIHSS score 1: Secondary | ICD-10-CM | POA: Diagnosis not present

## 2023-02-06 DIAGNOSIS — I63233 Cerebral infarction due to unspecified occlusion or stenosis of bilateral carotid arteries: Secondary | ICD-10-CM | POA: Diagnosis not present

## 2023-02-06 DIAGNOSIS — I639 Cerebral infarction, unspecified: Secondary | ICD-10-CM | POA: Diagnosis present

## 2023-02-06 DIAGNOSIS — Z79899 Other long term (current) drug therapy: Secondary | ICD-10-CM | POA: Diagnosis not present

## 2023-02-06 DIAGNOSIS — I10 Essential (primary) hypertension: Secondary | ICD-10-CM

## 2023-02-06 DIAGNOSIS — E1165 Type 2 diabetes mellitus with hyperglycemia: Secondary | ICD-10-CM

## 2023-02-06 DIAGNOSIS — G4733 Obstructive sleep apnea (adult) (pediatric): Secondary | ICD-10-CM | POA: Insufficient documentation

## 2023-02-06 DIAGNOSIS — E119 Type 2 diabetes mellitus without complications: Secondary | ICD-10-CM

## 2023-02-06 DIAGNOSIS — R200 Anesthesia of skin: Secondary | ICD-10-CM | POA: Diagnosis not present

## 2023-02-06 DIAGNOSIS — I6389 Other cerebral infarction: Secondary | ICD-10-CM | POA: Diagnosis not present

## 2023-02-06 DIAGNOSIS — E785 Hyperlipidemia, unspecified: Secondary | ICD-10-CM

## 2023-02-06 DIAGNOSIS — Z96653 Presence of artificial knee joint, bilateral: Secondary | ICD-10-CM | POA: Diagnosis not present

## 2023-02-06 DIAGNOSIS — J45909 Unspecified asthma, uncomplicated: Secondary | ICD-10-CM | POA: Diagnosis not present

## 2023-02-06 DIAGNOSIS — Z7982 Long term (current) use of aspirin: Secondary | ICD-10-CM | POA: Diagnosis not present

## 2023-02-06 DIAGNOSIS — Z853 Personal history of malignant neoplasm of breast: Secondary | ICD-10-CM | POA: Diagnosis not present

## 2023-02-06 DIAGNOSIS — I63212 Cerebral infarction due to unspecified occlusion or stenosis of left vertebral arteries: Secondary | ICD-10-CM | POA: Diagnosis not present

## 2023-02-06 LAB — ECHOCARDIOGRAM COMPLETE
AR max vel: 1.72 cm2
AV Area VTI: 2.08 cm2
AV Area mean vel: 1.71 cm2
AV Mean grad: 4 mmHg
AV Peak grad: 7.8 mmHg
Ao pk vel: 1.4 m/s
Area-P 1/2: 3.23 cm2
Calc EF: 61.7 %
Height: 64.5 in
S' Lateral: 2.4 cm
Single Plane A2C EF: 68.1 %
Single Plane A4C EF: 57.4 %
Weight: 3536 oz

## 2023-02-06 LAB — GLUCOSE, CAPILLARY: Glucose-Capillary: 106 mg/dL — ABNORMAL HIGH (ref 70–99)

## 2023-02-06 LAB — LIPID PANEL
Cholesterol: 198 mg/dL (ref 0–200)
HDL: 55 mg/dL (ref >40)
LDL Cholesterol: 96 mg/dL (ref 0–99)
Total CHOL/HDL Ratio: 3.6 RATIO
Triglycerides: 233 mg/dL — ABNORMAL HIGH (ref <150)
VLDL: 47 mg/dL — ABNORMAL HIGH (ref 0–40)

## 2023-02-06 LAB — HEMOGLOBIN A1C
Hgb A1c MFr Bld: 6.9 % — ABNORMAL HIGH (ref 4.8–5.6)
Mean Plasma Glucose: 151.33 mg/dL

## 2023-02-06 LAB — CBG MONITORING, ED: Glucose-Capillary: 115 mg/dL — ABNORMAL HIGH (ref 70–99)

## 2023-02-06 MED ORDER — CLOPIDOGREL BISULFATE 75 MG PO TABS: 75.0000 mg | ORAL_TABLET | Freq: Every day | ORAL | 0 refills | Status: AC

## 2023-02-06 MED ORDER — PRAVASTATIN SODIUM 40 MG PO TABS: 40.0000 mg | ORAL_TABLET | ORAL | Status: DC

## 2023-02-06 MED ORDER — ENOXAPARIN SODIUM 40 MG/0.4ML IJ SOSY
40.0000 mg | PREFILLED_SYRINGE | INTRAMUSCULAR | Status: DC
  Administered 2023-02-06: 40 mg via SUBCUTANEOUS
  Filled 2023-02-06: qty 0.4

## 2023-02-06 MED ORDER — ROSUVASTATIN CALCIUM 20 MG PO TABS: 20.0000 mg | ORAL_TABLET | Freq: Every day | ORAL | 1 refills | Status: AC

## 2023-02-06 MED ORDER — ACETAMINOPHEN 325 MG PO TABS: 650.0000 mg | ORAL_TABLET | Freq: Four times a day (QID) | ORAL | Status: DC | PRN

## 2023-02-06 MED ORDER — STROKE: EARLY STAGES OF RECOVERY BOOK: Freq: Once | Status: DC

## 2023-02-06 MED ORDER — DULOXETINE HCL 60 MG PO CPEP
60.0000 mg | ORAL_CAPSULE | Freq: Every day | ORAL | Status: DC
  Administered 2023-02-06: 60 mg via ORAL
  Filled 2023-02-06: qty 2

## 2023-02-06 MED ORDER — ALBUTEROL SULFATE (2.5 MG/3ML) 0.083% IN NEBU: 2.5000 mg | INHALATION_SOLUTION | Freq: Four times a day (QID) | RESPIRATORY_TRACT | Status: DC | PRN

## 2023-02-06 MED ORDER — INSULIN ASPART 100 UNIT/ML IJ SOLN: 0.0000 [IU] | Freq: Three times a day (TID) | INTRAMUSCULAR | Status: DC

## 2023-02-06 MED ORDER — ACETAMINOPHEN 160 MG/5ML PO SOLN: 650.0000 mg | Freq: Four times a day (QID) | ORAL | Status: DC | PRN

## 2023-02-06 MED ORDER — PANTOPRAZOLE SODIUM 40 MG PO TBEC
40.0000 mg | DELAYED_RELEASE_TABLET | Freq: Every day | ORAL | Status: DC
  Administered 2023-02-06: 40 mg via ORAL
  Filled 2023-02-06: qty 1

## 2023-02-06 MED ORDER — ACETAMINOPHEN 650 MG RE SUPP: 650.0000 mg | Freq: Four times a day (QID) | RECTAL | Status: DC | PRN

## 2023-02-06 MED ORDER — CLOPIDOGREL BISULFATE 75 MG PO TABS
75.0000 mg | ORAL_TABLET | Freq: Every day | ORAL | Status: DC
  Administered 2023-02-06: 75 mg via ORAL
  Filled 2023-02-06: qty 1

## 2023-02-06 MED ORDER — ASPIRIN 81 MG PO TBEC
81.0000 mg | DELAYED_RELEASE_TABLET | Freq: Every day | ORAL | Status: DC
  Administered 2023-02-06: 81 mg via ORAL
  Filled 2023-02-06: qty 1

## 2023-02-06 MED ORDER — INSULIN ASPART 100 UNIT/ML IJ SOLN: 0.0000 [IU] | Freq: Every day | INTRAMUSCULAR | Status: DC

## 2023-02-06 MED ORDER — LORAZEPAM 2 MG/ML IJ SOLN
0.5000 mg | Freq: Once | INTRAMUSCULAR | Status: AC
  Administered 2023-02-06: 0.5 mg via INTRAVENOUS
  Filled 2023-02-06: qty 1

## 2023-02-06 MED ORDER — OLMESARTAN MEDOXOMIL 20 MG PO TABS: 20.0000 mg | ORAL_TABLET | Freq: Every day | ORAL | Status: AC

## 2023-02-06 MED ORDER — ALPRAZOLAM 0.25 MG PO TABS: 0.2500 mg | ORAL_TABLET | Freq: Two times a day (BID) | ORAL | Status: DC | PRN

## 2023-02-06 MED ORDER — ROSUVASTATIN CALCIUM 20 MG PO TABS
20.0000 mg | ORAL_TABLET | Freq: Every day | ORAL | Status: DC
  Administered 2023-02-06: 20 mg via ORAL
  Filled 2023-02-06: qty 1

## 2023-02-06 MED ORDER — IOHEXOL 350 MG/ML SOLN
75.0000 mL | Freq: Once | INTRAVENOUS | Status: AC | PRN
  Administered 2023-02-06: 75 mL via INTRAVENOUS

## 2023-02-06 MED ORDER — EZETIMIBE 10 MG PO TABS
10.0000 mg | ORAL_TABLET | Freq: Every day | ORAL | Status: DC
  Administered 2023-02-06: 10 mg via ORAL
  Filled 2023-02-06: qty 1

## 2023-02-06 NOTE — Evaluation (Signed)
Occupational Therapy Evaluation Patient Details Name: Candice Reid MRN: [id number] DOB: February 25, 1941 Today's Date: 02/06/2023   History of Present Illness 82 y.o. female presented 02/05/23 with strokelike symptoms including left-sided weakness and paresthesias. 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 Upon eval, pt presenting near baseline. Pt with min L sided weakness. Requiring close guard for OOB likely due to recent ativan given for MRI. Son present and agreeable to someone staying with pt at least 24 hours when discharged (to monitor and assist with her dog). Pt passing short blessed test and denies visual changes as well. All education completed and questions answered. OT to sign off; thank you for this order.      Recommendations for follow up therapy are one component of a multi-disciplinary discharge planning process, led by the attending physician.  Recommendations may be updated based on patient status, additional functional criteria and insurance authorization.   Assistance Recommended at Discharge Intermittent Supervision/Assistance  Patient can return home with the following Help with stairs or ramp for entrance;Assistance with cooking/housework;Assist for transportation (as needed)    Functional Status Assessment  Patient has had a recent decline in their functional status and demonstrates the ability to make significant improvements in function in a reasonable and predictable amount of time.  Equipment Recommendations  None recommended by OT    Recommendations for Other Services       Precautions / Restrictions Precautions Precautions: Fall Precaution Comments: reports 2 falls in  the past year--each time she did not have her walker      Mobility Bed Mobility Overal bed mobility: Needs Assistance Bed Mobility: Supine to Sit, Sit to Supine     Supine to sit: Min assist, HOB elevated Sit to supine: Supervision   General bed mobility comments: pt on ED stretcher; needed assist to raise torso    Transfers Overall transfer level: Needs assistance Equipment used: None Transfers: Sit to/from Stand Sit to Stand: Min guard           General transfer comment: pt recently had ativan due to claustrophobia and went to MRI; slightly woozy      Balance Overall balance assessment: Mild deficits observed, not formally tested (pt feels LOB due to recent ativan)                                         ADL either performed or assessed with clinical judgement   ADL Overall ADL's : Needs assistance/impaired Eating/Feeding: Modified independent;Sitting   Grooming: Supervision/safety;Standing   Upper Body Bathing: Set up;Sitting   Lower Body Bathing: Min guard;Sit to/from stand   Upper Body Dressing : Set up;Sitting   Lower Body Dressing: Min guard;Sit to/from stand   Toilet Transfer: Min guard;Ambulation;Rollator (4 wheels)   Toileting- Clothing Manipulation and Hygiene: Modified independent;Sitting/lateral lean     Tub/Shower Transfer Details (indicate cue type and reason): able to describe how she uses tub bench at home Functional mobility during ADLs: Min guard;Rollator (4 wheels)       Vision Baseline Vision/History: 0 No visual deficits Ability to See in Adequate Light: 0 Adequate Patient Visual Report: No change  from baseline Vision Assessment?: No apparent visual deficits Additional Comments: denies visual changes.     Perception     Praxis      Pertinent Vitals/Pain       Hand Dominance Right   Extremity/Trunk Assessment Upper Extremity Assessment Upper Extremity Assessment: LUE deficits/detail LUE Deficits /  Details: slightly decr strength as compared to R, but using functionally.   Lower Extremity Assessment Lower Extremity Assessment: Defer to PT evaluation LLE Deficits / Details: slight lt trendelenburg with gait LLE Sensation: WNL   Cervical / Trunk Assessment Cervical / Trunk Assessment: Other exceptions Cervical / Trunk Exceptions: obese   Communication Communication Communication: No difficulties   Cognition Arousal/Alertness: Awake/alert Behavior During Therapy: WFL for tasks assessed/performed Overall Cognitive Status: Within Functional Limits for tasks assessed                                 General Comments: passing short blessed test with score of 2     General Comments  Son present; reporting someone can stay with her overnight when she first returns home    Exercises     Shoulder Instructions      Home Living Family/patient expects to be discharged to:: Private residence Living Arrangements: Alone Available Help at Discharge: Family;Available 24 hours/day Type of Home: House Home Access: Stairs to enter [employer] of Steps: 3 Entrance Stairs-Rails: Right;Left (cannot reach both) Home Layout: One level     Bathroom Shower/Tub: Chief Strategy Officer: Handicapped height     Home Equipment: Rollator (4 wheels);Tub bench;Grab bars - tub/shower;Grab bars - toilet;Cane - single point          Prior Functioning/Environment Prior Level of Function : Independent/Modified Independent;Driving             Mobility Comments: More recent use of rollator. Was using in home for safety to reduce risk of tripping over her dog. Was not using in community until recently (when LLE numbness/weakness began). ADLs Comments: independent in ADL, IADL, driving        OT Problem List: Decreased strength;Decreased activity tolerance;Impaired balance (sitting and/or standing)      OT Treatment/Interventions:      OT Goals(Current  goals can be found in the care plan section) Acute Rehab OT Goals Patient Stated Goal: go home OT Goal Formulation: With patient  OT Frequency:      Co-evaluation              AM-PAC OT "6 Clicks" Daily Activity     Outcome Measure Help from another person eating meals?: None Help from another person taking care of personal grooming?: A Little Help from another person toileting, which includes using toliet, bedpan, or urinal?: A Little Help from another person bathing (including washing, rinsing, drying)?: A Little Help from another person to put on and taking off regular upper body clothing?: A Little Help from another person to put on and taking off regular lower body clothing?: A Little 6 Click Score: 19   End of Session Equipment Utilized During Treatment: Rollator (4 wheels) Nurse Communication: Mobility status  Activity Tolerance: Patient tolerated treatment well Patient left: in bed;with call bell/phone within reach;with family/visitor present  OT Visit Diagnosis: Unsteadiness on feet (R26.81);Muscle weakness (generalized) (M62.81)                Time: 1610-9604 OT Time Calculation (min): 27 min Charges:  OT General Charges $  OT Visit: 1 Visit OT Evaluation $OT Eval Low Complexity: 1 Low  Tyler Deis, OTR/L [facility] Acute Rehabilitation Office: [phone]   Myrla Halsted 02/06/2023, 9:36 AM

## 2023-02-06 NOTE — Consult Note (Signed)
NEUROLOGY CONSULTATION NOTE   Date of service: Feb 06, 2023 Patient Name: Candice Reid MRN:  [id number] DOB:  05-25-1941 Reason for consult: "L sided weakness" Requesting Provider: John Giovanni, MD _ _ _   _ __   _ __ _ _  __ __   _ __   __ _  History of Present Illness  Candice Reid is a 82 y.o. female with PMH significant for GERD, hypertension, hyperlipidemia, diabetes, anxiety, depression, OSA not on CPAP who presents with left-sided weakness and numbness.  She was found to have a right thalamic stroke.  She reports that she woke up at 7 AM in the morning and was at her baseline.  She noticed around 9 AM in the morning, her left leg was little clumsy and felt little numb.  She did not think too much about it but then it spread all over to her arm on the left and then into the left lower face.  She discussed this with her family and then they later came to the hospital.   LKW: 0900 on 02/05/2023. mRS: 1 tNKASE: Not offered is too mild to treat and outside the window. Thrombectomy: Not offered as low suspicion for LVO and too mild to treat. NIHSS components Score: Comment  1a Level of Conscious 0[x]  1[]  2[]  3[]      1b LOC Questions 0[x]  1[]  2[]       1c LOC Commands 0[x]  1[]  2[]       2 Best Gaze 0[x]  1[]  2[]       3 Visual 0[x]  1[]  2[]  3[]      4 Facial Palsy 0[x]  1[]  2[]  3[]      5a Motor Arm - left 0[x]  1[]  2[]  3[]  4[]  UN[]    5b Motor Arm - Right 0[x]  1[]  2[]  3[]  4[]  UN[]    6a Motor Leg - Left 0[x]  1[]  2[]  3[]  4[]  UN[]    6b Motor Leg - Right 0[x]  1[]  2[]  3[]  4[]  UN[]    7 Limb Ataxia 0[x]  1[]  2[]  3[]  UN[]     8 Sensory 0[]  1[x]  2[]  UN[]      9 Best Language 0[x]  1[]  2[]  3[]      10 Dysarthria 0[x]  1[]  2[]  UN[]      11 Extinct. and Inattention 0[x]  1[]  2[]       TOTAL: 1        ROS   Constitutional Denies weight loss, fever and chills.   HEENT Denies changes in vision and hearing.   Respiratory Denies SOB and cough.   CV Denies palpitations and CP   GI Denies  abdominal pain, nausea, vomiting and diarrhea.   GU Denies dysuria and urinary frequency.   MSK Denies myalgia and joint pain.   Skin Denies rash and pruritus.   Neurological Denies headache and syncope.   Psychiatric Denies recent changes in mood. Denies anxiety and depression.    Past History   Past Medical History:  Diagnosis Date   Anemia    Anxiety  Arthritis    Asthma    Breast cancer (HCC) 2015   Complication of anesthesia    shaky-hard to wake up, blood pressure dropped   Dental bridge present    Depression    Diabetes (HCC)    Fatty liver    GERD (gastroesophageal reflux disease)    Hemorrhoids    History of kidney stones    Hot flashes    HTN (hypertension)    Hyperlipemia    Hypertension    Kidney stones    Neuropathy    Obesity    Obstructive sleep apnea    Osteoarthritis bilteral knees   Personal history of radiation therapy 2015   Phobia    Plantar fasciitis    Radiation 05/14/2014   44 Gy Left breast   Wears glasses    reading   Past Surgical History:  Procedure Laterality Date   BREAST LUMPECTOMY  1985   left-neg   BREAST LUMPECTOMY Left 03/11/2014   left   COLONOSCOPY  03/11/2011   diverticulosis, internal hemorrhoids   CYSTOSCOPY W/ URETERAL STENT PLACEMENT     removed   DILATION AND CURETTAGE OF UTERUS     IR ABLATE LIVER CRYOABLATION  04/17/2020   IR RADIOLOGIST EVAL & MGMT  04/03/2020   IR RADIOLOGIST EVAL & MGMT  05/22/2020   KNEE SURGERY Left    x 2-left   OOPHORECTOMY Bilateral    TOTAL KNEE ARTHROPLASTY Right 10/17/2020   Procedure: TOTAL KNEE ARTHROPLASTY;  Surgeon: Jodi Geralds, MD;  Location: [facility];  Service: Orthopedics;  Laterality: Right;   TOTAL KNEE ARTHROPLASTY Left 01/29/2022    Procedure: TOTAL KNEE ARTHROPLASTY;  Surgeon: Jodi Geralds, MD;  Location: [facility];  Service: Orthopedics;  Laterality: Left;   VAGINAL HYSTERECTOMY  1988   endometriosis, fibroids   Family History  Problem Relation Age of Onset   Diabetes Mother    Colon polyps Sister    Lung cancer Sister    Ovarian cancer Sister    Brain cancer Sister    Prostate cancer Father    Brain cancer Brother    Colon polyps Maternal Aunt    Renal Disease Sister    Arthritis Other        Neice   Colon cancer Neg Hx    Social History   Socioeconomic History   Marital status: Widowed    Spouse name: Not on file   Number of children: 2   Years of education: Not on file   Highest education level: Not on file  Occupational History   Occupation: Retired   Tobacco Use   Smoking status: Never   Smokeless tobacco: Never  Vaping Use   Vaping Use: Never used  Substance and Sexual Activity   Alcohol use: No   Drug use: No   Sexual activity: Not on file    Comment: Hysterectomy  Other Topics Concern   Not on file  Social History Narrative   1-2 caffeine drinks daily    Social Determinants of Health   Financial Resource Strain: Not on file  Food Insecurity: Not on file  Transportation Needs: Not on file  Physical Activity: Not on file  Stress: Not on file  Social Connections: Not on file   Allergies  Allergen Reactions   Ciprofloxacin     Unknown reaction   Oxycodone-Acetaminophen Nausea Only   Tyloxapol Nausea And Vomiting   Ace Inhibitors Cough   Metronidazole Nausea Only   Morphine And Related Itching   Septra [Sulfamethoxazole-Trimethoprim]  Nausea Only   Sulfa Antibiotics Nausea Only   Valium [Diazepam] Other (See Comments)    "Brain fog"    Medications  (Not in a hospital admission)    Vitals   Vitals:   02/06/23 0215 02/06/23 0230 02/06/23 0430 02/06/23 0532  BP: (!) 155/77 (!) 154/80 (!) 148/83   Pulse: 76 73 74   Resp: 16 (!) 21 (!) 23   Temp:    97.7 F (36.5 C)   TempSrc:    Oral  SpO2: 100% 100% 100%   Weight:      Height:         Body mass index is 37.35 kg/m.  Physical Exam   General: Laying comfortably in bed; in no acute distress.  HENT: Normal oropharynx and mucosa. Normal external appearance of ears and nose.  Neck: Supple, no pain or tenderness  CV: No JVD. No peripheral edema.  Pulmonary: Symmetric Chest rise. Normal respiratory effort.  Abdomen: Soft to touch, non-tender.  Ext: No cyanosis, edema, or deformity  Skin: No rash. Normal palpation of skin.   Musculoskeletal: Normal digits and nails by inspection. No clubbing.   Neurologic Examination  Mental status/Cognition: Alert, oriented to self, place, month and year, good attention.  Speech/language: Fluent, comprehension intact, object naming intact, repetition intact.  Cranial nerves:   CN II Pupils equal and reactive to light, no VF deficits    CN III,IV,VI EOM intact, no gaze preference or deviation, no nystagmus    CN V normal sensation in V1, V2, and V3 segments bilaterally    CN VII no asymmetry, no nasolabial fold flattening    CN VIII normal hearing to speech    CN IX & X normal palatal elevation, no uvular deviation    CN XI 5/5 head turn and 5/5 shoulder shrug bilaterally    CN XII midline tongue protrusion    Motor:  Muscle bulk: normal, tone normal, pronator drift none tremor none Mvmt Root Nerve  Muscle Right Left Comments  SA C5/6 Ax Deltoid 5 5   EF C5/6 Mc Biceps 5 5   EE C6/7/8 Rad Triceps 5 5   WF C6/7 Med FCR     WE C7/8 PIN ECU     F Ab C8/T1 U ADM/FDI 5 5   HF L1/2/3 Fem Illopsoas 5 5   KE L2/3/4 Fem Quad 5 5   DF L4/5 D Peron Tib Ant 5 5   PF S1/2 Tibial Grc/Sol 5 5    Sensation:  Light touch Mildly to touch in right leg.   Pin prick    Temperature    Vibration   Proprioception    Coordination/Complex Motor:  - Finger to Nose intact BL - Heel to shin intact BL - Rapid alternating movement are normal - Gait: Deferred for patient's  safety. Labs   CBC:  Recent Labs  Lab 02/05/23 2010  WBC 10.3  NEUTROABS 5.9  HGB 12.5  HCT 38.6  MCV 88.1  PLT 249    Basic Metabolic Panel:  Lab Results  Component Value Date   NA 135 02/05/2023   K 3.6 02/05/2023   CO2 25 02/05/2023   GLUCOSE 122 (H) 02/05/2023   BUN 18 02/05/2023   CREATININE 0.93 02/05/2023   CALCIUM 8.8 (L) 02/05/2023   GFRNONAA >60 02/05/2023   GFRAA >60 04/10/2019   Lipid Panel: No results found for: "LDLCALC" HgbA1c:  Lab Results  Component Value Date   HGBA1C 6.2 (H) 01/26/2022   Urine Drug  Screen: No results found for: "LABOPIA", "COCAINSCRNUR", "LABBENZ", "AMPHETMU", "THCU", "LABBARB"  Alcohol Level     Component Value Date/Time   ETH <5 02/01/2016 1213    CT Head without contrast(Personally reviewed): CTH was negative for a large hypodensity concerning for a large territory infarct or hyperdensity concerning for an ICH  CT angio Head and Neck with contrast: pending  MRI Brain(Personally reviewed): 1. 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 No hemorrhage or mass effect. 2. Findings of chronic small vessel ischemia.  Impression   Candice Reid is a 82 y.o. female with PMH significant for GERD, hypertension, hyperlipidemia, diabetes, anxiety, depression, OSA not on CPAP who presents with left-sided weakness and numbness. She was found to have a small acute infarct in the right lateral thalamus.  Likely etiology is small vessel disease.  Primary Diagnosis:  Other cerebral infarction due to occlusion of stenosis of small artery.  Secondary Diagnosis: Essential (primary) hypertension, Type 2 diabetes mellitus w/o complications, and Obesity  Recommendations  - Frequent Neuro checks per stroke unit protocol - Recommend Vascular imaging with CT angio head and neck with without contrast. - Recommend obtaining TTE - Recommend obtaining Lipid panel  with LDL - Please start statin if LDL > 70 - Recommend HbA1c to evaluate for diabetes and how well it is controlled. - Antithrombotic -aspirin 81 mg daily, Plavix 75 mg daily for 21 days, followed by aspirin 81 mg daily alone. - Recommend DVT ppx - SBP goal - permissive hypertension first 24 h < 220/110. Held home meds.  - Recommend Telemetry monitoring for arrythmia - Recommend bedside swallow screen prior to PO intake. - Stroke education booklet - Recommend PT/OT/SLP consult  ______________________________________________________________________   Thank you for the opportunity to take part in the care of this patient. If you have any further questions, please contact the neurology consultation attending.  Signed,  Erick Blinks [facility] Pager Number [phone] _ _ _   _ __   _ __ _ _  __ __   _ __   __ _

## 2023-02-06 NOTE — ED Notes (Signed)
Patient ambulated to the bathroom independently with walker.

## 2023-02-06 NOTE — H&P (Signed)
History and Physical    YAZLENE BUCHMANN WUJ:[id number] DOB: 13-Jul-1941 DOA: 02/05/2023  PCP: Aliene Beams, MD  Patient coming from: Home  Chief Complaint: Left-sided weakness  HPI: Candice Reid is a 82 y.o. female with medical history significant of hypertension, hyperlipidemia, type 2 diabetes, GERD, history of breast cancer, anemia, anxiety, depression, arthritis, asthma, OSA not on CPAP presented to [facility] ED with strokelike symptoms including left-sided weakness and paresthesias.  Slightly hypertensive, remainder of vital signs stable.  Labs showing WBC 10.3, hemoglobin 12.5, platelet count 249k, sodium 135, potassium 3.6, chloride 98, bicarb 25, BUN 18, creatinine 0.9, glucose 122, TSH normal.  CT head negative for acute intracranial abnormality.  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  Neurology consulted and [facility] called to admit.  Patient states yesterday morning she woke up with numbness of her left leg from the knee down.  She did have some numbness in this leg the night before but it was worse when she woke up in the morning.  She also had tingling in this leg.  Afterwards she started having numbness and tingling in her left forearm and hand and numbness on the left side of her lower face.  No facial droop or difficulty speaking.  Her left foot was dragging when she tried to walk and she had weakness of her left arm.  She reports history of carpal tunnel syndrome.  Patient states her symptoms have now improved.  She denies history of prior stroke.  She has no other complaints.  Denies fevers, cough, shortness breath, chest pain, nausea, vomiting, or abdominal pain.  Review of Systems:  Review of Systems  All other systems reviewed and are negative.   Past Medical History:  Diagnosis Date   Anemia     Anxiety                                                                                                                                                                                                                                                                   Arthritis  Asthma    Breast cancer (HCC) 2015   Complication of anesthesia    shaky-hard to wake up, blood pressure dropped   Dental bridge present    Depression    Diabetes (HCC)    Fatty liver    GERD (gastroesophageal reflux disease)    Hemorrhoids    History of kidney stones    Hot flashes    HTN (hypertension)    Hyperlipemia    Hypertension    Kidney stones    Neuropathy    Obesity    Obstructive sleep apnea    Osteoarthritis bilteral knees   Personal history of radiation therapy 2015   Phobia    Plantar fasciitis    Radiation 05/14/2014   44 Gy Left breast   Wears glasses    reading    Past Surgical History:  Procedure Laterality Date   BREAST LUMPECTOMY  1985   left-neg   BREAST LUMPECTOMY Left 03/11/2014   left   COLONOSCOPY  03/11/2011   diverticulosis, internal hemorrhoids   CYSTOSCOPY W/ URETERAL STENT PLACEMENT     removed   DILATION AND CURETTAGE OF UTERUS     IR ABLATE LIVER CRYOABLATION  04/17/2020   IR RADIOLOGIST EVAL & MGMT  04/03/2020   IR RADIOLOGIST EVAL & MGMT  05/22/2020   KNEE SURGERY Left    x 2-left   OOPHORECTOMY Bilateral    TOTAL KNEE ARTHROPLASTY Right 10/17/2020   Procedure: TOTAL KNEE ARTHROPLASTY;  Surgeon: Jodi Geralds, MD;  Location: [facility]S;  Service: Orthopedics;  Laterality: Right;   TOTAL KNEE ARTHROPLASTY Left 01/29/2022   Procedure: TOTAL KNEE ARTHROPLASTY;  Surgeon: Jodi Geralds, MD;  Location: [facility];  Service: Orthopedics;  Laterality: Left;   VAGINAL HYSTERECTOMY  1988   endometriosis, fibroids     reports that she has never smoked. She has never used smokeless tobacco. She reports that she does not drink alcohol and does not use drugs.  Allergies   Allergen Reactions   Ciprofloxacin     Unknown reaction   Oxycodone-Acetaminophen Nausea Only   Tyloxapol Nausea And Vomiting   Ace Inhibitors Cough   Metronidazole Nausea Only   Morphine And Related Itching   Septra [Sulfamethoxazole-Trimethoprim] Nausea Only   Sulfa Antibiotics Nausea Only   Valium [Diazepam] Other (See Comments)    "Brain fog"    Family History  Problem Relation Age of Onset   Diabetes Mother    Colon polyps Sister    Lung cancer Sister    Ovarian cancer Sister    Brain cancer Sister    Prostate cancer Father    Brain cancer Brother    Colon polyps Maternal Aunt    Renal Disease Sister    Arthritis Other        Neice   Colon cancer Neg Hx     Prior to Admission medications   Medication Sig Start Date End Date Taking? Authorizing Provider  acetaminophen (TYLENOL) 650 MG CR tablet Take 1,300 mg by mouth daily.   Yes [provider]  ALPRAZolam Prudy Feeler) 0.5 MG tablet Take 0.25 mg by mouth 2 (two) times daily as needed for anxiety.   Yes [provider]  cholecalciferol (VITAMIN D) 1000 units tablet Take 1,000 Units by mouth daily.   Yes [provider]  DULoxetine (CYMBALTA) 60 MG capsule Take 60 mg by mouth daily.   Yes [provider]  ezetimibe (ZETIA) 10 MG tablet Take 10 mg by mouth daily. 01/27/23  Yes [provider]  lansoprazole (PREVACID) 30 MG capsule Take 30 mg by mouth daily. 01/08/22  Yes [provider]  LIDOCAINE-MENTHOL ROLL-ON EX Apply 1 application. topically daily as needed (shoulder pain).   Yes [provider]  metoprolol (TOPROL-XL) 100 MG 24 hr tablet Take 100 mg by mouth daily.   Yes [provider]  olmesartan (BENICAR) 20 MG tablet Take 20 mg by mouth daily.   Yes [provider]  OMEGA-3 1000 MG CAPS Take 1,000 mg by mouth daily.   Yes [provider]  pravastatin (PRAVACHOL) 40 MG tablet Take 40 mg by mouth every Monday, Wednesday, and Friday.    Yes [provider]  sitaGLIPtin (JANUVIA) 100 MG tablet Take 100 mg by mouth daily.   Yes [provider]  tiZANidine (ZANAFLEX) 2 MG tablet Take 1 tablet (2 mg total) by mouth every 6 (six) hours as needed. Patient taking differently: Take 4 mg by mouth every 8 (eight) hours as needed for muscle spasms. 01/29/22  Yes Allena Katz, PA-C    Physical Exam: Vitals:   02/06/23 0130 02/06/23 0145 02/06/23 0215 02/06/23 0230  BP: (!) 146/89 (!) 157/86 (!) 155/77 (!) 154/80  Pulse: 73 74 76 73  Resp: (!) 21 (!) 24 16 (!) 21  Temp:      TempSrc:      SpO2: 100% 100% 100% 100%  Weight:      Height:        Physical Exam Vitals reviewed.  Constitutional:      General: She is not in acute distress. HENT:     Head: Normocephalic and atraumatic.  Eyes:     Extraocular Movements: Extraocular movements intact.  Cardiovascular:     Rate and Rhythm: Normal rate and regular rhythm.     Pulses: Normal pulses.  Pulmonary:     Effort: Pulmonary effort is normal. No respiratory distress.     Breath sounds: Normal breath sounds. No wheezing or rales.  Abdominal:     General: Bowel sounds are normal. There is no distension.     Palpations: Abdomen is soft.     Tenderness: There is no abdominal tenderness.  Musculoskeletal:     Cervical back: Normal range of motion.     Right lower leg: No edema.     Left lower leg: No edema.  Skin:    General: Skin is warm and dry.  Neurological:     General: No focal deficit present.     Mental Status: She is alert and oriented to person, place, and time.     Cranial Nerves: No cranial nerve deficit.     Sensory: No sensory deficit.     Motor: No weakness.     Labs on Admission: I have personally reviewed following labs and imaging studies  CBC: Recent Labs  Lab 02/05/23 2010  WBC 10.3  NEUTROABS 5.9  HGB 12.5  HCT 38.6  MCV 88.1  PLT 249   Basic Metabolic Panel: Recent Labs  Lab 02/05/23 2010  NA 135  K 3.6  CL  98  CO2 25  GLUCOSE 122*  BUN 18  CREATININE 0.93  CALCIUM 8.8*   GFR: Estimated Creatinine Clearance: 55.1 mL/min (by C-G formula based on SCr of 0.93 mg/dL). Liver Function Tests: Recent Labs  Lab 02/05/23 2010  AST 25  ALT 18  ALKPHOS 71  BILITOT 0.3  PROT 7.3  ALBUMIN 3.7   No results for input(s): "LIPASE", "AMYLASE" in the last 168 hours. No results for  input(s): "AMMONIA" in the last 168 hours. Coagulation Profile: No results for input(s): "INR", "PROTIME" in the last 168 hours. Cardiac Enzymes: No results for input(s): "CKTOTAL", "CKMB", "CKMBINDEX", "TROPONINI" in the last 168 hours. BNP (last 3 results) No results for input(s): "PROBNP" in the last 8760 hours. HbA1C: No results for input(s): "HGBA1C" in the last 72 hours. CBG: Recent Labs  Lab 02/05/23 2032  GLUCAP 116*   Lipid Profile: No results for input(s): "CHOL", "HDL", "LDLCALC", "TRIG", "CHOLHDL", "LDLDIRECT" in the last 72 hours. Thyroid Function Tests: Recent Labs    02/05/23 2010  TSH 2.895   Anemia Panel: No results for input(s): "VITAMINB12", "FOLATE", "FERRITIN", "TIBC", "IRON", "RETICCTPCT" in the last 72 hours. Urine analysis:    Component Value Date/Time   COLORURINE AMBER (A) 10/07/2020 1413   APPEARANCEUR CLOUDY (A) 10/07/2020 1413   LABSPEC 1.033 (H) 10/07/2020 1413   PHURINE 5.0 10/07/2020 1413   GLUCOSEU NEGATIVE 10/07/2020 1413   HGBUR NEGATIVE 10/07/2020 1413   BILIRUBINUR NEGATIVE 10/07/2020 1413   KETONESUR 5 (A) 10/07/2020 1413   PROTEINUR 30 (A) 10/07/2020 1413   NITRITE NEGATIVE 10/07/2020 1413   LEUKOCYTESUR LARGE (A) 10/07/2020 1413    Radiological Exams on Admission: MR BRAIN WO CONTRAST  Result Date: 02/06/2023 CLINICAL DATA:  Left-sided numbness EXAM: MRI HEAD WITHOUT CONTRAST TECHNIQUE: Multiplanar, multiecho pulse sequences of the brain and surrounding structures were obtained without intravenous contrast. COMPARISON:  02/01/2016 FINDINGS: Brain: Small  acute infarct of the lateral right thalamus. Is in close proximity to the posterior limb of the internal capsule. No acute or chronic hemorrhage. There is multifocal hyperintense T2-weighted signal within the white matter. Parenchymal volume and CSF spaces are normal. The midline structures are normal. Vascular: Major flow voids are preserved. Skull and upper cervical spine: Normal calvarium and skull base. Visualized upper cervical spine and soft tissues are normal. Sinuses/Orbits:No paranasal sinus fluid levels or advanced mucosal thickening. No mastoid or middle ear effusion. Normal orbits. IMPRESSION: 1. 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 No hemorrhage or mass effect. 2. Findings of chronic small vessel ischemia. Electronically Signed   By: Deatra Robinson M.D.   On: 02/06/2023 03:48   CT Head Wo Contrast  Result Date: 02/05/2023 CLINICAL DATA:  Acute stroke suspected. Left face, arm and leg numbness. EXAM: CT HEAD WITHOUT CONTRAST TECHNIQUE: Contiguous axial images were obtained from the base of the skull through the vertex without intravenous contrast. RADIATION DOSE REDUCTION: This exam was performed according to the departmental dose-optimization program which includes automated exposure control, adjustment of the mA and/or kV according to patient size and/or use of iterative reconstruction technique. COMPARISON:  MRI brain 02/01/2016 FINDINGS: Brain: No evidence of acute infarction, hemorrhage, hydrocephalus, extra-axial collection or mass lesion/mass effect. There is mild diffuse atrophy. Vascular: Atherosclerotic calcifications are present within the cavernous internal carotid arteries. Skull: Normal. Negative for fracture or focal lesion. Sinuses/Orbits: No acute finding. Other: None. IMPRESSION: 1. No acute intracranial abnormality. 2. Mild diffuse atrophy. Electronically Signed   By: Darliss Cheney M.D.   On: 02/05/2023  20:35    EKG: Independently reviewed.  Sinus rhythm, no significant change compared to prior tracings.  Assessment and Plan  Acute CVA Patient presenting with complaints of left-sided upper and lower extremity weakness/paresthesias and left-sided facial numbness.  Symptoms have improved and no focal neurodeficit on exam at this time.  MRI showing a small acute infarct of the lateral right  thalamus in close proximity to the posterior limb of the internal capsule; no hemorrhage or mass effect. -Neurology consulted, appreciate recommendations -Telemetry monitoring -Echocardiogram -Hemoglobin A1c, fasting lipid panel -Antiplatelet therapy per neurology recommendations. -Frequent neurochecks -PT, OT, speech therapy. -N.p.o. until cleared by bedside swallow evaluation or formal speech evaluation  Hypertension Allow permissive hypertension at this time in the setting of acute stroke.  Hyperlipidemia Continue ezetimibe and pravastatin.  Type 2 diabetes A1c 6.2 on labs done a year ago.  Repeat A1c, sensitive sliding scale insulin ACHS.  GERD Continue PPI.  Anxiety/depression Continue Cymbalta and Xanax PRN.  Asthma Stable, no signs of acute exacerbation.  Albuterol as needed.  DVT prophylaxis: Lovenox Code Status: Full Code (discussed with the patient) Family Communication: Son-in-law at bedside. Level of care: Telemetry bed Admission status: It is my clinical opinion that referral for OBSERVATION is reasonable and necessary in this patient based on the above information provided. The aforementioned taken together are felt to place the patient at high risk for further clinical deterioration. However, it is anticipated that the patient may be medically stable for discharge from the hospital within 24 to 48 hours.   John Giovanni MD [facility]  If 7PM-7AM, please contact night-coverage [email or web address]  02/06/2023, 4:35 AM

## 2023-02-06 NOTE — Discharge Summary (Signed)
Physician Discharge Summary  Candice Reid ZOX:[id number] DOB: 1940/10/17 DOA: 02/05/2023  PCP: Candice Beams, MD  Admit date: 02/05/2023 Discharge date: 02/06/2023 Admitted From: Home Disposition: Home Recommendations for Outpatient Follow-up:  Follow up with PCP in 1 to 2 weeks Outpatient follow-up with neurology in 4 to 6 weeks Check CMP and CBC at follow-up Please follow up on the following pending results: None  Home Health: Not indicated Equipment/Devices: Not indicated  Discharge Condition: Stable CODE STATUS: Full code  Follow-up Information     Candice Beams, MD. Schedule an appointment as soon as possible for a visit in 1 week(s).   Specialty: Family Medicine Contact information: [address] [place] [address] [phone]         [facility]. Schedule an appointment as soon as possible for a visit in 4 week(s).   Specialty: Radiology Contact information: [address] [place] [address] [phone]                Hospital course 82 year old F with PMH of OSA not on CPAP, DM-2, HTN, breast cancer, GERD, anemia, anxiety, depression and morbid obesity presenting with left-sided weakness and numbness, and found to have small acute right thalamic CVA as noted on MRI brain.  GI consulted.  She was outside tPA window on arrival to ED.  CT angio head and neck with no emergent findings but some intracranial and extracranial atherosclerotic changes.  TTE without significant finding.  A1c 6.9%.  LDL 96.  The next day, symptoms improved.  Evaluated by neurology and cleared for discharge on aspirin and Plavix for 3 months followed by aspirin alone.  She was also started on Crestor.  Neurology recommended outpatient follow-up in 4 to 6 weeks.  She also was evaluated by therapy and cleared for discharge.  See individual problem list below for more.   Problems addressed during this  hospitalization Principal Problem:   Acute CVA (cerebrovascular accident) [facility]) Active Problems:   Morbid obesity (HCC)   HTN (hypertension)   HLD (hyperlipidemia)   Type 2 diabetes mellitus (HCC)   GERD (gastroesophageal reflux disease)   Asthma, chronic   OSA (obstructive sleep apnea)   Body mass index is 37.35 kg/m.            Time spent 35 minutes  Vital signs Vitals:   02/06/23 0936 02/06/23 0945 02/06/23 1000 02/06/23 1015  BP: 131/77 (!) 146/84 (!) 143/81   Pulse: 80 75 78 78  Temp: 98.2 F (36.8 C)     Resp: 20 18 (!) 22 19  Height:      Weight:      SpO2: 99% 96% 95% 96%  TempSrc: Axillary     BMI (Calculated):         Discharge exam  GENERAL: No apparent distress.  Nontoxic. HEENT: MMM.  Vision and hearing grossly intact.  NECK: Supple.  No apparent JVD.  RESP:  No IWOB.  Fair aeration bilaterally. CVS:  RRR. Heart sounds normal.  ABD/GI/GU: BS+. Abd soft, NTND.  MSK/EXT:  Moves extremities. No apparent deformity. No edema.  SKIN: no apparent skin lesion or wound NEURO: Awake and alert. Oriented appropriately.  No apparent focal neuro deficit other than subtle left upper extremity and lower extremity paresthesia. PSYCH: Calm. Normal affect.   Discharge Instructions Discharge Instructions     Diet - low sodium heart healthy   Complete by: As directed    Diet Carb Modified   Complete  by: As directed    Discharge instructions   Complete by: As directed    It has been a pleasure taking care of you!  You were hospitalized due to acute stroke for which you have been started on medication to prevent further stroke.  It is very important that you take your medications as prescribed.  Follow-up with your primary care doctor in 1 to 2 weeks or sooner if needed.  Follow-up with neurology in 4 to 6 weeks.    Take care,   Increase activity slowly   Complete by: As directed       Allergies as of 02/06/2023       Reactions   Ciprofloxacin     Unknown reaction   Oxycodone-acetaminophen Nausea Only   Tyloxapol Nausea And Vomiting   Ace Inhibitors Cough   Metronidazole Nausea Only   Morphine And Related Itching   Septra [sulfamethoxazole-trimethoprim] Nausea Only   Sulfa Antibiotics Nausea Only   Valium [diazepam] Other (See Comments)   "Brain fog"        Medication List     STOP taking these medications    pravastatin 40 MG tablet Commonly known as: PRAVACHOL       TAKE these medications    acetaminophen 650 MG CR tablet Commonly known as: TYLENOL Take 1,300 mg by mouth daily.   ALPRAZolam 0.5 MG tablet Commonly known as: XANAX Take 0.25 mg by mouth 2 (two) times daily as needed for anxiety.   cholecalciferol 1000 units tablet Commonly known as: VITAMIN D Take 1,000 Units by mouth daily.   clopidogrel 75 MG tablet Commonly known as: PLAVIX Take 1 tablet (75 mg total) by mouth daily. Start taking on: Feb 07, 2023   DULoxetine 60 MG capsule Commonly known as: CYMBALTA Take 60 mg by mouth daily.   ezetimibe 10 MG tablet Commonly known as: ZETIA Take 10 mg by mouth daily.   lansoprazole 30 MG capsule Commonly known as: PREVACID Take 30 mg by mouth daily.   LIDOCAINE-MENTHOL ROLL-ON EX Apply 1 application. topically daily as needed (shoulder pain).   metoprolol succinate 100 MG 24 hr tablet Commonly known as: TOPROL-XL Take 100 mg by mouth daily.   olmesartan 20 MG tablet Commonly known as: BENICAR Take 1 tablet (20 mg total) by mouth daily. Start taking on: Feb 10, 2023 What changed: These instructions start on Feb 10, 2023. If you are unsure what to do until then, ask your doctor or other care provider.   Omega-3 1000 MG Caps Take 1,000 mg by mouth daily.   rosuvastatin 20 MG tablet Commonly known as: CRESTOR Take 1 tablet (20 mg total) by mouth daily.   sitaGLIPtin 100 MG tablet Commonly known as: JANUVIA Take 100 mg by mouth daily.   tiZANidine 2 MG tablet Commonly known as:  ZANAFLEX Take 1 tablet (2 mg total) by mouth every 6 (six) hours as needed. What changed:  how much to take when to take this reasons to take this        Consultations: Neurology  Procedures/Studies:   ECHOCARDIOGRAM COMPLETE  Result Date: 02/06/2023    ECHOCARDIOGRAM REPORT   Patient Name:   Candice Reid Date of Exam: 02/06/2023 Medical Rec #:  [id number]        Height:       64.5 in Accession #:    [id number]       Weight:       221.0 lb Date of Birth:  [birth year]  BSA:          2.052 m Patient Age:    82 years         BP:           143/81 mmHg Patient Gender: F                HR:           79 bpm. Exam Location:  Inpatient Procedure: 2D Echo, Color Doppler and Cardiac Doppler Indications:    Stroke  History:        Patient has prior history of Echocardiogram examinations, most                 recent 08/15/2019. Stroke; Risk Factors:Hypertension, Diabetes                 and Dyslipidemia.  Sonographer:    Milbert Coulter Referring Phys: [id number] John Giovanni  Sonographer Comments: Patient is obese. Image acquisition challenging due to patient body habitus. IMPRESSIONS  1. Left ventricular ejection fraction, by estimation, is 60 to 65%. The left ventricle has normal function. The left ventricle has no regional wall motion abnormalities. Left ventricular diastolic parameters are consistent with Grade I diastolic dysfunction (impaired relaxation).  2. Right ventricular systolic function is normal. The right ventricular size is normal. Tricuspid regurgitation signal is inadequate for assessing PA pressure.  3. The mitral valve was not well visualized. No evidence of mitral valve regurgitation.  4. The aortic valve was not well visualized. Aortic valve regurgitation is not visualized.  5. The inferior vena cava is normal in size with greater than 50% respiratory variability, suggesting right atrial pressure of 3 mmHg. FINDINGS  Left Ventricle: Left ventricular ejection fraction, by  estimation, is 60 to 65%. The left ventricle has normal function. The left ventricle has no regional wall motion abnormalities. The left ventricular internal cavity size was normal in size. There is  no left ventricular hypertrophy. Left ventricular diastolic parameters are consistent with Grade I diastolic dysfunction (impaired relaxation). Right Ventricle: The right ventricular size is normal. Right ventricular systolic function is normal. Tricuspid regurgitation signal is inadequate for assessing PA pressure. Left Atrium: Left atrial size was normal in size. Right Atrium: Right atrial size was normal in size. Pericardium: There is no evidence of pericardial effusion. Mitral Valve: The mitral valve was not well visualized. No evidence of mitral valve regurgitation. Tricuspid Valve: Tricuspid valve regurgitation is not demonstrated. Aortic Valve: The aortic valve was not well visualized. Aortic valve regurgitation is not visualized. Aortic valve mean gradient measures 4.0 mmHg. Aortic valve peak gradient measures 7.8 mmHg. Aortic valve area, by VTI measures 2.08 cm. Pulmonic Valve: Pulmonic valve regurgitation is not visualized. Aorta: The aortic root and ascending aorta are structurally normal, with no evidence of dilitation. Venous: The inferior vena cava is normal in size with greater than 50% respiratory variability, suggesting right atrial pressure of 3 mmHg. IAS/Shunts: The interatrial septum was not well visualized.  LEFT VENTRICLE PLAX 2D LVIDd:         4.20 cm     Diastology LVIDs:         2.40 cm     LV e' medial:    7.40 cm/s LV PW:         0.70 cm     LV E/e' medial:  9.1 LV IVS:        1.00 cm     LV e' lateral:   9.36 cm/s LVOT diam:  1.80 cm     LV E/e' lateral: 7.2 LV SV:         54 LV SV Index:   26 LVOT Area:     2.54 cm  LV Volumes (MOD) LV vol d, MOD A2C: 39.2 ml LV vol d, MOD A4C: 25.8 ml LV vol s, MOD A2C: 12.5 ml LV vol s, MOD A4C: 11.0 ml LV SV MOD A2C:     26.7 ml LV SV MOD A4C:      25.8 ml LV SV MOD BP:      19.6 ml RIGHT VENTRICLE RV S prime:     13.20 cm/s TAPSE (M-mode): 1.9 cm LEFT ATRIUM             Index LA diam:        2.90 cm 1.41 cm/m LA Vol (A2C):   39.9 ml 19.44 ml/m LA Vol (A4C):   24.2 ml 11.79 ml/m LA Biplane Vol: 33.2 ml 16.18 ml/m  AORTIC VALVE AV Area (Vmax):    1.72 cm AV Area (Vmean):   1.71 cm AV Area (VTI):     2.08 cm AV Vmax:           140.00 cm/s AV Vmean:          94.600 cm/s AV VTI:            0.259 m AV Peak Grad:      7.8 mmHg AV Mean Grad:      4.0 mmHg LVOT Vmax:         94.90 cm/s LVOT Vmean:        63.400 cm/s LVOT VTI:          0.212 m LVOT/AV VTI ratio: 0.82  AORTA Ao Root diam: 2.80 cm Ao Asc diam:  3.20 cm MITRAL VALVE MV Area (PHT): 3.23 cm    SHUNTS MV Decel Time: 235 msec    Systemic VTI:  0.21 m MV E velocity: 67.10 cm/s  Systemic Diam: 1.80 cm MV A velocity: 94.90 cm/s MV E/A ratio:  0.71 [place] signed by Carolan Clines Signature Date/Time: 02/06/2023/1:56:34 PM    Final    CT ANGIO HEAD NECK W WO CM  Result Date: 02/06/2023 CLINICAL DATA:  Stroke, determine embolic source. EXAM: CT ANGIOGRAPHY HEAD AND NECK WITH AND WITHOUT CONTRAST TECHNIQUE: Multidetector CT imaging of the head and neck was performed using the standard protocol during bolus administration of intravenous contrast. Multiplanar CT image reconstructions and MIPs were obtained to evaluate the vascular anatomy. Carotid stenosis measurements (when applicable) are obtained utilizing NASCET criteria, using the distal internal carotid diameter as the denominator. RADIATION DOSE REDUCTION: This exam was performed according to the departmental dose-optimization program which includes automated exposure control, adjustment of the mA and/or kV according to patient size and/or use of iterative reconstruction technique. CONTRAST:  75mL OMNIPAQUE IOHEXOL 350 MG/ML SOLN COMPARISON:  Brain MRI from earlier today FINDINGS: CT HEAD FINDINGS Brain: The acute thalamic infarct is  occult by head CT. No hemorrhage or evidence of progression. No hydrocephalus. Vascular: No hyperdense vessel or unexpected calcification. Skull: Normal. Negative for fracture or focal lesion. Sinuses/Orbits: No acute finding. Review of the MIP images confirms the above findings CTA NECK FINDINGS Aortic arch: Atheromatous calcification with 2 vessel branching. Right carotid system: No evidence of dissection, stenosis (50% or greater), or occlusion. Partial retropharyngeal course. Mild atheromatous plaque at the bifurcation. Left carotid system: No evidence of dissection, stenosis (50% or greater), or occlusion. Partial retropharyngeal  course. Mild atheromatous plaque at the bifurcation. Vertebral arteries: No proximal subclavian stenosis. Atheromatous calcification at the vertebral origins with 40-50% narrowing on the left. The vertebral arteries are smoothly contoured at levels not affected by streak artifact. Skeleton: No acute or aggressive finding Other neck: Benign soft tissues Upper chest: Clear apical lungs. Review of the MIP images confirms the above findings CTA HEAD FINDINGS Anterior circulation: Atheromatous calcification of the carotid siphons. No branch occlusion, beading, or aneurysm. Posterior circulation: The vertebral and basilar arteries are smoothly contoured and widely patent. Atheromatous irregularity of the posterior cerebral arteries with moderate P3 segment stenosis on the right. Venous sinuses: Unremarkable Anatomic variants: None significant Review of the MIP images confirms the above findings IMPRESSION: 1. No emergent finding. 2. Fairly mild atherosclerosis in the neck although left vertebral origin stenosis approaches 50%. 3. Intracranial atherosclerosis especially affecting posterior cerebral arteries. Electronically Signed   By: Tiburcio Pea M.D.   On: 02/06/2023 07:09   MR BRAIN WO CONTRAST  Result Date: 02/06/2023 CLINICAL DATA:  Left-sided numbness EXAM: MRI HEAD WITHOUT  CONTRAST TECHNIQUE: Multiplanar, multiecho pulse sequences of the brain and surrounding structures were obtained without intravenous contrast. COMPARISON:  02/01/2016 FINDINGS: Brain: Small acute infarct of the lateral right thalamus. Is in close proximity to the posterior limb of the internal capsule. No acute or chronic hemorrhage. There is multifocal hyperintense T2-weighted signal within the white matter. Parenchymal volume and CSF spaces are normal. The midline structures are normal. Vascular: Major flow voids are preserved. Skull and upper cervical spine: Normal calvarium and skull base. Visualized upper cervical spine and soft tissues are normal. Sinuses/Orbits:No paranasal sinus fluid levels or advanced mucosal thickening. No mastoid or middle ear effusion. Normal orbits. IMPRESSION: 1. 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 No hemorrhage or mass effect. 2. Findings of chronic small vessel ischemia. Electronically Signed   By: Deatra Robinson M.D.   On: 02/06/2023 03:48   CT Head Wo Contrast  Result Date: 02/05/2023 CLINICAL DATA:  Acute stroke suspected. Left face, arm and leg numbness. EXAM: CT HEAD WITHOUT CONTRAST TECHNIQUE: Contiguous axial images were obtained from the base of the skull through the vertex without intravenous contrast. RADIATION DOSE REDUCTION: This exam was performed according to the departmental dose-optimization program which includes automated exposure control, adjustment of the mA and/or kV according to patient size and/or use of iterative reconstruction technique. COMPARISON:  MRI brain 02/01/2016 FINDINGS: Brain: No evidence of acute infarction, hemorrhage, hydrocephalus, extra-axial collection or mass lesion/mass effect. There is mild diffuse atrophy. Vascular: Atherosclerotic calcifications are present within the cavernous internal carotid arteries. Skull: Normal. Negative for fracture or focal  lesion. Sinuses/Orbits: No acute finding. Other: None. IMPRESSION: 1. No acute intracranial abnormality. 2. Mild diffuse atrophy. Electronically Signed   By: Darliss Cheney M.D.   On: 02/05/2023 20:35       The results of significant diagnostics from this hospitalization (including imaging, microbiology, ancillary and laboratory) are listed below for reference.     Microbiology: No results found for this or any previous visit (from the past 240 hour(s)).   Labs:  CBC: Recent Labs  Lab 02/05/23 2010  WBC 10.3  NEUTROABS 5.9  HGB 12.5  HCT 38.6  MCV 88.1  PLT 249   BMP &GFR Recent Labs  Lab 02/05/23 2010  NA 135  K 3.6  CL 98  CO2 25  GLUCOSE 122*  BUN 18  CREATININE  0.93  CALCIUM 8.8*   Estimated Creatinine Clearance: 55.1 mL/min (by C-G formula based on SCr of 0.93 mg/dL). Liver & Pancreas: Recent Labs  Lab 02/05/23 2010  AST 25  ALT 18  ALKPHOS 71  BILITOT 0.3  PROT 7.3  ALBUMIN 3.7   No results for input(s): "LIPASE", "AMYLASE" in the last 168 hours. No results for input(s): "AMMONIA" in the last 168 hours. Diabetic: Recent Labs    02/06/23 0542  HGBA1C 6.9*   Recent Labs  Lab 02/05/23 2032 02/06/23 0848 02/06/23 1455  GLUCAP 116* 115* 106*   Cardiac Enzymes: No results for input(s): "CKTOTAL", "CKMB", "CKMBINDEX", "TROPONINI" in the last 168 hours. No results for input(s): "PROBNP" in the last 8760 hours. Coagulation Profile: No results for input(s): "INR", "PROTIME" in the last 168 hours. Thyroid Function Tests: Recent Labs    02/05/23 2010  TSH 2.895   Lipid Profile: Recent Labs    02/06/23 1340  CHOL 198  HDL 55  LDLCALC 96  TRIG 233*  CHOLHDL 3.6   Anemia Panel: No results for input(s): "VITAMINB12", "FOLATE", "FERRITIN", "TIBC", "IRON", "RETICCTPCT" in the last 72 hours. Urine analysis:    Component Value Date/Time   COLORURINE AMBER (A) 10/07/2020 1413   APPEARANCEUR CLOUDY (A) 10/07/2020 1413   LABSPEC 1.033 (H)  10/07/2020 1413   PHURINE 5.0 10/07/2020 1413   GLUCOSEU NEGATIVE 10/07/2020 1413   HGBUR NEGATIVE 10/07/2020 1413   BILIRUBINUR NEGATIVE 10/07/2020 1413   KETONESUR 5 (A) 10/07/2020 1413   PROTEINUR 30 (A) 10/07/2020 1413   NITRITE NEGATIVE 10/07/2020 1413   LEUKOCYTESUR LARGE (A) 10/07/2020 1413   Sepsis Labs: Invalid input(s): "PROCALCITONIN", "LACTICIDVEN"   SIGNED:  Almon Hercules, MD  [facility] 02/06/2023, 3:20 PM

## 2023-02-06 NOTE — Evaluation (Signed)
Physical Therapy Evaluation and Discharge Patient Details Name: Candice Reid MRN: [id number] DOB: 10-Oct-1940 Today's Date: 02/06/2023  History of Present Illness  82 y.o. female presented 02/05/23 with strokelike symptoms including left-sided weakness and paresthesias. 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 Patient able to transfer and ambulate with rollator with minguard assist--primarily requiring close guarding due to recent ativan (for MRI). Anticipate pt will be at her baseline when ativan wears off. Son present and agrees with someone staying with pt at least 24 hours if discharged today (in part to assist with her dog). All education has been completed and the patient has no further questions.  PT is signing off. Thank you for this referral.        Recommendations for follow up therapy are one component of a multi-disciplinary discharge planning process, led by the attending physician.  Recommendations may be updated based on patient status, additional functional criteria and insurance authorization.  Follow Up Recommendations       Assistance Recommended at Discharge Intermittent Supervision/Assistance (initial few days)  Patient can return home with the following  A little help with walking and/or transfers;Assistance with cooking/housework;Help with stairs or ramp for entrance;Assist for transportation    Equipment Recommendations None recommended by PT  Recommendations for Other Services       Functional Status Assessment Patient has had a recent decline in their functional status and demonstrates the ability to make significant  improvements in function in a reasonable and predictable amount of time.     Precautions / Restrictions Precautions Precautions: Fall Precaution Comments: reports 2 falls in the past year--each time she did not have her walker      Mobility  Bed Mobility Overal bed mobility: Needs Assistance Bed Mobility: Supine to Sit, Sit to Supine     Supine to sit: Min assist, HOB elevated Sit to supine: Supervision   General bed mobility comments: pt on ED stretcher; needed assist to raise torso    Transfers Overall transfer level: Needs assistance Equipment used: None Transfers: Sit to/from Stand Sit to Stand: Min guard           General transfer comment: pt recently had ativan due to claustrophobia and went to MRI; slightly woozy    Ambulation/Gait Ambulation/Gait assistance: Min guard, Min assist Gait Distance (Feet): 100 Feet Assistive device: Rollator (4 wheels), None Gait Pattern/deviations: Step-through pattern, Trendelenburg   Gait velocity interpretation: 1.31 - 2.62 ft/sec, indicative of limited community ambulator   General Gait Details: initial 10 ft with R HHA; pt clarified that she uses rollator all the time inside the house and rollator brought to her after one episode of imbalance requiring min assist (due to wooziness); with rollator minguard assist but without imbalance  Stairs            Wheelchair Mobility    Modified Rankin (Stroke Patients Only) Modified Rankin (Stroke Patients Only) Pre-Morbid Rankin Score: No symptoms Modified Rankin: No significant disability     Balance Overall balance assessment: Mild deficits observed, not formally tested (pt feels LOB due to recent ativan)  Pertinent Vitals/Pain Pain Assessment Pain Assessment: No/denies pain    Home Living Family/patient expects to be discharged to:: Private residence Living Arrangements: Alone Available Help at  Discharge: Family;Available 24 hours/day Type of Home: House Home Access: Stairs to enter Entrance Stairs-Rails: Right;Left (cannot reach both) Entrance Stairs-Number of Steps: 3   Home Layout: One level Home Equipment: Rollator (4 wheels);Tub bench;Grab bars - tub/shower;Grab bars - toilet;Cane - single point      Prior Function Prior Level of Function : Independent/Modified Independent;Driving             Mobility Comments: More recent use of rollator. Was using in home for safety to reduce risk of tripping over her dog. Was not using in community until recently (when LLE numbness/weakness began). ADLs Comments: independent in ADL, IADL, driving     Hand Dominance   Dominant Hand: Right    Extremity/Trunk Assessment   Upper Extremity Assessment Upper Extremity Assessment: Defer to OT evaluation    Lower Extremity Assessment Lower Extremity Assessment: LLE deficits/detail LLE Deficits / Details: slight lt trendelenburg with gait LLE Sensation: WNL    Cervical / Trunk Assessment Cervical / Trunk Assessment: Other exceptions Cervical / Trunk Exceptions: obese  Communication   Communication: No difficulties  Cognition Arousal/Alertness: Awake/alert Behavior During Therapy: WFL for tasks assessed/performed Overall Cognitive Status: Within Functional Limits for tasks assessed                                          General Comments General comments (skin integrity, edema, etc.): Son present. Reports someone will stay with her overnight initially    Exercises     Assessment/Plan    PT Assessment Patient does not need any further PT services  PT Problem List         PT Treatment Interventions      PT Goals (Current goals can be found in the Care Plan section)  Acute Rehab PT Goals Patient Stated Goal: go home soon PT Goal Formulation: All assessment and education complete, DC therapy    Frequency       Co-evaluation                AM-PAC PT "6 Clicks" Mobility  Outcome Measure Help needed turning from your back to your side while in a flat bed without using bedrails?: None Help needed moving from lying on your back to sitting on the side of a flat bed without using bedrails?: A Little Help needed moving to and from a bed to a chair (including a wheelchair)?: A Little Help needed standing up from a chair using your arms (e.g., wheelchair or bedside chair)?: A Little Help needed to walk in hospital room?: A Little Help needed climbing 3-5 steps with a railing? : A Little 6 Click Score: 19    End of Session   Activity Tolerance: Patient tolerated treatment well Patient left: in bed;with call bell/phone within reach;with family/visitor present   PT Visit Diagnosis: Unsteadiness on feet (R26.81)    Time: 1610-9604 PT Time Calculation (min) (ACUTE ONLY): 27 min   Charges:   PT Evaluation $PT Eval Low Complexity: 1 Low           Jerolyn Center, PT Acute Rehabilitation Services  Office [phone]   Zena Amos 02/06/2023, 8:56 AM

## 2023-02-06 NOTE — ED Notes (Signed)
Patient transported to MRI 

## 2023-02-06 NOTE — ED Notes (Signed)
Pt transported to vascular.  °

## 2023-02-06 NOTE — ED Notes (Signed)
ED TO INPATIENT HANDOFF REPORT  ED Nurse Name and Phone #: Mo, RN [phone]  S Name/Age/Gender Candice Reid 82 y.o. female Room/Bed: 002C/002C  Code Status   Code Status: Full Code  Home/SNF/Other Home Patient oriented to: self, place, time, and situation Is this baseline? Yes   Triage Complete: Triage complete  Chief Complaint Acute CVA (cerebrovascular accident) [facility]) [I63.9]  Triage Note Pt reports a progression of sxs that include LLE tingling sometime this morning; LUE and LT side facial tingling x 2 hrs; slurring speech on the way to ED; speech is normal now; reports noticed her LT foot was dragging yesterday; sxs resolved and then started again this morning (best estimate is 0900 per pt)   Allergies Allergies  Allergen Reactions   Ciprofloxacin     Unknown reaction   Oxycodone-Acetaminophen Nausea Only   Tyloxapol Nausea And Vomiting   Ace Inhibitors Cough   Metronidazole Nausea Only   Morphine And Related Itching   Septra [Sulfamethoxazole-Trimethoprim] Nausea Only   Sulfa Antibiotics Nausea Only   Valium [Diazepam] Other (See Comments)    "Brain fog"    Level of Care/Admitting Diagnosis ED Disposition     ED Disposition  Admit   Condition  --   Comment  Hospital Area: [facility] [[id number]]  Level of Care: Telemetry Medical [104]  May place patient in observation at [facility] or [facility] if equivalent level of care is available:: Yes  Covid Evaluation: Asymptomatic - no recent exposure (last 10 days) testing not required  Diagnosis: Acute CVA (cerebrovascular accident) [facility]) [[id number]]  Admitting Physician: John Giovanni [[id number]]  Attending Physician: John Giovanni [[id number]]          B Medical/Surgery History Past Medical History:  Diagnosis Date   Anemia    Anxiety                                                                                                                                                                                                                                                                    Arthritis    Asthma    Breast cancer (HCC) 2015   Complication of anesthesia    shaky-hard to wake up, blood pressure dropped   Dental bridge present  Depression    Diabetes (HCC)    Fatty liver    GERD (gastroesophageal reflux disease)    Hemorrhoids    History of kidney stones    Hot flashes    HTN (hypertension)    Hyperlipemia    Hypertension    Kidney stones    Neuropathy    Obesity    Obstructive sleep apnea    Osteoarthritis bilteral knees   Personal history of radiation therapy 2015   Phobia    Plantar fasciitis    Radiation 05/14/2014   44 Gy Left breast   Wears glasses    reading   Past Surgical History:  Procedure Laterality Date   BREAST LUMPECTOMY  1985   left-neg   BREAST LUMPECTOMY Left 03/11/2014   left   COLONOSCOPY  03/11/2011   diverticulosis, internal hemorrhoids   CYSTOSCOPY W/ URETERAL STENT PLACEMENT     removed   DILATION AND CURETTAGE OF UTERUS     IR ABLATE LIVER CRYOABLATION  04/17/2020   IR RADIOLOGIST EVAL & MGMT  04/03/2020   IR RADIOLOGIST EVAL & MGMT  05/22/2020   KNEE SURGERY Left    x 2-left   OOPHORECTOMY Bilateral    TOTAL KNEE ARTHROPLASTY Right 10/17/2020   Procedure: TOTAL KNEE ARTHROPLASTY;  Surgeon: Jodi Geralds, MD;  Location: [facility];  Service: Orthopedics;  Laterality: Right;   TOTAL KNEE ARTHROPLASTY Left 01/29/2022   Procedure: TOTAL KNEE ARTHROPLASTY;  Surgeon: Jodi Geralds, MD;  Location: [facility];  Service: Orthopedics;  Laterality: Left;   VAGINAL HYSTERECTOMY  1988   endometriosis, fibroids     A IV Location/Drains/Wounds Patient Lines/Drains/Airways Status     Active Line/Drains/Airways     Name Placement date Placement time Site Days   Peripheral IV 02/05/23 20 G Right Antecubital 02/05/23  2009  Antecubital  1            Intake/Output Last 24 hours No intake or output data in the 24 hours ending  02/06/23 1351  Labs/Imaging Results for orders placed or performed during the hospital encounter of 02/05/23 (from the past 48 hour(s))  CBC with Differential     Status: None   Collection Time: 02/05/23  8:10 PM  Result Value Ref Range   WBC 10.3 4.0 - 10.5 K/uL   RBC 4.38 3.87 - 5.11 MIL/uL   Hemoglobin 12.5 12.0 - 15.0 g/dL   HCT 16.1 09.6 - 04.5 %   MCV 88.1 80.0 - 100.0 fL   MCH 28.5 26.0 - 34.0 pg   MCHC 32.4 30.0 - 36.0 g/dL   RDW 40.9 81.1 - 91.4 %   Platelets 249 150 - 400 K/uL   nRBC 0.0 0.0 - 0.2 %   Neutrophils Relative % 58 %   Neutro Abs 5.9 1.7 - 7.7 K/uL   Lymphocytes Relative 32 %   Lymphs Abs 3.2 0.7 - 4.0 K/uL   Monocytes Relative 7 %   Monocytes Absolute 0.7 0.1 - 1.0 K/uL   Eosinophils Relative 2 %   Eosinophils Absolute 0.3 0.0 - 0.5 K/uL   Basophils Relative 0 %   Basophils Absolute 0.0 0.0 - 0.1 K/uL   Immature Granulocytes 1 %   Abs Immature Granulocytes 0.06 0.00 - 0.07 K/uL    Comment: Performed at [facility], [address] [place], [place] [address]  Comprehensive metabolic panel     Status: Abnormal   Collection Time: 02/05/23  8:10 PM  Result Value Ref Range  Sodium 135 135 - 145 mmol/L   Potassium 3.6 3.5 - 5.1 mmol/L   Chloride 98 98 - 111 mmol/L   CO2 25 22 - 32 mmol/L   Glucose, Bld 122 (H) 70 - 99 mg/dL    Comment: Glucose reference range applies only to samples taken after fasting for at least 8 hours.   BUN 18 8 - 23 mg/dL   Creatinine, Ser 7.82 0.44 - 1.00 mg/dL   Calcium 8.8 (L) 8.9 - 10.3 mg/dL   Total Protein 7.3 6.5 - 8.1 g/dL   Albumin 3.7 3.5 - 5.0 g/dL   AST 25 15 - 41 U/L   ALT 18 0 - 44 U/L   Alkaline Phosphatase 71 38 - 126 U/L   Total Bilirubin 0.3 0.3 - 1.2 mg/dL   GFR, Estimated >95 >62 mL/min    Comment: (NOTE) Calculated using the CKD-EPI Creatinine Equation (2021)    Anion gap 12 5 - 15    Comment: Performed at [facility], [address] [place], [place] [address]  TSH     Status:  None   Collection Time: 02/05/23  8:10 PM  Result Value Ref Range   TSH 2.895 0.350 - 4.500 uIU/mL    Comment: Performed by a 3rd Generation assay with a functional sensitivity of <=0.01 uIU/mL. Performed at [facility], [address], [place], [place] [address]   POC CBG, ED     Status: Abnormal   Collection Time: 02/05/23  8:32 PM  Result Value Ref Range   Glucose-Capillary 116 (H) 70 - 99 mg/dL    Comment: Glucose reference range applies only to samples taken after fasting for at least 8 hours.  Hemoglobin A1c     Status: Abnormal   Collection Time: 02/06/23  5:42 AM  Result Value Ref Range   Hgb A1c MFr Bld 6.9 (H) 4.8 - 5.6 %    Comment: (NOTE) Pre diabetes:          5.7%-6.4%  Diabetes:              >6.4%  Glycemic control for   <7.0% adults with diabetes    Mean Plasma Glucose 151.33 mg/dL    Comment: Performed at [facility], [address], [place], [place] [address]  CBG monitoring, ED     Status: Abnormal   Collection Time: 02/06/23  8:48 AM  Result Value Ref Range   Glucose-Capillary 115 (H) 70 - 99 mg/dL    Comment: Glucose reference range applies only to samples taken after fasting for at least 8 hours.   CT ANGIO HEAD NECK W WO CM  Result Date: 02/06/2023 CLINICAL DATA:  Stroke, determine embolic source. EXAM: CT ANGIOGRAPHY HEAD AND NECK WITH AND WITHOUT CONTRAST TECHNIQUE: Multidetector CT imaging of the head and neck was performed using the standard protocol during bolus administration of intravenous contrast. Multiplanar CT image reconstructions and MIPs were obtained to evaluate the vascular anatomy. Carotid stenosis measurements (when applicable) are obtained utilizing NASCET criteria, using the distal internal carotid diameter as the denominator. RADIATION DOSE REDUCTION: This exam was performed according to the departmental dose-optimization program which includes automated exposure control, adjustment of the mA and/or kV according to patient  size and/or use of iterative reconstruction technique. CONTRAST:  75mL OMNIPAQUE IOHEXOL 350 MG/ML SOLN COMPARISON:  Brain MRI from earlier today FINDINGS: CT HEAD FINDINGS Brain: The acute thalamic infarct is occult by head CT. No hemorrhage or evidence of progression.  No hydrocephalus. Vascular: No hyperdense vessel or unexpected calcification. Skull: Normal. Negative for fracture or focal lesion. Sinuses/Orbits: No acute finding. Review of the MIP images confirms the above findings CTA NECK FINDINGS Aortic arch: Atheromatous calcification with 2 vessel branching. Right carotid system: No evidence of dissection, stenosis (50% or greater), or occlusion. Partial retropharyngeal course. Mild atheromatous plaque at the bifurcation. Left carotid system: No evidence of dissection, stenosis (50% or greater), or occlusion. Partial retropharyngeal course. Mild atheromatous plaque at the bifurcation. Vertebral arteries: No proximal subclavian stenosis. Atheromatous calcification at the vertebral origins with 40-50% narrowing on the left. The vertebral arteries are smoothly contoured at levels not affected by streak artifact. Skeleton: No acute or aggressive finding Other neck: Benign soft tissues Upper chest: Clear apical lungs. Review of the MIP images confirms the above findings CTA HEAD FINDINGS Anterior circulation: Atheromatous calcification of the carotid siphons. No branch occlusion, beading, or aneurysm. Posterior circulation: The vertebral and basilar arteries are smoothly contoured and widely patent. Atheromatous irregularity of the posterior cerebral arteries with moderate P3 segment stenosis on the right. Venous sinuses: Unremarkable Anatomic variants: None significant Review of the MIP images confirms the above findings IMPRESSION: 1. No emergent finding. 2. Fairly mild atherosclerosis in the neck although left vertebral origin stenosis approaches 50%. 3. Intracranial atherosclerosis especially affecting  posterior cerebral arteries. Electronically Signed   By: Tiburcio Pea M.D.   On: 02/06/2023 07:09   MR BRAIN WO CONTRAST  Result Date: 02/06/2023 CLINICAL DATA:  Left-sided numbness EXAM: MRI HEAD WITHOUT CONTRAST TECHNIQUE: Multiplanar, multiecho pulse sequences of the brain and surrounding structures were obtained without intravenous contrast. COMPARISON:  02/01/2016 FINDINGS: Brain: Small acute infarct of the lateral right thalamus. Is in close proximity to the posterior limb of the internal capsule. No acute or chronic hemorrhage. There is multifocal hyperintense T2-weighted signal within the white matter. Parenchymal volume and CSF spaces are normal. The midline structures are normal. Vascular: Major flow voids are preserved. Skull and upper cervical spine: Normal calvarium and skull base. Visualized upper cervical spine and soft tissues are normal. Sinuses/Orbits:No paranasal sinus fluid levels or advanced mucosal thickening. No mastoid or middle ear effusion. Normal orbits. IMPRESSION: 1. 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 No hemorrhage or mass effect. 2. Findings of chronic small vessel ischemia. Electronically Signed   By: Deatra Robinson M.D.   On: 02/06/2023 03:48   CT Head Wo Contrast  Result Date: 02/05/2023 CLINICAL DATA:  Acute stroke suspected. Left face, arm and leg numbness. EXAM: CT HEAD WITHOUT CONTRAST TECHNIQUE: Contiguous axial images were obtained from the base of the skull through the vertex without intravenous contrast. RADIATION DOSE REDUCTION: This exam was performed according to the departmental dose-optimization program which includes automated exposure control, adjustment of the mA and/or kV according to patient size and/or use of iterative reconstruction technique. COMPARISON:  MRI brain 02/01/2016 FINDINGS: Brain: No evidence of acute infarction, hemorrhage, hydrocephalus, extra-axial  collection or mass lesion/mass effect. There is mild diffuse atrophy. Vascular: Atherosclerotic calcifications are present within the cavernous internal carotid arteries. Skull: Normal. Negative for fracture or focal lesion. Sinuses/Orbits: No acute finding. Other: None. IMPRESSION: 1. No acute intracranial abnormality. 2. Mild diffuse atrophy. Electronically Signed   By: Darliss Cheney M.D.   On: 02/05/2023 20:35    Pending Labs Unresulted Labs (From admission, onward)     Start     Ordered   02/07/23 0500  Lipid panel  (Labs)  Tomorrow morning,   R       Comments: Fasting    02/06/23 0526   02/06/23 1252  Lipid panel  Once,   R        02/06/23 1251            Vitals/Pain Today's Vitals   02/06/23 0936 02/06/23 0945 02/06/23 1000 02/06/23 1015  BP: 131/77 (!) 146/84 (!) 143/81   Pulse: 80 75 78 78  Resp: 20 18 (!) 22 19  Temp: 98.2 F (36.8 C)     TempSrc: Axillary     SpO2: 99% 96% 95% 96%  Weight:      Height:      PainSc:        Isolation Precautions No active isolations  Medications Medications  ALPRAZolam (XANAX) tablet 0.25 mg (has no administration in time range)  DULoxetine (CYMBALTA) DR capsule 60 mg (60 mg Oral Given 02/06/23 0908)  ezetimibe (ZETIA) tablet 10 mg (10 mg Oral Given 02/06/23 0947)  pantoprazole (PROTONIX) EC tablet 40 mg (40 mg Oral Given 02/06/23 0908)  pravastatin (PRAVACHOL) tablet 40 mg (has no administration in time range)   stroke: early stages of recovery book (has no administration in time range)  acetaminophen (TYLENOL) tablet 650 mg (has no administration in time range)    Or  acetaminophen (TYLENOL) 160 MG/5ML solution 650 mg (has no administration in time range)    Or  acetaminophen (TYLENOL) suppository 650 mg (has no administration in time range)  enoxaparin (LOVENOX) injection 40 mg (40 mg Subcutaneous Given 02/06/23 0908)  insulin aspart (novoLOG) injection 0-9 Units ( Subcutaneous Not Given 02/06/23 0859)  insulin aspart  (novoLOG) injection 0-5 Units (has no administration in time range)  albuterol (PROVENTIL) (2.5 MG/3ML) 0.083% nebulizer solution 2.5 mg (has no administration in time range)  aspirin EC tablet 81 mg (81 mg Oral Given 02/06/23 0908)  clopidogrel (PLAVIX) tablet 75 mg (75 mg Oral Given 02/06/23 0908)  LORazepam (ATIVAN) injection 0.5 mg (0.5 mg Intravenous Given 02/06/23 0240)  iohexol (OMNIPAQUE) 350 MG/ML injection 75 mL (75 mLs Intravenous Contrast Given 02/06/23 1610)    Mobility walks with device     Focused Assessments    R Recommendations: See Admitting Provider Note  Report given to:   Additional Notes: Patient is A&Ox4, uses a walker at baseline. Reports no pain at this time

## 2023-02-06 NOTE — Progress Notes (Signed)
Patient has been discharged from the unit. No further requests have been made. Discharge paperwork has been reviewed with patient and family member by the bedside.

## 2023-02-06 NOTE — ED Notes (Signed)
PT at bedside.

## 2023-02-06 NOTE — Progress Notes (Signed)
STROKE TEAM PROGRESS NOTE   SUBJECTIVE (INTERVAL HISTORY) Her husband is at the bedside.  Overall her condition is rapidly improving.  Patient lying in bed, stated that she still have left foot decree sensation but otherwise much improved.   OBJECTIVE Temp:  [97.7 F (36.5 C)-98.6 F (37 C)] 98.2 F (36.8 C) (05/12 0936) Pulse Rate:  [67-83] 78 (05/12 1015) Cardiac Rhythm: Normal sinus rhythm (05/12 1501) Resp:  [15-25] 19 (05/12 1015) BP: (111-163)/(73-98) 143/81 (05/12 1000) SpO2:  [95 %-100 %] 96 % (05/12 1015) Weight:  [100.2 kg] 100.2 kg (05/11 1927)  Recent Labs  Lab 02/05/23 2032 02/06/23 0848 02/06/23 1455  GLUCAP 116* 115* 106*   Recent Labs  Lab 02/05/23 2010  NA 135  K 3.6  CL 98  CO2 25  GLUCOSE 122*  BUN 18  CREATININE 0.93  CALCIUM 8.8*   Recent Labs  Lab 02/05/23 2010  AST 25  ALT 18  ALKPHOS 71  BILITOT 0.3  PROT 7.3  ALBUMIN 3.7   Recent Labs  Lab 02/05/23 2010  WBC 10.3  NEUTROABS 5.9  HGB 12.5  HCT 38.6  MCV 88.1  PLT 249   No results for input(s): "CKTOTAL", "CKMB", "CKMBINDEX", "TROPONINI" in the last 168 hours. No results for input(s): "LABPROT", "INR" in the last 72 hours. No results for input(s): "COLORURINE", "LABSPEC", "PHURINE", "GLUCOSEU", "HGBUR", "BILIRUBINUR", "KETONESUR", "PROTEINUR", "UROBILINOGEN", "NITRITE", "LEUKOCYTESUR" in the last 72 hours.  Invalid input(s): "APPERANCEUR"     Component Value Date/Time   CHOL 198 02/06/2023 1340   TRIG 233 (H) 02/06/2023 1340   HDL 55 02/06/2023 1340   CHOLHDL 3.6 02/06/2023 1340   VLDL 47 (H) 02/06/2023 1340   LDLCALC 96 02/06/2023 1340   Lab Results  Component Value Date   HGBA1C 6.9 (H) 02/06/2023   No results found for: "LABOPIA", "COCAINSCRNUR", "LABBENZ", "AMPHETMU", "THCU", "LABBARB"  No results for input(s): "ETH" in the last 168 hours.  I have personally reviewed the radiological images below and agree with the radiology interpretations.  ECHOCARDIOGRAM  COMPLETE  Result Date: 02/06/2023    ECHOCARDIOGRAM REPORT   Patient Name:   Candice Reid Date of Exam: 02/06/2023 Medical Rec #:  [id number]        Height:       64.5 in Accession #:    [id number]       Weight:       221.0 lb Date of Birth:  October 25, 1940        BSA:          2.052 m Patient Age:    82 years         BP:           143/81 mmHg Patient Gender: F                HR:           79 bpm. Exam Location:  Inpatient Procedure: 2D Echo, Color Doppler and Cardiac Doppler Indications:    Stroke  History:        Patient has prior history of Echocardiogram examinations, most                 recent 08/15/2019. Stroke; Risk Factors:Hypertension, Diabetes                 and Dyslipidemia.  Sonographer:    Milbert Coulter Referring Phys: [id number] John Giovanni  Sonographer Comments: Patient is obese. Image acquisition challenging due to patient body habitus. IMPRESSIONS  1. Left ventricular ejection fraction, by estimation, is 60 to 65%. The left ventricle has normal function. The left ventricle has no regional wall motion abnormalities. Left ventricular diastolic parameters are consistent with Grade I diastolic dysfunction (impaired relaxation).  2. Right ventricular systolic function is normal. The right ventricular size is normal. Tricuspid regurgitation signal is inadequate for assessing PA pressure.  3. The mitral valve was not well visualized. No evidence of mitral valve regurgitation.  4. The aortic valve was not well visualized. Aortic valve regurgitation is not visualized.  5. The inferior vena cava is normal in size with greater than 50% respiratory variability, suggesting right atrial pressure of 3 mmHg. FINDINGS  Left Ventricle: Left ventricular ejection fraction, by estimation, is 60 to 65%. The left ventricle has normal function. The left ventricle has no regional wall motion abnormalities. The left ventricular internal cavity size was normal in size. There is  no left ventricular hypertrophy. Left  ventricular diastolic parameters are consistent with Grade I diastolic dysfunction (impaired relaxation). Right Ventricle: The right ventricular size is normal. Right ventricular systolic function is normal. Tricuspid regurgitation signal is inadequate for assessing PA pressure. Left Atrium: Left atrial size was normal in size. Right Atrium: Right atrial size was normal in size. Pericardium: There is no evidence of pericardial effusion. Mitral Valve: The mitral valve was not well visualized. No evidence of mitral valve regurgitation. Tricuspid Valve: Tricuspid valve regurgitation is not demonstrated. Aortic Valve: The aortic valve was not well visualized. Aortic valve regurgitation is not visualized. Aortic valve mean gradient measures 4.0 mmHg. Aortic valve peak gradient measures 7.8 mmHg. Aortic valve area, by VTI measures 2.08 cm. Pulmonic Valve: Pulmonic valve regurgitation is not visualized. Aorta: The aortic root and ascending aorta are structurally normal, with no evidence of dilitation. Venous: The inferior vena cava is normal in size with greater than 50% respiratory variability, suggesting right atrial pressure of 3 mmHg. IAS/Shunts: The interatrial septum was not well visualized.  LEFT VENTRICLE PLAX 2D LVIDd:         4.20 cm     Diastology LVIDs:         2.40 cm     LV e' medial:    7.40 cm/s LV PW:         0.70 cm     LV E/e' medial:  9.1 LV IVS:        1.00 cm     LV e' lateral:   9.36 cm/s LVOT diam:     1.80 cm     LV E/e' lateral: 7.2 LV SV:         54 LV SV Index:   26 LVOT Area:     2.54 cm  LV Volumes (MOD) LV vol d, MOD A2C: 39.2 ml LV vol d, MOD A4C: 25.8 ml LV vol s, MOD A2C: 12.5 ml LV vol s, MOD A4C: 11.0 ml LV SV MOD A2C:     26.7 ml LV SV MOD A4C:     25.8 ml LV SV MOD BP:      19.6 ml RIGHT VENTRICLE RV S prime:     13.20 cm/s TAPSE (M-mode): 1.9 cm LEFT ATRIUM             Index LA diam:        2.90 cm 1.41 cm/m LA Vol (A2C):   39.9 ml 19.44 ml/m LA Vol (A4C):   24.2 ml 11.79 ml/m LA  Biplane Vol: 33.2 ml 16.18 ml/m  AORTIC VALVE  AV Area (Vmax):    1.72 cm AV Area (Vmean):   1.71 cm AV Area (VTI):     2.08 cm AV Vmax:           140.00 cm/s AV Vmean:          94.600 cm/s AV VTI:            0.259 m AV Peak Grad:      7.8 mmHg AV Mean Grad:      4.0 mmHg LVOT Vmax:         94.90 cm/s LVOT Vmean:        63.400 cm/s LVOT VTI:          0.212 m LVOT/AV VTI ratio: 0.82  AORTA Ao Root diam: 2.80 cm Ao Asc diam:  3.20 cm MITRAL VALVE MV Area (PHT): 3.23 cm    SHUNTS MV Decel Time: 235 msec    Systemic VTI:  0.21 m MV E velocity: 67.10 cm/s  Systemic Diam: 1.80 cm MV A velocity: 94.90 cm/s MV E/A ratio:  0.71 [place] signed by Carolan Clines Signature Date/Time: 02/06/2023/1:56:34 PM    Final    CT ANGIO HEAD NECK W WO CM  Result Date: 02/06/2023 CLINICAL DATA:  Stroke, determine embolic source. EXAM: CT ANGIOGRAPHY HEAD AND NECK WITH AND WITHOUT CONTRAST TECHNIQUE: Multidetector CT imaging of the head and neck was performed using the standard protocol during bolus administration of intravenous contrast. Multiplanar CT image reconstructions and MIPs were obtained to evaluate the vascular anatomy. Carotid stenosis measurements (when applicable) are obtained utilizing NASCET criteria, using the distal internal carotid diameter as the denominator. RADIATION DOSE REDUCTION: This exam was performed according to the departmental dose-optimization program which includes automated exposure control, adjustment of the mA and/or kV according to patient size and/or use of iterative reconstruction technique. CONTRAST:  75mL OMNIPAQUE IOHEXOL 350 MG/ML SOLN COMPARISON:  Brain MRI from earlier today FINDINGS: CT HEAD FINDINGS Brain: The acute thalamic infarct is occult by head CT. No hemorrhage or evidence of progression. No hydrocephalus. Vascular: No hyperdense vessel or unexpected calcification. Skull: Normal. Negative for fracture or focal lesion. Sinuses/Orbits: No acute finding. Review of the  MIP images confirms the above findings CTA NECK FINDINGS Aortic arch: Atheromatous calcification with 2 vessel branching. Right carotid system: No evidence of dissection, stenosis (50% or greater), or occlusion. Partial retropharyngeal course. Mild atheromatous plaque at the bifurcation. Left carotid system: No evidence of dissection, stenosis (50% or greater), or occlusion. Partial retropharyngeal course. Mild atheromatous plaque at the bifurcation. Vertebral arteries: No proximal subclavian stenosis. Atheromatous calcification at the vertebral origins with 40-50% narrowing on the left. The vertebral arteries are smoothly contoured at levels not affected by streak artifact. Skeleton: No acute or aggressive finding Other neck: Benign soft tissues Upper chest: Clear apical lungs. Review of the MIP images confirms the above findings CTA HEAD FINDINGS Anterior circulation: Atheromatous calcification of the carotid siphons. No branch occlusion, beading, or aneurysm. Posterior circulation: The vertebral and basilar arteries are smoothly contoured and widely patent. Atheromatous irregularity of the posterior cerebral arteries with moderate P3 segment stenosis on the right. Venous sinuses: Unremarkable Anatomic variants: None significant Review of the MIP images confirms the above findings IMPRESSION: 1. No emergent finding. 2. Fairly mild atherosclerosis in the neck although left vertebral origin stenosis approaches 50%. 3. Intracranial atherosclerosis especially affecting posterior cerebral arteries. Electronically Signed   By: Tiburcio Pea M.D.   On: 02/06/2023 07:09   MR BRAIN WO  CONTRAST  Result Date: 02/06/2023 CLINICAL DATA:  Left-sided numbness EXAM: MRI HEAD WITHOUT CONTRAST TECHNIQUE: Multiplanar, multiecho pulse sequences of the brain and surrounding structures were obtained without intravenous contrast. COMPARISON:  02/01/2016 FINDINGS: Brain: Small acute infarct of the lateral right thalamus. Is in  close proximity to the posterior limb of the internal capsule. No acute or chronic hemorrhage. There is multifocal hyperintense T2-weighted signal within the white matter. Parenchymal volume and CSF spaces are normal. The midline structures are normal. Vascular: Major flow voids are preserved. Skull and upper cervical spine: Normal calvarium and skull base. Visualized upper cervical spine and soft tissues are normal. Sinuses/Orbits:No paranasal sinus fluid levels or advanced mucosal thickening. No mastoid or middle ear effusion. Normal orbits. IMPRESSION: 1. 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 No hemorrhage or mass effect. 2. Findings of chronic small vessel ischemia. Electronically Signed   By: Deatra Robinson M.D.   On: 02/06/2023 03:48   CT Head Wo Contrast  Result Date: 02/05/2023 CLINICAL DATA:  Acute stroke suspected. Left face, arm and leg numbness. EXAM: CT HEAD WITHOUT CONTRAST TECHNIQUE: Contiguous axial images were obtained from the base of the skull through the vertex without intravenous contrast. RADIATION DOSE REDUCTION: This exam was performed according to the departmental dose-optimization program which includes automated exposure control, adjustment of the mA and/or kV according to patient size and/or use of iterative reconstruction technique. COMPARISON:  MRI brain 02/01/2016 FINDINGS: Brain: No evidence of acute infarction, hemorrhage, hydrocephalus, extra-axial collection or mass lesion/mass effect. There is mild diffuse atrophy. Vascular: Atherosclerotic calcifications are present within the cavernous internal carotid arteries. Skull: Normal. Negative for fracture or focal lesion. Sinuses/Orbits: No acute finding. Other: None. IMPRESSION: 1. No acute intracranial abnormality. 2. Mild diffuse atrophy. Electronically Signed   By: Darliss Cheney M.D.   On: 02/05/2023 20:35     PHYSICAL EXAM  Temp:  [97.7 F (36.5  C)-98.6 F (37 C)] 98.2 F (36.8 C) (05/12 0936) Pulse Rate:  [67-83] 78 (05/12 1015) Resp:  [15-25] 19 (05/12 1015) BP: (111-163)/(73-98) 143/81 (05/12 1000) SpO2:  [95 %-100 %] 96 % (05/12 1015) Weight:  [100.2 kg] 100.2 kg (05/11 1927)  General - Well nourished, well developed, in no apparent distress.  Ophthalmologic - fundi not visualized due to noncooperation.  Cardiovascular - Regular rhythm and rate.  Mental Status -  Level of arousal and orientation to time, place, and person were intact. Language including expression, naming, repetition, comprehension was assessed and found intact. Attention span and concentration were normal. Fund of Knowledge was assessed and was intact.  Cranial Nerves II - XII - II - Visual field intact OU. III, IV, VI - Extraocular movements intact. V - Facial sensation intact bilaterally. VII - Facial movement intact bilaterally. VIII - Hearing & vestibular intact bilaterally. X - Palate elevates symmetrically. XI - Chin turning & shoulder shrug intact bilaterally. XII - Tongue protrusion intact.  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  Bulk was normal and fasciculations were absent.   Motor Tone - Muscle tone was assessed at the neck and appendages and was normal.  Reflexes - The patient's reflexes were symmetrical in all extremities and she had no pathological reflexes.  Sensory - Light touch, temperature/pinprick were assessed and were symmetrical except decreased light touch  sensation around left foot.    Coordination - The patient had normal movements in the hands and feet with no ataxia or dysmetria.  Tremor was absent.  Gait and Station - deferred.   ASSESSMENT/PLAN Candice Reid is a 82 y.o. female with history of hypertension, hyperlipidemia, diabetes, OSA not on CPAP,  obesity admitted for left-sided numbness. No tPA given due to outside window.    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 MRI small acute infarct of the right lateral thalamus 2D Echo EF 60 to 65% LDL 96, TG 233 HgbA1c 6.9 Lovenox for VTE prophylaxis No antithrombotic prior to admission, now on aspirin 81 mg daily and clopidogrel 75 mg daily DAPT for 3 months and then aspirin alone given right P3 stenosis Patient counseled to be compliant with her antithrombotic medications Ongoing aggressive stroke risk factor management Therapy recommendations: None Disposition: Home today  Diabetes HgbA1c 6.9 goal < 7.0 Controlled CBG monitoring SSI DM education and close PCP follow up  Hypertension Stable Long term BP goal normotensive  Hyperlipidemia Home meds: Zetia and pravastatin 43 times a week LDL 96, goal < 70 Now on Zetia and Crestor 20 Continue statin at discharge  Other Stroke Risk Factors Advanced age Obesity, Body mass index is 37.35 kg/m.  Obstructive sleep apnea not on CPAP  Other Active Problems BPPV 01/2016  Hospital day # 0  Neurology will sign off. Please call with questions. Pt will follow up with stroke clinic NP at [facility] in about 4 weeks. Thanks for the consult.   Marvel Plan, MD PhD Stroke Neurology 02/06/2023 3:39 PM    To contact Stroke Continuity provider, please refer to [email or web address]. After hours, contact General Neurology

## 2023-02-06 NOTE — ED Provider Notes (Signed)
1:29 AM Patient care assumed from MD Tegeler in transfer from [facility]. Patient sent for MRI brain to r/o CVA. Presented to the ED for recurrent and progressive numbness. MRI imaging is pending at this time. CT head was without acute process. Neurology team was engaged by MD Tegeler prior to transfer.  1:51 AM Patient requesting medications for anxiety for MRI. Ativan ordered.  3:55 AM MRI notes small acute infarct of the lateral R thalamus. No hemorrhage or mass effect. Will consult Neurology. Anticipate hospitalist admission.  4:32 AM Neurology service aware of patient and will see in consultation. Pending call back from [facility].  4:35 AM Case discussed with Dr. Loney Loh who will admit.   .Critical Care  Performed by: Antony Madura, PA-C Authorized by: Antony Madura, PA-C   Critical care provider statement:    Critical care time (minutes):  30   Critical care time was exclusive of:  Separately billable procedures and treating other patients   Critical care was necessary to treat or prevent imminent or life-threatening deterioration of the following conditions:  CNS failure or compromise (Acute CVA)   Critical care was time spent personally by me on the following activities:  Development of treatment plan with patient or surrogate, discussions with consultants, evaluation of patient's response to treatment, examination of patient, ordering and review of laboratory studies, ordering and review of radiographic studies, ordering and performing treatments and interventions, pulse oximetry, re-evaluation of patient's condition, review of old charts and obtaining history from patient or surrogate   Care discussed with: admitting provider     Results for orders placed or performed during the hospital encounter of 02/05/23  CBC with Differential  Result Value Ref Range   WBC 10.3 4.0 - 10.5 K/uL   RBC 4.38 3.87 - 5.11 MIL/uL   Hemoglobin 12.5 12.0 - 15.0 g/dL   HCT 09.8 11.9 - 14.7 %   MCV 88.1 80.0  - 100.0 fL   MCH 28.5 26.0 - 34.0 pg   MCHC 32.4 30.0 - 36.0 g/dL   RDW 82.9 56.2 - 13.0 %   Platelets 249 150 - 400 K/uL   nRBC 0.0 0.0 - 0.2 %   Neutrophils Relative % 58 %   Neutro Abs 5.9 1.7 - 7.7 K/uL   Lymphocytes Relative 32 %   Lymphs Abs 3.2 0.7 - 4.0 K/uL   Monocytes Relative 7 %   Monocytes Absolute 0.7 0.1 - 1.0 K/uL   Eosinophils Relative 2 %   Eosinophils Absolute 0.3 0.0 - 0.5 K/uL   Basophils Relative 0 %   Basophils Absolute 0.0 0.0 - 0.1 K/uL   Immature Granulocytes 1 %   Abs Immature Granulocytes 0.06 0.00 - 0.07 K/uL  Comprehensive metabolic panel  Result Value Ref Range   Sodium 135 135 - 145 mmol/L   Potassium 3.6 3.5 - 5.1 mmol/L   Chloride 98 98 - 111 mmol/L   CO2 25 22 - 32 mmol/L   Glucose, Bld 122 (H) 70 - 99 mg/dL   BUN 18 8 - 23 mg/dL   Creatinine, Ser 8.65 0.44 - 1.00 mg/dL   Calcium 8.8 (L) 8.9 - 10.3 mg/dL   Total Protein 7.3 6.5 - 8.1 g/dL   Albumin 3.7 3.5 - 5.0 g/dL   AST 25 15 - 41 U/L   ALT 18 0 - 44 U/L   Alkaline Phosphatase 71 38 - 126 U/L   Total Bilirubin 0.3 0.3 - 1.2 mg/dL   GFR, Estimated >78 >46 mL/min  Anion gap 12 5 - 15  TSH  Result Value Ref Range   TSH 2.895 0.350 - 4.500 uIU/mL  POC CBG, ED  Result Value Ref Range   Glucose-Capillary 116 (H) 70 - 99 mg/dL   MR BRAIN WO CONTRAST  Result Date: 02/06/2023 CLINICAL DATA:  Left-sided numbness EXAM: MRI HEAD WITHOUT CONTRAST TECHNIQUE: Multiplanar, multiecho pulse sequences of the brain and surrounding structures were obtained without intravenous contrast. COMPARISON:  02/01/2016 FINDINGS: Brain: Small acute infarct of the lateral right thalamus. Is in close proximity to the posterior limb of the internal capsule. No acute or chronic hemorrhage. There is multifocal hyperintense T2-weighted signal within the white matter. Parenchymal volume and CSF spaces are normal. The midline structures are normal. Vascular: Major flow voids are preserved. Skull and upper cervical spine:  Normal calvarium and skull base. Visualized upper cervical spine and soft tissues are normal. Sinuses/Orbits:No paranasal sinus fluid levels or advanced mucosal thickening. No mastoid or middle ear effusion. Normal orbits. IMPRESSION: 1. 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 No hemorrhage or mass effect. 2. Findings of chronic small vessel ischemia. Electronically Signed   By: Deatra Robinson M.D.   On: 02/06/2023 03:48   CT Head Wo Contrast  Result Date: 02/05/2023 CLINICAL DATA:  Acute stroke suspected. Left face, arm and leg numbness. EXAM: CT HEAD WITHOUT CONTRAST TECHNIQUE: Contiguous axial images were obtained from the base of the skull through the vertex without intravenous contrast. RADIATION DOSE REDUCTION: This exam was performed according to the departmental dose-optimization program which includes automated exposure control, adjustment of the mA and/or kV according to patient size and/or use of iterative reconstruction technique. COMPARISON:  MRI brain 02/01/2016 FINDINGS: Brain: No evidence of acute infarction, hemorrhage, hydrocephalus, extra-axial collection or mass lesion/mass effect. There is mild diffuse atrophy. Vascular: Atherosclerotic calcifications are present within the cavernous internal carotid arteries. Skull: Normal. Negative for fracture or focal lesion. Sinuses/Orbits: No acute finding. Other: None. IMPRESSION: 1. No acute intracranial abnormality. 2. Mild diffuse atrophy. Electronically Signed   By: Darliss Cheney M.D.   On: 02/05/2023 20:35      Antony Madura, PA-C 02/06/23 0435    Nira Conn, MD 02/06/23 [phone]

## 2023-02-08 DIAGNOSIS — Z9989 Dependence on other enabling machines and devices: Secondary | ICD-10-CM | POA: Diagnosis not present

## 2023-02-08 DIAGNOSIS — Z09 Encounter for follow-up examination after completed treatment for conditions other than malignant neoplasm: Secondary | ICD-10-CM | POA: Diagnosis not present

## 2023-02-08 DIAGNOSIS — R8290 Unspecified abnormal findings in urine: Secondary | ICD-10-CM | POA: Diagnosis not present

## 2023-02-08 DIAGNOSIS — Z9289 Personal history of other medical treatment: Secondary | ICD-10-CM | POA: Diagnosis not present

## 2023-02-08 DIAGNOSIS — E1142 Type 2 diabetes mellitus with diabetic polyneuropathy: Secondary | ICD-10-CM | POA: Diagnosis not present

## 2023-02-08 DIAGNOSIS — K219 Gastro-esophageal reflux disease without esophagitis: Secondary | ICD-10-CM | POA: Diagnosis not present

## 2023-02-08 DIAGNOSIS — Z79899 Other long term (current) drug therapy: Secondary | ICD-10-CM | POA: Diagnosis not present

## 2023-02-08 DIAGNOSIS — E1151 Type 2 diabetes mellitus with diabetic peripheral angiopathy without gangrene: Secondary | ICD-10-CM | POA: Diagnosis not present

## 2023-02-17 DIAGNOSIS — K5900 Constipation, unspecified: Secondary | ICD-10-CM | POA: Diagnosis not present

## 2023-02-17 DIAGNOSIS — J45991 Cough variant asthma: Secondary | ICD-10-CM | POA: Diagnosis not present

## 2023-02-17 DIAGNOSIS — E1142 Type 2 diabetes mellitus with diabetic polyneuropathy: Secondary | ICD-10-CM | POA: Diagnosis not present

## 2023-02-17 DIAGNOSIS — Z7984 Long term (current) use of oral hypoglycemic drugs: Secondary | ICD-10-CM | POA: Diagnosis not present

## 2023-02-17 DIAGNOSIS — M542 Cervicalgia: Secondary | ICD-10-CM | POA: Diagnosis not present

## 2023-02-17 DIAGNOSIS — I69354 Hemiplegia and hemiparesis following cerebral infarction affecting left non-dominant side: Secondary | ICD-10-CM | POA: Diagnosis not present

## 2023-02-17 DIAGNOSIS — Z87440 Personal history of urinary (tract) infections: Secondary | ICD-10-CM | POA: Diagnosis not present

## 2023-02-17 DIAGNOSIS — M48061 Spinal stenosis, lumbar region without neurogenic claudication: Secondary | ICD-10-CM | POA: Diagnosis not present

## 2023-02-17 DIAGNOSIS — G5603 Carpal tunnel syndrome, bilateral upper limbs: Secondary | ICD-10-CM | POA: Diagnosis not present

## 2023-02-17 DIAGNOSIS — K219 Gastro-esophageal reflux disease without esophagitis: Secondary | ICD-10-CM | POA: Diagnosis not present

## 2023-02-17 DIAGNOSIS — R8290 Unspecified abnormal findings in urine: Secondary | ICD-10-CM | POA: Diagnosis not present

## 2023-02-17 DIAGNOSIS — E1151 Type 2 diabetes mellitus with diabetic peripheral angiopathy without gangrene: Secondary | ICD-10-CM | POA: Diagnosis not present

## 2023-02-17 DIAGNOSIS — E782 Mixed hyperlipidemia: Secondary | ICD-10-CM | POA: Diagnosis not present

## 2023-02-17 DIAGNOSIS — Z853 Personal history of malignant neoplasm of breast: Secondary | ICD-10-CM | POA: Diagnosis not present

## 2023-02-17 DIAGNOSIS — M19042 Primary osteoarthritis, left hand: Secondary | ICD-10-CM | POA: Diagnosis not present

## 2023-02-17 DIAGNOSIS — Z87442 Personal history of urinary calculi: Secondary | ICD-10-CM | POA: Diagnosis not present

## 2023-02-17 DIAGNOSIS — Z7902 Long term (current) use of antithrombotics/antiplatelets: Secondary | ICD-10-CM | POA: Diagnosis not present

## 2023-02-17 DIAGNOSIS — G720 Drug-induced myopathy: Secondary | ICD-10-CM | POA: Diagnosis not present

## 2023-02-17 DIAGNOSIS — I10 Essential (primary) hypertension: Secondary | ICD-10-CM | POA: Diagnosis not present

## 2023-02-17 DIAGNOSIS — G4733 Obstructive sleep apnea (adult) (pediatric): Secondary | ICD-10-CM | POA: Diagnosis not present

## 2023-02-17 DIAGNOSIS — K760 Fatty (change of) liver, not elsewhere classified: Secondary | ICD-10-CM | POA: Diagnosis not present

## 2023-02-23 DIAGNOSIS — M542 Cervicalgia: Secondary | ICD-10-CM | POA: Diagnosis not present

## 2023-02-23 DIAGNOSIS — M48061 Spinal stenosis, lumbar region without neurogenic claudication: Secondary | ICD-10-CM | POA: Diagnosis not present

## 2023-02-23 DIAGNOSIS — Z87442 Personal history of urinary calculi: Secondary | ICD-10-CM | POA: Diagnosis not present

## 2023-02-23 DIAGNOSIS — G4733 Obstructive sleep apnea (adult) (pediatric): Secondary | ICD-10-CM | POA: Diagnosis not present

## 2023-02-23 DIAGNOSIS — E1142 Type 2 diabetes mellitus with diabetic polyneuropathy: Secondary | ICD-10-CM | POA: Diagnosis not present

## 2023-02-23 DIAGNOSIS — Z7902 Long term (current) use of antithrombotics/antiplatelets: Secondary | ICD-10-CM | POA: Diagnosis not present

## 2023-02-23 DIAGNOSIS — R8290 Unspecified abnormal findings in urine: Secondary | ICD-10-CM | POA: Diagnosis not present

## 2023-02-23 DIAGNOSIS — I10 Essential (primary) hypertension: Secondary | ICD-10-CM | POA: Diagnosis not present

## 2023-02-23 DIAGNOSIS — Z87440 Personal history of urinary (tract) infections: Secondary | ICD-10-CM | POA: Diagnosis not present

## 2023-02-23 DIAGNOSIS — J45991 Cough variant asthma: Secondary | ICD-10-CM | POA: Diagnosis not present

## 2023-02-23 DIAGNOSIS — Z853 Personal history of malignant neoplasm of breast: Secondary | ICD-10-CM | POA: Diagnosis not present

## 2023-02-23 DIAGNOSIS — E782 Mixed hyperlipidemia: Secondary | ICD-10-CM | POA: Diagnosis not present

## 2023-02-23 DIAGNOSIS — G5603 Carpal tunnel syndrome, bilateral upper limbs: Secondary | ICD-10-CM | POA: Diagnosis not present

## 2023-02-23 DIAGNOSIS — I69354 Hemiplegia and hemiparesis following cerebral infarction affecting left non-dominant side: Secondary | ICD-10-CM | POA: Diagnosis not present

## 2023-02-23 DIAGNOSIS — Z7984 Long term (current) use of oral hypoglycemic drugs: Secondary | ICD-10-CM | POA: Diagnosis not present

## 2023-02-23 DIAGNOSIS — E1151 Type 2 diabetes mellitus with diabetic peripheral angiopathy without gangrene: Secondary | ICD-10-CM | POA: Diagnosis not present

## 2023-02-23 DIAGNOSIS — K5900 Constipation, unspecified: Secondary | ICD-10-CM | POA: Diagnosis not present

## 2023-02-23 DIAGNOSIS — G720 Drug-induced myopathy: Secondary | ICD-10-CM | POA: Diagnosis not present

## 2023-02-23 DIAGNOSIS — K760 Fatty (change of) liver, not elsewhere classified: Secondary | ICD-10-CM | POA: Diagnosis not present

## 2023-02-23 DIAGNOSIS — K219 Gastro-esophageal reflux disease without esophagitis: Secondary | ICD-10-CM | POA: Diagnosis not present

## 2023-02-23 DIAGNOSIS — M19042 Primary osteoarthritis, left hand: Secondary | ICD-10-CM | POA: Diagnosis not present

## 2023-02-25 DIAGNOSIS — Z7902 Long term (current) use of antithrombotics/antiplatelets: Secondary | ICD-10-CM | POA: Diagnosis not present

## 2023-02-25 DIAGNOSIS — Z853 Personal history of malignant neoplasm of breast: Secondary | ICD-10-CM | POA: Diagnosis not present

## 2023-02-25 DIAGNOSIS — E1151 Type 2 diabetes mellitus with diabetic peripheral angiopathy without gangrene: Secondary | ICD-10-CM | POA: Diagnosis not present

## 2023-02-25 DIAGNOSIS — I69354 Hemiplegia and hemiparesis following cerebral infarction affecting left non-dominant side: Secondary | ICD-10-CM | POA: Diagnosis not present

## 2023-02-25 DIAGNOSIS — M542 Cervicalgia: Secondary | ICD-10-CM | POA: Diagnosis not present

## 2023-02-25 DIAGNOSIS — E1142 Type 2 diabetes mellitus with diabetic polyneuropathy: Secondary | ICD-10-CM | POA: Diagnosis not present

## 2023-02-25 DIAGNOSIS — M48061 Spinal stenosis, lumbar region without neurogenic claudication: Secondary | ICD-10-CM | POA: Diagnosis not present

## 2023-02-25 DIAGNOSIS — I10 Essential (primary) hypertension: Secondary | ICD-10-CM | POA: Diagnosis not present

## 2023-02-25 DIAGNOSIS — Z87442 Personal history of urinary calculi: Secondary | ICD-10-CM | POA: Diagnosis not present

## 2023-02-25 DIAGNOSIS — G4733 Obstructive sleep apnea (adult) (pediatric): Secondary | ICD-10-CM | POA: Diagnosis not present

## 2023-02-25 DIAGNOSIS — Z87440 Personal history of urinary (tract) infections: Secondary | ICD-10-CM | POA: Diagnosis not present

## 2023-02-25 DIAGNOSIS — E782 Mixed hyperlipidemia: Secondary | ICD-10-CM | POA: Diagnosis not present

## 2023-02-25 DIAGNOSIS — M19042 Primary osteoarthritis, left hand: Secondary | ICD-10-CM | POA: Diagnosis not present

## 2023-02-25 DIAGNOSIS — K5900 Constipation, unspecified: Secondary | ICD-10-CM | POA: Diagnosis not present

## 2023-02-25 DIAGNOSIS — G5603 Carpal tunnel syndrome, bilateral upper limbs: Secondary | ICD-10-CM | POA: Diagnosis not present

## 2023-02-25 DIAGNOSIS — K760 Fatty (change of) liver, not elsewhere classified: Secondary | ICD-10-CM | POA: Diagnosis not present

## 2023-02-25 DIAGNOSIS — G720 Drug-induced myopathy: Secondary | ICD-10-CM | POA: Diagnosis not present

## 2023-02-25 DIAGNOSIS — J45991 Cough variant asthma: Secondary | ICD-10-CM | POA: Diagnosis not present

## 2023-02-25 DIAGNOSIS — K219 Gastro-esophageal reflux disease without esophagitis: Secondary | ICD-10-CM | POA: Diagnosis not present

## 2023-02-25 DIAGNOSIS — Z7984 Long term (current) use of oral hypoglycemic drugs: Secondary | ICD-10-CM | POA: Diagnosis not present

## 2023-02-25 DIAGNOSIS — R8290 Unspecified abnormal findings in urine: Secondary | ICD-10-CM | POA: Diagnosis not present

## 2023-03-01 DIAGNOSIS — E1142 Type 2 diabetes mellitus with diabetic polyneuropathy: Secondary | ICD-10-CM | POA: Diagnosis not present

## 2023-03-01 DIAGNOSIS — R8290 Unspecified abnormal findings in urine: Secondary | ICD-10-CM | POA: Diagnosis not present

## 2023-03-01 DIAGNOSIS — I10 Essential (primary) hypertension: Secondary | ICD-10-CM | POA: Diagnosis not present

## 2023-03-01 DIAGNOSIS — G4733 Obstructive sleep apnea (adult) (pediatric): Secondary | ICD-10-CM | POA: Diagnosis not present

## 2023-03-01 DIAGNOSIS — J45991 Cough variant asthma: Secondary | ICD-10-CM | POA: Diagnosis not present

## 2023-03-01 DIAGNOSIS — Z87442 Personal history of urinary calculi: Secondary | ICD-10-CM | POA: Diagnosis not present

## 2023-03-01 DIAGNOSIS — K760 Fatty (change of) liver, not elsewhere classified: Secondary | ICD-10-CM | POA: Diagnosis not present

## 2023-03-01 DIAGNOSIS — Z853 Personal history of malignant neoplasm of breast: Secondary | ICD-10-CM | POA: Diagnosis not present

## 2023-03-01 DIAGNOSIS — G720 Drug-induced myopathy: Secondary | ICD-10-CM | POA: Diagnosis not present

## 2023-03-01 DIAGNOSIS — K219 Gastro-esophageal reflux disease without esophagitis: Secondary | ICD-10-CM | POA: Diagnosis not present

## 2023-03-01 DIAGNOSIS — Z7902 Long term (current) use of antithrombotics/antiplatelets: Secondary | ICD-10-CM | POA: Diagnosis not present

## 2023-03-01 DIAGNOSIS — M542 Cervicalgia: Secondary | ICD-10-CM | POA: Diagnosis not present

## 2023-03-01 DIAGNOSIS — Z87440 Personal history of urinary (tract) infections: Secondary | ICD-10-CM | POA: Diagnosis not present

## 2023-03-01 DIAGNOSIS — Z7984 Long term (current) use of oral hypoglycemic drugs: Secondary | ICD-10-CM | POA: Diagnosis not present

## 2023-03-01 DIAGNOSIS — K5900 Constipation, unspecified: Secondary | ICD-10-CM | POA: Diagnosis not present

## 2023-03-01 DIAGNOSIS — G5603 Carpal tunnel syndrome, bilateral upper limbs: Secondary | ICD-10-CM | POA: Diagnosis not present

## 2023-03-01 DIAGNOSIS — M48061 Spinal stenosis, lumbar region without neurogenic claudication: Secondary | ICD-10-CM | POA: Diagnosis not present

## 2023-03-01 DIAGNOSIS — I69354 Hemiplegia and hemiparesis following cerebral infarction affecting left non-dominant side: Secondary | ICD-10-CM | POA: Diagnosis not present

## 2023-03-01 DIAGNOSIS — E1151 Type 2 diabetes mellitus with diabetic peripheral angiopathy without gangrene: Secondary | ICD-10-CM | POA: Diagnosis not present

## 2023-03-01 DIAGNOSIS — M19042 Primary osteoarthritis, left hand: Secondary | ICD-10-CM | POA: Diagnosis not present

## 2023-03-01 DIAGNOSIS — E782 Mixed hyperlipidemia: Secondary | ICD-10-CM | POA: Diagnosis not present

## 2023-03-10 DIAGNOSIS — Z7984 Long term (current) use of oral hypoglycemic drugs: Secondary | ICD-10-CM | POA: Diagnosis not present

## 2023-03-10 DIAGNOSIS — K760 Fatty (change of) liver, not elsewhere classified: Secondary | ICD-10-CM | POA: Diagnosis not present

## 2023-03-10 DIAGNOSIS — Z87442 Personal history of urinary calculi: Secondary | ICD-10-CM | POA: Diagnosis not present

## 2023-03-10 DIAGNOSIS — Z87440 Personal history of urinary (tract) infections: Secondary | ICD-10-CM | POA: Diagnosis not present

## 2023-03-10 DIAGNOSIS — R8290 Unspecified abnormal findings in urine: Secondary | ICD-10-CM | POA: Diagnosis not present

## 2023-03-10 DIAGNOSIS — J45991 Cough variant asthma: Secondary | ICD-10-CM | POA: Diagnosis not present

## 2023-03-10 DIAGNOSIS — E782 Mixed hyperlipidemia: Secondary | ICD-10-CM | POA: Diagnosis not present

## 2023-03-10 DIAGNOSIS — G720 Drug-induced myopathy: Secondary | ICD-10-CM | POA: Diagnosis not present

## 2023-03-10 DIAGNOSIS — K5900 Constipation, unspecified: Secondary | ICD-10-CM | POA: Diagnosis not present

## 2023-03-10 DIAGNOSIS — G5603 Carpal tunnel syndrome, bilateral upper limbs: Secondary | ICD-10-CM | POA: Diagnosis not present

## 2023-03-10 DIAGNOSIS — E1142 Type 2 diabetes mellitus with diabetic polyneuropathy: Secondary | ICD-10-CM | POA: Diagnosis not present

## 2023-03-10 DIAGNOSIS — M48061 Spinal stenosis, lumbar region without neurogenic claudication: Secondary | ICD-10-CM | POA: Diagnosis not present

## 2023-03-10 DIAGNOSIS — M542 Cervicalgia: Secondary | ICD-10-CM | POA: Diagnosis not present

## 2023-03-10 DIAGNOSIS — K219 Gastro-esophageal reflux disease without esophagitis: Secondary | ICD-10-CM | POA: Diagnosis not present

## 2023-03-10 DIAGNOSIS — M19042 Primary osteoarthritis, left hand: Secondary | ICD-10-CM | POA: Diagnosis not present

## 2023-03-10 DIAGNOSIS — Z853 Personal history of malignant neoplasm of breast: Secondary | ICD-10-CM | POA: Diagnosis not present

## 2023-03-10 DIAGNOSIS — E1151 Type 2 diabetes mellitus with diabetic peripheral angiopathy without gangrene: Secondary | ICD-10-CM | POA: Diagnosis not present

## 2023-03-10 DIAGNOSIS — G4733 Obstructive sleep apnea (adult) (pediatric): Secondary | ICD-10-CM | POA: Diagnosis not present

## 2023-03-10 DIAGNOSIS — I69354 Hemiplegia and hemiparesis following cerebral infarction affecting left non-dominant side: Secondary | ICD-10-CM | POA: Diagnosis not present

## 2023-03-10 DIAGNOSIS — Z7902 Long term (current) use of antithrombotics/antiplatelets: Secondary | ICD-10-CM | POA: Diagnosis not present

## 2023-03-10 DIAGNOSIS — I10 Essential (primary) hypertension: Secondary | ICD-10-CM | POA: Diagnosis not present

## 2023-03-15 DIAGNOSIS — H6121 Impacted cerumen, right ear: Secondary | ICD-10-CM | POA: Diagnosis not present

## 2023-03-15 DIAGNOSIS — E782 Mixed hyperlipidemia: Secondary | ICD-10-CM | POA: Diagnosis not present

## 2023-03-15 DIAGNOSIS — Z8673 Personal history of transient ischemic attack (TIA), and cerebral infarction without residual deficits: Secondary | ICD-10-CM | POA: Diagnosis not present

## 2023-03-15 DIAGNOSIS — I10 Essential (primary) hypertension: Secondary | ICD-10-CM | POA: Diagnosis not present

## 2023-03-16 DIAGNOSIS — K760 Fatty (change of) liver, not elsewhere classified: Secondary | ICD-10-CM | POA: Diagnosis not present

## 2023-03-16 DIAGNOSIS — I10 Essential (primary) hypertension: Secondary | ICD-10-CM | POA: Diagnosis not present

## 2023-03-16 DIAGNOSIS — E1151 Type 2 diabetes mellitus with diabetic peripheral angiopathy without gangrene: Secondary | ICD-10-CM | POA: Diagnosis not present

## 2023-03-16 DIAGNOSIS — M19042 Primary osteoarthritis, left hand: Secondary | ICD-10-CM | POA: Diagnosis not present

## 2023-03-16 DIAGNOSIS — I69354 Hemiplegia and hemiparesis following cerebral infarction affecting left non-dominant side: Secondary | ICD-10-CM | POA: Diagnosis not present

## 2023-03-16 DIAGNOSIS — M48061 Spinal stenosis, lumbar region without neurogenic claudication: Secondary | ICD-10-CM | POA: Diagnosis not present

## 2023-03-16 DIAGNOSIS — K219 Gastro-esophageal reflux disease without esophagitis: Secondary | ICD-10-CM | POA: Diagnosis not present

## 2023-03-16 DIAGNOSIS — Z87442 Personal history of urinary calculi: Secondary | ICD-10-CM | POA: Diagnosis not present

## 2023-03-16 DIAGNOSIS — G720 Drug-induced myopathy: Secondary | ICD-10-CM | POA: Diagnosis not present

## 2023-03-16 DIAGNOSIS — G4733 Obstructive sleep apnea (adult) (pediatric): Secondary | ICD-10-CM | POA: Diagnosis not present

## 2023-03-16 DIAGNOSIS — Z87440 Personal history of urinary (tract) infections: Secondary | ICD-10-CM | POA: Diagnosis not present

## 2023-03-16 DIAGNOSIS — E1142 Type 2 diabetes mellitus with diabetic polyneuropathy: Secondary | ICD-10-CM | POA: Diagnosis not present

## 2023-03-16 DIAGNOSIS — Z7902 Long term (current) use of antithrombotics/antiplatelets: Secondary | ICD-10-CM | POA: Diagnosis not present

## 2023-03-16 DIAGNOSIS — R8290 Unspecified abnormal findings in urine: Secondary | ICD-10-CM | POA: Diagnosis not present

## 2023-03-16 DIAGNOSIS — Z853 Personal history of malignant neoplasm of breast: Secondary | ICD-10-CM | POA: Diagnosis not present

## 2023-03-16 DIAGNOSIS — K5900 Constipation, unspecified: Secondary | ICD-10-CM | POA: Diagnosis not present

## 2023-03-16 DIAGNOSIS — M542 Cervicalgia: Secondary | ICD-10-CM | POA: Diagnosis not present

## 2023-03-16 DIAGNOSIS — E782 Mixed hyperlipidemia: Secondary | ICD-10-CM | POA: Diagnosis not present

## 2023-03-16 DIAGNOSIS — J45991 Cough variant asthma: Secondary | ICD-10-CM | POA: Diagnosis not present

## 2023-03-16 DIAGNOSIS — Z7984 Long term (current) use of oral hypoglycemic drugs: Secondary | ICD-10-CM | POA: Diagnosis not present

## 2023-03-16 DIAGNOSIS — G5603 Carpal tunnel syndrome, bilateral upper limbs: Secondary | ICD-10-CM | POA: Diagnosis not present

## 2023-04-01 DIAGNOSIS — J45991 Cough variant asthma: Secondary | ICD-10-CM | POA: Diagnosis not present

## 2023-04-01 DIAGNOSIS — K219 Gastro-esophageal reflux disease without esophagitis: Secondary | ICD-10-CM | POA: Diagnosis not present

## 2023-04-01 DIAGNOSIS — G720 Drug-induced myopathy: Secondary | ICD-10-CM | POA: Diagnosis not present

## 2023-04-01 DIAGNOSIS — Z853 Personal history of malignant neoplasm of breast: Secondary | ICD-10-CM | POA: Diagnosis not present

## 2023-04-01 DIAGNOSIS — E1151 Type 2 diabetes mellitus with diabetic peripheral angiopathy without gangrene: Secondary | ICD-10-CM | POA: Diagnosis not present

## 2023-04-01 DIAGNOSIS — Z87442 Personal history of urinary calculi: Secondary | ICD-10-CM | POA: Diagnosis not present

## 2023-04-01 DIAGNOSIS — I69354 Hemiplegia and hemiparesis following cerebral infarction affecting left non-dominant side: Secondary | ICD-10-CM | POA: Diagnosis not present

## 2023-04-01 DIAGNOSIS — E782 Mixed hyperlipidemia: Secondary | ICD-10-CM | POA: Diagnosis not present

## 2023-04-01 DIAGNOSIS — Z7984 Long term (current) use of oral hypoglycemic drugs: Secondary | ICD-10-CM | POA: Diagnosis not present

## 2023-04-01 DIAGNOSIS — I10 Essential (primary) hypertension: Secondary | ICD-10-CM | POA: Diagnosis not present

## 2023-04-01 DIAGNOSIS — K5900 Constipation, unspecified: Secondary | ICD-10-CM | POA: Diagnosis not present

## 2023-04-01 DIAGNOSIS — M542 Cervicalgia: Secondary | ICD-10-CM | POA: Diagnosis not present

## 2023-04-01 DIAGNOSIS — K760 Fatty (change of) liver, not elsewhere classified: Secondary | ICD-10-CM | POA: Diagnosis not present

## 2023-04-01 DIAGNOSIS — G4733 Obstructive sleep apnea (adult) (pediatric): Secondary | ICD-10-CM | POA: Diagnosis not present

## 2023-04-01 DIAGNOSIS — M19042 Primary osteoarthritis, left hand: Secondary | ICD-10-CM | POA: Diagnosis not present

## 2023-04-01 DIAGNOSIS — Z7902 Long term (current) use of antithrombotics/antiplatelets: Secondary | ICD-10-CM | POA: Diagnosis not present

## 2023-04-01 DIAGNOSIS — R8290 Unspecified abnormal findings in urine: Secondary | ICD-10-CM | POA: Diagnosis not present

## 2023-04-01 DIAGNOSIS — Z87440 Personal history of urinary (tract) infections: Secondary | ICD-10-CM | POA: Diagnosis not present

## 2023-04-01 DIAGNOSIS — M48061 Spinal stenosis, lumbar region without neurogenic claudication: Secondary | ICD-10-CM | POA: Diagnosis not present

## 2023-04-01 DIAGNOSIS — E1142 Type 2 diabetes mellitus with diabetic polyneuropathy: Secondary | ICD-10-CM | POA: Diagnosis not present

## 2023-04-01 DIAGNOSIS — G5603 Carpal tunnel syndrome, bilateral upper limbs: Secondary | ICD-10-CM | POA: Diagnosis not present

## 2023-04-13 DIAGNOSIS — K219 Gastro-esophageal reflux disease without esophagitis: Secondary | ICD-10-CM | POA: Diagnosis not present

## 2023-04-13 DIAGNOSIS — M48061 Spinal stenosis, lumbar region without neurogenic claudication: Secondary | ICD-10-CM | POA: Diagnosis not present

## 2023-04-13 DIAGNOSIS — R8290 Unspecified abnormal findings in urine: Secondary | ICD-10-CM | POA: Diagnosis not present

## 2023-04-13 DIAGNOSIS — K760 Fatty (change of) liver, not elsewhere classified: Secondary | ICD-10-CM | POA: Diagnosis not present

## 2023-04-13 DIAGNOSIS — Z7902 Long term (current) use of antithrombotics/antiplatelets: Secondary | ICD-10-CM | POA: Diagnosis not present

## 2023-04-13 DIAGNOSIS — E1142 Type 2 diabetes mellitus with diabetic polyneuropathy: Secondary | ICD-10-CM | POA: Diagnosis not present

## 2023-04-13 DIAGNOSIS — M542 Cervicalgia: Secondary | ICD-10-CM | POA: Diagnosis not present

## 2023-04-13 DIAGNOSIS — Z87440 Personal history of urinary (tract) infections: Secondary | ICD-10-CM | POA: Diagnosis not present

## 2023-04-13 DIAGNOSIS — Z7984 Long term (current) use of oral hypoglycemic drugs: Secondary | ICD-10-CM | POA: Diagnosis not present

## 2023-04-13 DIAGNOSIS — I10 Essential (primary) hypertension: Secondary | ICD-10-CM | POA: Diagnosis not present

## 2023-04-13 DIAGNOSIS — K5900 Constipation, unspecified: Secondary | ICD-10-CM | POA: Diagnosis not present

## 2023-04-13 DIAGNOSIS — G720 Drug-induced myopathy: Secondary | ICD-10-CM | POA: Diagnosis not present

## 2023-04-13 DIAGNOSIS — Z853 Personal history of malignant neoplasm of breast: Secondary | ICD-10-CM | POA: Diagnosis not present

## 2023-04-13 DIAGNOSIS — Z87442 Personal history of urinary calculi: Secondary | ICD-10-CM | POA: Diagnosis not present

## 2023-04-13 DIAGNOSIS — M19042 Primary osteoarthritis, left hand: Secondary | ICD-10-CM | POA: Diagnosis not present

## 2023-04-13 DIAGNOSIS — G5603 Carpal tunnel syndrome, bilateral upper limbs: Secondary | ICD-10-CM | POA: Diagnosis not present

## 2023-04-13 DIAGNOSIS — I69354 Hemiplegia and hemiparesis following cerebral infarction affecting left non-dominant side: Secondary | ICD-10-CM | POA: Diagnosis not present

## 2023-04-13 DIAGNOSIS — E1151 Type 2 diabetes mellitus with diabetic peripheral angiopathy without gangrene: Secondary | ICD-10-CM | POA: Diagnosis not present

## 2023-04-13 DIAGNOSIS — J45991 Cough variant asthma: Secondary | ICD-10-CM | POA: Diagnosis not present

## 2023-04-13 DIAGNOSIS — E782 Mixed hyperlipidemia: Secondary | ICD-10-CM | POA: Diagnosis not present

## 2023-04-13 DIAGNOSIS — G4733 Obstructive sleep apnea (adult) (pediatric): Secondary | ICD-10-CM | POA: Diagnosis not present

## 2023-04-14 ENCOUNTER — Ambulatory Visit: Payer: Medicare Other | Admitting: Neurology

## 2023-04-14 ENCOUNTER — Encounter: Payer: Self-pay | Admitting: Neurology

## 2023-04-14 VITALS — BP 149/68 | HR 78 | Ht 64.0 in | Wt 227.2 lb

## 2023-04-14 DIAGNOSIS — E785 Hyperlipidemia, unspecified: Secondary | ICD-10-CM

## 2023-04-14 DIAGNOSIS — I10 Essential (primary) hypertension: Secondary | ICD-10-CM | POA: Diagnosis not present

## 2023-04-14 DIAGNOSIS — G4733 Obstructive sleep apnea (adult) (pediatric): Secondary | ICD-10-CM

## 2023-04-14 DIAGNOSIS — I639 Cerebral infarction, unspecified: Secondary | ICD-10-CM | POA: Diagnosis not present

## 2023-04-14 DIAGNOSIS — E1165 Type 2 diabetes mellitus with hyperglycemia: Secondary | ICD-10-CM

## 2023-04-14 NOTE — Patient Instructions (Addendum)
Aspirin 81 mg daily and clopidogrel 75 mg daily DAPT for 3 months and then aspirin alone given right P3 stenosis (Stop Plavix in mid August 2024)  Strict management of vascular risk factors with a goal BP less than 130/90, A1c less than 7.0, LDL less than 70 for secondary stroke prevention  Revisit CPAP with your ordering provider, as untreated OSA is a risk factor for stroke   Keep close follow up with your primary care doctor   Return here as needed

## 2023-04-14 NOTE — Progress Notes (Signed)
Patient: Candice Reid Date of Birth: 16-Aug-1941  Reason for Visit: Stroke clinic follow-up History from: Patient Primary Neurologist: Pearlean Brownie   ASSESSMENT AND PLAN 82 y.o. year old female with right small thalamic infarct.  Likely due to small vessel disease.  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  Vascular risk factors of DM,  HTN, HLD, OSA not on CPAP.  Residual mild numbness to left 3-5 toes, tendency to drag the left foot occasionally.   -Recommend DAPT aspirin 81 mg daily and Plavix 75 mg daily x 3 months then aspirin 81 mg daily alone given right P3 stenosis (around mid August) -Recommended restarting CPAP with ordering provider given recent stroke event, untreated OSA is stroke risk factor -Strict management of vascular risk factors with a goal BP less than 130/90, A1c less than 7.0, LDL less than 70 for secondary stroke prevention -Continue close follow-up with PCP for management of vascular risk factors, return here on an as-needed basis  HISTORY OF PRESENT ILLNESS: Today 04/14/23 Candice Reid is here for stroke clinic follow-up.  Presented to the ER 02/06/2023 for left-sided numbness to face, arm, leg (clarifies was not weakness).  Found to have right small thalamic infarct.  Likely secondary to small vessel disease source from right P3 stenosis.  3 months of DAPT aspirin 81 and Plavix 75, then aspirin alone given right P3 stenosis.  Zetia and Crestor 20 were added.  History of OSA not on CPAP. She stopped aspirin after d/c, just taking Plavix 75 mg daily. Taking Zetia with Crestor. BP 149/68, up some today, metoprolol, BP runs < 130 at home with PT. Completed PT yesterday, her left foot has tendency to drag, 3-5 toe may cramp, her left foot is numb. Using walker more consistently, was using pre-stroke for knees. Lives alone, drives a car, manages her own affairs.  Doesn't use CPAP due to nightmares.  Feels doing well except for occasionally dragging the left foot, numbness to left 3-5  toes.   -CT head no acute abnormality -CTA head and neck moderate P3 segment stenosis on the right, mild atherosclerosis in the neck with left VA origin stenosis about 50% -MRI of the brain small acute infarct of the right lateral thalamus -2D echo EF 60 to 65% -LDL 96, triglyceride 233 -R6E 6.9 -No antithrombotic prior to admission, 3 months of DAPT aspirin 81 and Plavix 75 daily then aspirin alone given right P3 stenosis  HISTORY  02/06/23 Dr. Derry Lory: Candice Reid is a 82 y.o. female with PMH significant for GERD, hypertension, hyperlipidemia, diabetes, anxiety, depression, OSA not on CPAP who presents with left-sided weakness and numbness.   She was found to have a right thalamic stroke.   She reports that she woke up at 7 AM in the morning and was at her baseline.  She noticed around 9 AM in the morning, her left leg was little clumsy and felt little numb.  She did not think too much about it but then it spread all over to her arm on the left and then into the left lower face.  She discussed this with her family and then they later came to the hospital.     LKW: 0900 on 02/05/2023. mRS: 1 tNKASE: Not offered is too mild to treat and outside the window. Thrombectomy: Not offered as low suspicion for LVO and too mild to treat  REVIEW OF SYSTEMS: Out of a complete 14 system review of symptoms, the patient complains only of the following symptoms, and all  other reviewed systems are negative.  See HPI  ALLERGIES: Allergies  Allergen Reactions   Ciprofloxacin     Unknown reaction   Oxycodone-Acetaminophen Nausea Only   Tyloxapol Nausea And Vomiting   Ace Inhibitors Cough   Metronidazole Nausea Only   Morphine And Codeine Itching   Septra [Sulfamethoxazole-Trimethoprim] Nausea Only   Sulfa Antibiotics Nausea Only   Valium [Diazepam] Other (See Comments)    "Brain fog"    HOME MEDICATIONS: Outpatient Medications Prior to Visit  Medication Sig Dispense Refill    acetaminophen (TYLENOL) 650 MG CR tablet Take 1,300 mg by mouth daily.     ALPRAZolam (XANAX) 0.5 MG tablet Take 0.25 mg by mouth 2 (two) times daily as needed for anxiety.     cholecalciferol (VITAMIN D) 1000 units tablet Take 1,000 Units by mouth daily.     clopidogrel (PLAVIX) 75 MG tablet Take 1 tablet (75 mg total) by mouth daily. 90 tablet 0   DULoxetine (CYMBALTA) 60 MG capsule Take 60 mg by mouth daily.     ezetimibe (ZETIA) 10 MG tablet Take 10 mg by mouth daily.     lansoprazole (PREVACID) 30 MG capsule Take 30 mg by mouth daily.     LIDOCAINE-MENTHOL ROLL-ON EX Apply 1 application. topically daily as needed (shoulder pain).     metoprolol (TOPROL-XL) 100 MG 24 hr tablet Take 100 mg by mouth daily.     olmesartan (BENICAR) 20 MG tablet Take 1 tablet (20 mg total) by mouth daily.     OMEGA-3 1000 MG CAPS Take 1,000 mg by mouth daily.     rosuvastatin (CRESTOR) 20 MG tablet Take 1 tablet (20 mg total) by mouth daily. 90 tablet 1   sitaGLIPtin (JANUVIA) 100 MG tablet Take 100 mg by mouth daily.     tiZANidine (ZANAFLEX) 2 MG tablet Take 1 tablet (2 mg total) by mouth every 6 (six) hours as needed. (Patient taking differently: Take 4 mg by mouth every 8 (eight) hours as needed for muscle spasms.) 60 tablet 0   No facility-administered medications prior to visit.    PAST MEDICAL HISTORY: Past Medical History:  Diagnosis Date   Anemia    Anxiety  Arthritis    Asthma    Breast cancer (HCC) 2015   Complication of anesthesia    shaky-hard to wake up, blood pressure dropped   Dental bridge present    Depression    Diabetes (HCC)    Fatty liver    GERD (gastroesophageal reflux disease)    Hemorrhoids    History of kidney stones     Hot flashes    HTN (hypertension)    Hyperlipemia    Hypertension    Kidney stones    Neuropathy    Obesity    Obstructive sleep apnea    Osteoarthritis bilteral knees   Personal history of radiation therapy 2015   Phobia    Plantar fasciitis    Radiation 05/14/2014   44 Gy Left breast   Wears glasses    reading    PAST SURGICAL HISTORY: Past Surgical History:  Procedure Laterality Date   BREAST LUMPECTOMY  1985   left-neg   BREAST LUMPECTOMY Left 03/11/2014   left   COLONOSCOPY  03/11/2011   diverticulosis, internal hemorrhoids   CYSTOSCOPY W/ URETERAL STENT PLACEMENT     removed   DILATION AND CURETTAGE OF UTERUS     IR ABLATE LIVER CRYOABLATION  04/17/2020   IR RADIOLOGIST EVAL & MGMT  04/03/2020   IR RADIOLOGIST EVAL & MGMT  05/22/2020   KNEE SURGERY Left    x 2-left   OOPHORECTOMY Bilateral    TOTAL KNEE ARTHROPLASTY Right 10/17/2020   Procedure: TOTAL KNEE ARTHROPLASTY;  Surgeon: Jodi Geralds, MD;  Location: [facility];  Service: Orthopedics;  Laterality: Right;   TOTAL KNEE ARTHROPLASTY Left 01/29/2022   Procedure: TOTAL KNEE ARTHROPLASTY;  Surgeon: Jodi Geralds, MD;  Location: [facility];  Service: Orthopedics;  Laterality: Left;   VAGINAL HYSTERECTOMY  1988   endometriosis, fibroids    FAMILY HISTORY: Family History  Problem Relation Age of Onset   Diabetes Mother    Colon polyps Sister    Lung cancer Sister    Ovarian cancer Sister    Brain cancer Sister    Prostate cancer Father    Brain cancer Brother    Colon polyps Maternal Aunt    Renal Disease Sister    Arthritis Other        Neice   Colon cancer Neg Hx     SOCIAL HISTORY: Social History   Socioeconomic History   Marital status: Widowed    Spouse name: Not on file   Number of children: 2   Years of education: Not on file   Highest education level: Not on file  Occupational History   Occupation: Retired   Tobacco Use   Smoking status: Never   Smokeless tobacco: Never  Vaping Use   Vaping  status: Never Used  Substance and Sexual Activity   Alcohol use: No   Drug use: No   Sexual activity: Not Currently    Birth control/protection: Surgical    Comment: Hysterectomy  Other Topics Concern   Not on file  Social History Narrative   1-2 caffeine drinks daily    Social Determinants of Health   Financial Resource Strain: Not on file  Food Insecurity: No Food Insecurity (02/06/2023)   Hunger Vital Sign    Worried About Running Out of Food in the Last Year: Never true    Ran Out of Food in the Last Year: Never true  Transportation Needs: No Transportation Needs (02/06/2023)   PRAPARE - Administrator, Civil Service (Medical):  No    Lack of Transportation (Non-Medical): No  Physical Activity: Not on file  Stress: Not on file  Social Connections: Unknown (02/09/2022)   Received from [facility]   Social Network    Social Network: Not on file  Intimate Partner Violence: Not At Risk (02/06/2023)   Humiliation, Afraid, Rape, and Kick questionnaire    Fear of Current or Ex-Partner: No    Emotionally Abused: No    Physically Abused: No    Sexually Abused: No   PHYSICAL EXAM  Vitals:   04/14/23 1029  BP: (!) 149/68  Pulse: 78  Weight: 227 lb 3.2 oz (103.1 kg)  Height: 5\' 4"  (1.626 m)   Body mass index is 39 kg/m.  Generalized: Well developed, in no acute distress  Neurological examination  Mentation: Alert oriented to time, place, history taking. Follows all commands speech and language fluent Cranial nerve II-XII: Pupils were equal round reactive to light. Extraocular movements were full, visual field were full on confrontational test. Facial sensation and strength were normal. Head turning and shoulder shrug  were normal and symmetric. Motor: Good strength with exception, 3/5 weakness of left upper arm due to rotator cuff injury, lower extremities bilaterally 4/5.  Strong plantar/dorsiflexion bilaterally. Sensory: Decreased soft touch sensation to left  3-5 toes Coordination: Cerebellar testing reveals good finger-nose-finger and heel-to-shin bilaterally.  Gait and station: Gait is wide-based, cautious, can walk short distances independently, slight limp on the left side. Has rolling walker for distance.  Reflexes: Deep tendon reflexes are symmetric but decreased  DIAGNOSTIC DATA (LABS, IMAGING, TESTING) - I reviewed patient records, labs, notes, testing and imaging myself where available.  Lab Results  Component Value Date   WBC 10.3 02/05/2023   HGB 12.5 02/05/2023   HCT 38.6 02/05/2023   MCV 88.1 02/05/2023   PLT 249 02/05/2023      Component Value Date/Time   NA 135 02/05/2023 2010   NA 143 03/16/2017 1258   K 3.6 02/05/2023 2010   K 4.0 03/16/2017 1258   CL 98 02/05/2023 2010   CO2 25 02/05/2023 2010   CO2 26 03/16/2017 1258   GLUCOSE 122 (H) 02/05/2023 2010   GLUCOSE 118 03/16/2017 1258   BUN 18 02/05/2023 2010   BUN 13.5 03/16/2017 1258   CREATININE 0.93 02/05/2023 2010   CREATININE 0.76 04/11/2018 1258   CREATININE 0.8 03/16/2017 1258   CALCIUM 8.8 (L) 02/05/2023 2010   CALCIUM 9.9 03/16/2017 1258   PROT 7.3 02/05/2023 2010   PROT 7.3 03/16/2017 1258   ALBUMIN 3.7 02/05/2023 2010   ALBUMIN 3.5 03/16/2017 1258   AST 25 02/05/2023 2010   AST 26 04/11/2018 1258   AST 40 (H) 03/16/2017 1258   ALT 18 02/05/2023 2010   ALT 28 04/11/2018 1258   ALT 38 03/16/2017 1258   ALKPHOS 71 02/05/2023 2010   ALKPHOS 82 03/16/2017 1258   BILITOT 0.3 02/05/2023 2010   BILITOT 0.3 04/11/2018 1258   BILITOT 0.43 03/16/2017 1258   GFRNONAA >60 02/05/2023 2010   GFRNONAA >60 04/11/2018 1258   GFRAA >60 04/10/2019 1315   GFRAA >60 04/11/2018 1258   Lab Results  Component Value Date   CHOL 198 02/06/2023   HDL 55 02/06/2023   LDLCALC 96 02/06/2023   TRIG 233 (H) 02/06/2023   CHOLHDL 3.6 02/06/2023   Lab Results  Component Value Date   HGBA1C 6.9 (H) 02/06/2023   No results found for: "VITAMINB12" Lab Results   Component Value Date  TSH 2.895 02/05/2023    Margie Ege, AGNP-C, DNP 04/14/2023, 11:14 AM [facility] [address] [place], [place] [address] [phone]

## 2023-04-19 DIAGNOSIS — M47816 Spondylosis without myelopathy or radiculopathy, lumbar region: Secondary | ICD-10-CM | POA: Diagnosis not present

## 2023-04-19 DIAGNOSIS — M5136 Other intervertebral disc degeneration, lumbar region: Secondary | ICD-10-CM | POA: Diagnosis not present

## 2023-04-23 NOTE — Progress Notes (Signed)
I agree with the above plan 

## 2023-04-28 DIAGNOSIS — I10 Essential (primary) hypertension: Secondary | ICD-10-CM | POA: Diagnosis not present

## 2023-04-28 DIAGNOSIS — M8580 Other specified disorders of bone density and structure, unspecified site: Secondary | ICD-10-CM | POA: Diagnosis not present

## 2023-04-28 DIAGNOSIS — E782 Mixed hyperlipidemia: Secondary | ICD-10-CM | POA: Diagnosis not present

## 2023-04-28 DIAGNOSIS — M85832 Other specified disorders of bone density and structure, left forearm: Secondary | ICD-10-CM | POA: Diagnosis not present

## 2023-04-28 DIAGNOSIS — E1151 Type 2 diabetes mellitus with diabetic peripheral angiopathy without gangrene: Secondary | ICD-10-CM | POA: Diagnosis not present

## 2023-05-03 DIAGNOSIS — M47816 Spondylosis without myelopathy or radiculopathy, lumbar region: Secondary | ICD-10-CM | POA: Diagnosis not present

## 2023-06-05 IMAGING — MR MR LUMBAR SPINE W/O CM
4 of 5 series · 24 of 48 positions shown · non-contrast
Comparison: Lumbar spine radiographs 08/31/2018. CT abdomen and
pelvis with contrast 05/09/2015

CLINICAL DATA: Low back pain extending into the lower extremities
bilaterally for 2 months. Personal history of breast cancer.

EXAM:
MRI LUMBAR SPINE WITHOUT CONTRAST
TECHNIQUE: Multiplanar, multisequence MR imaging of the lumbar spine was
performed. No intravenous contrast was administered.

[Series 2: T2 · sagittal · 4.0mm · 0.53mm/px · 6 of 17 slices shown (1 of 2)]
[im 1/17]
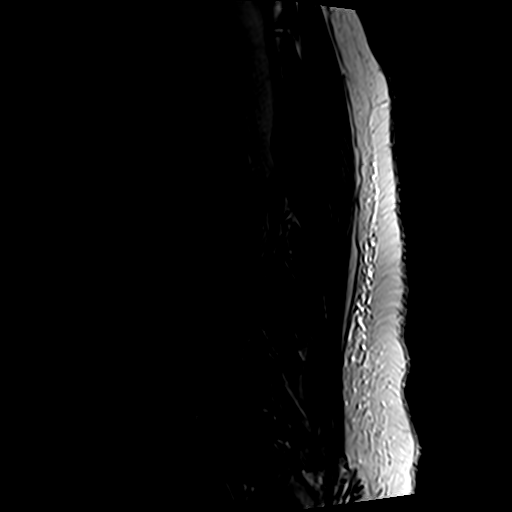
[im 4/17]
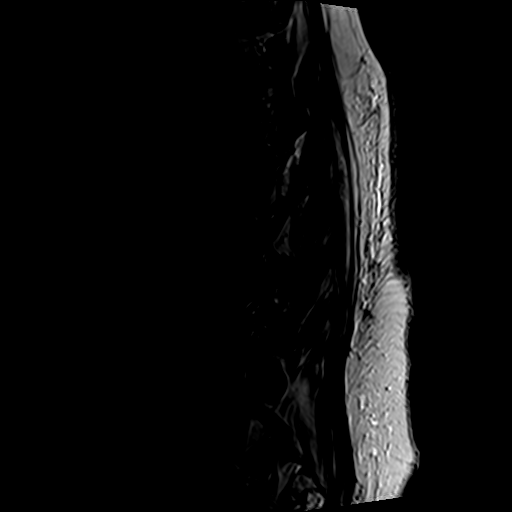
[im 7/17]
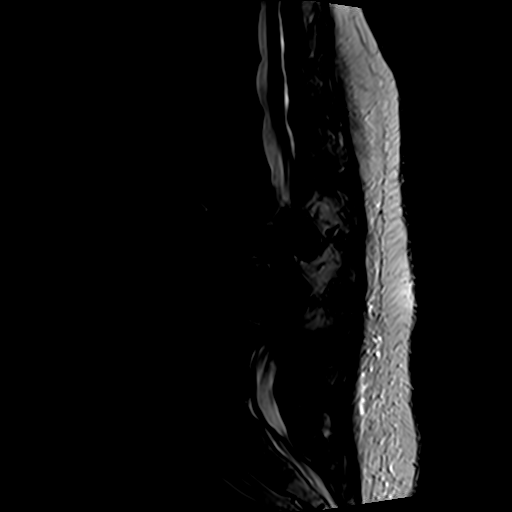
[im 10/17]
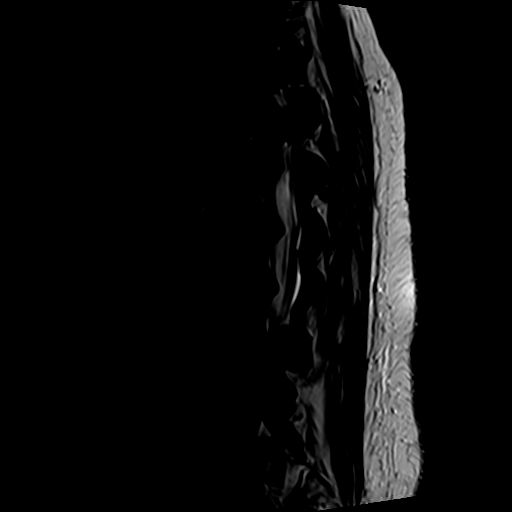
[im 13/17]
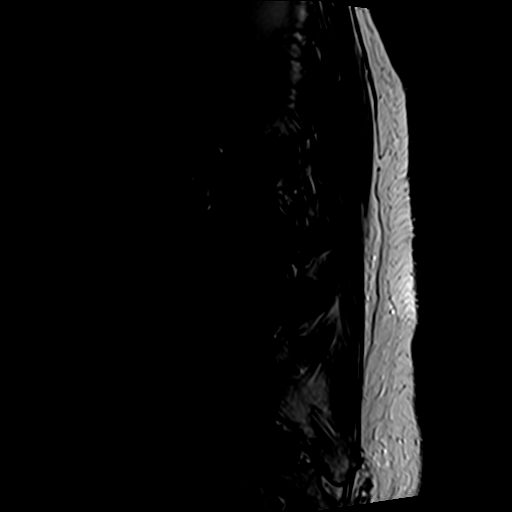
[im 17/17]
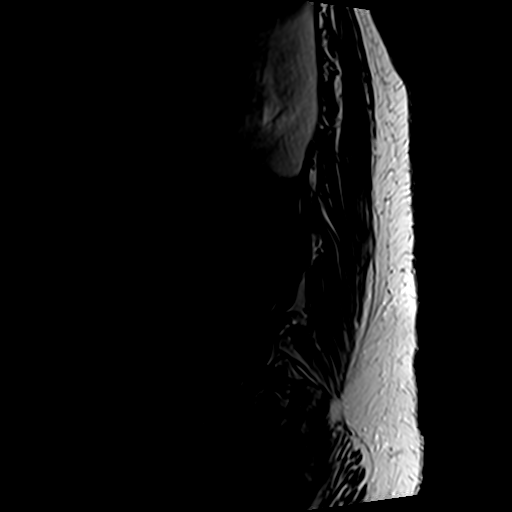

[Series 4: T1 · sagittal · 4.0mm · 0.53mm/px · 7 of 17 slices shown (1 of 2)]
[im 1/17]
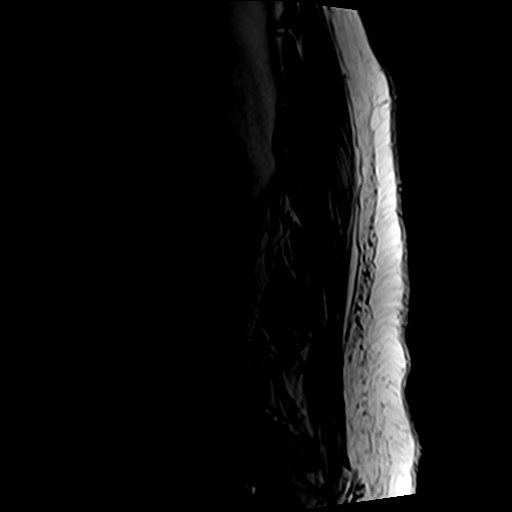
[im 3/17]
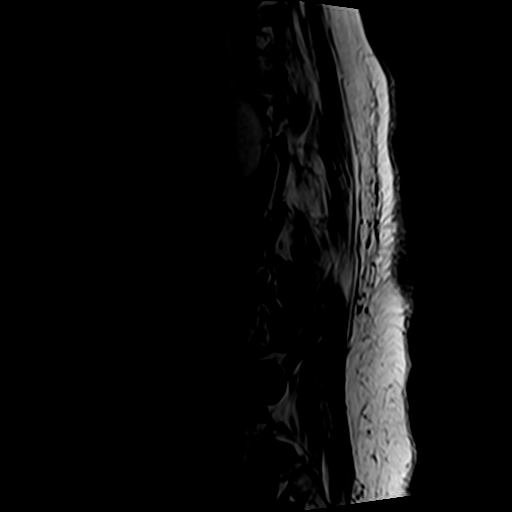
[im 6/17]
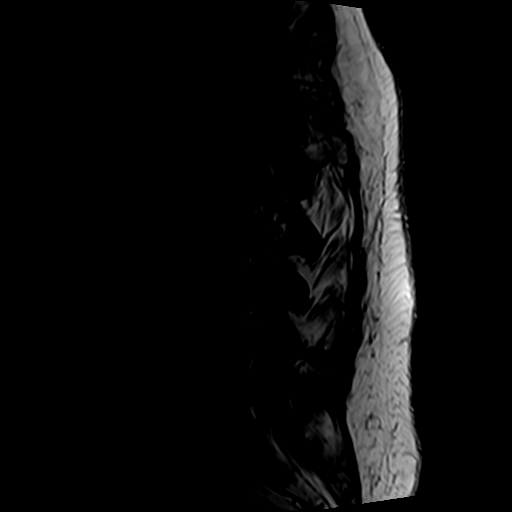
[im 9/17]
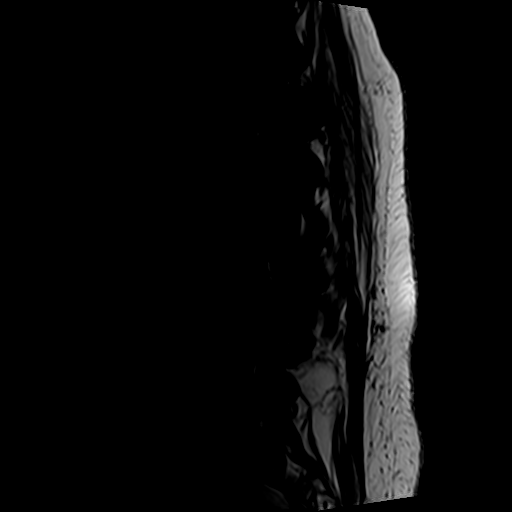
[im 11/17]
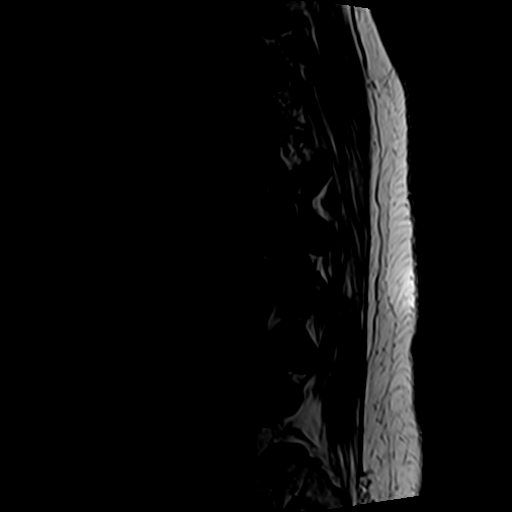
[im 14/17]
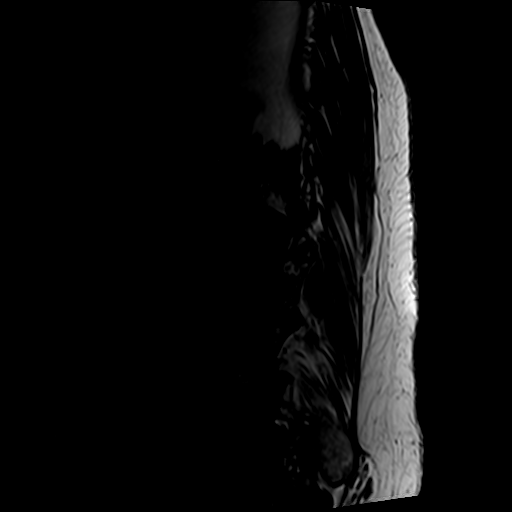
[im 17/17]
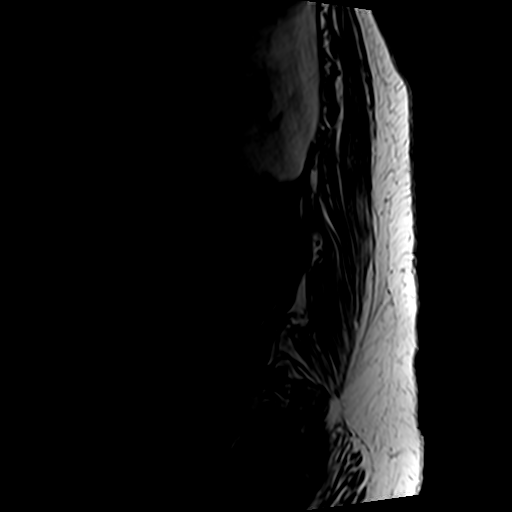

[Series 5: T2 · axial · 4.0mm · 0.70mm/px · z∈[-57,+121]mm · 8 of 33 slices shown (2 of 2)]
[im 1/33]
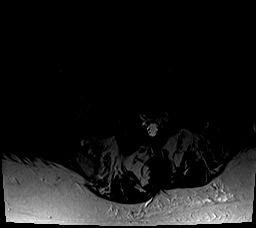
[im 5/33]
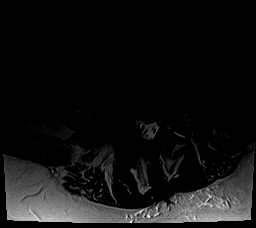
[im 10/33]
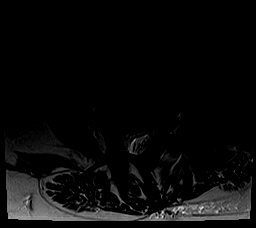
[im 15/33]
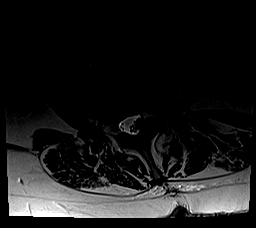
[im 18/33]
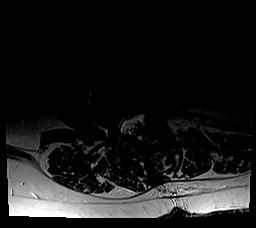
[im 23/33]
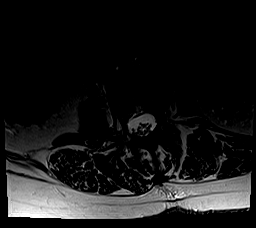
[im 28/33]
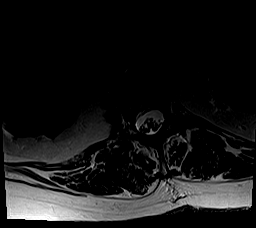
[im 33/33]
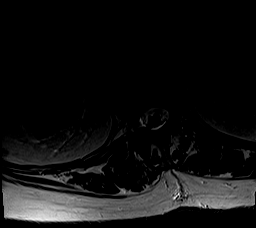

[Series 6: T1 · axial · 4.0mm · 0.35mm/px · z∈[-36,+95]mm · 3 of 33 slices shown (2 of 2)]
[im 5/33]
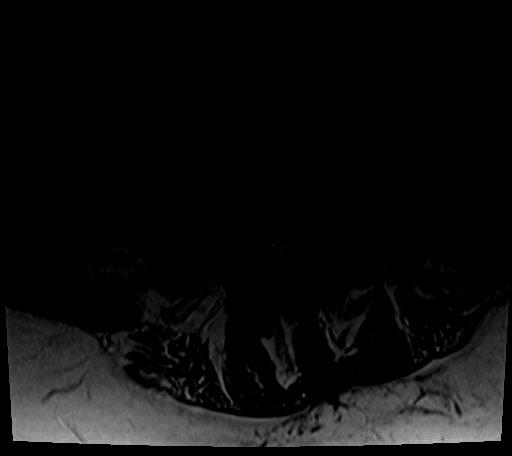
[im 18/33]
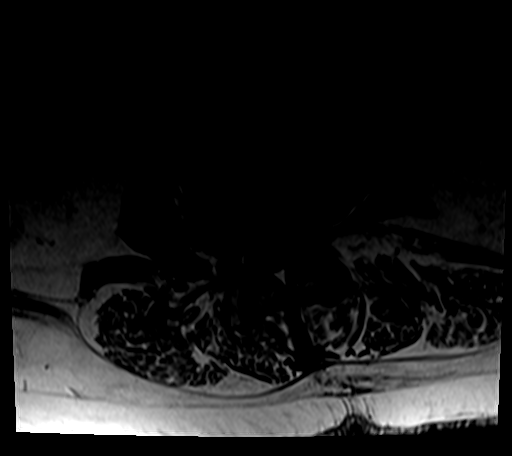
[im 28/33]
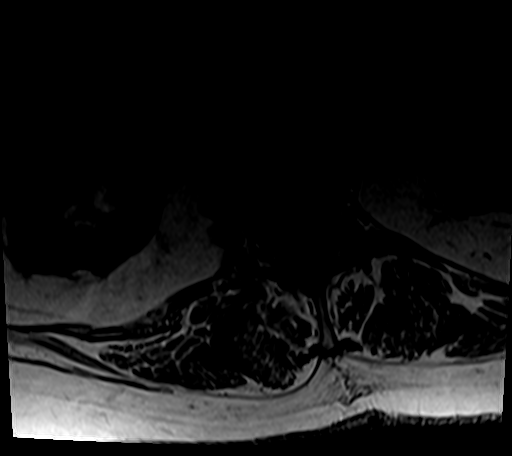

[24 of 48 positions shown; findings below may reference images not displayed]

FINDINGS: Segmentation: 5 non rib-bearing lumbar type vertebral bodies are
present. The lowest fully formed vertebral body is L5.

Alignment: Slight retrolisthesis at L4-5 and L5-S1 is stable.
Rightward curvature centered at L3 again noted.

Vertebrae: Next fatty and sclerotic endplate changes are present on
the left at L2-3 at L3-4 and on the right at L5-S1. Vertebral body
heights are maintained.

Conus medullaris and cauda equina: Conus extends to the L1 level.
Conus and cauda equina appear normal.

Paraspinal and other soft tissues: Exophytic cyst posteriorly in the
right kidney is partially imaged it has increased in size. A 9 mm
exophytic cyst is present at the lower pole of left kidney. Six and
8 mm indeterminate lesions are present in the right kidney.

Disc levels:

T12-L1: Moderate facet hypertrophy is present bilaterally without
significant stenosis.

L1-2: Moderate facet hypertrophy is present bilaterally. No
significant disc protrusion or stenosis is present.

L2-3: Chronic loss of disc height is present. Moderate facet
hypertrophy is noted bilaterally. Mild left subarticular narrowing
is present. Mild foraminal narrowing is evident bilaterally.

L3-4: Chronic loss of disc height is present. Mild facet hypertrophy
is worse on the left. Leftward disc protrusion noted. Mild left
subarticular and moderate left foraminal narrowing is present. The
right foramen is patent.

L4-5: A leftward disc protrusion is present. Moderate facet
hypertrophy is worse on the left. Moderate left and mild right
subarticular and foraminal narrowing is present.

L5-S1: Broad-based disc protrusion is asymmetric to the right. Mild
right-sided scratched at moderate right-sided facet hypertrophy is
noted. Mild subarticular narrowing is worse on the right. Moderate
foraminal narrowing is present bilaterally.
IMPRESSION: 1. Multilevel spondylosis of the lumbar spine as described.
2. Rightward scoliosis.
3. Multilevel facet hypertrophy contributes to the stenoses but may
be an independent source of pain as well.
4. Mild left subarticular and moderate left foraminal stenosis at
L3-4.
5. Moderate left and mild right subarticular and foraminal narrowing
at L4-5.
6. Mild subarticular and moderate foraminal narrowing bilaterally at
L5-S1.
7. Mild left subarticular and bilateral foraminal narrowing at L2-3.

## 2023-07-07 DIAGNOSIS — E1151 Type 2 diabetes mellitus with diabetic peripheral angiopathy without gangrene: Secondary | ICD-10-CM | POA: Diagnosis not present

## 2023-07-07 DIAGNOSIS — Z23 Encounter for immunization: Secondary | ICD-10-CM | POA: Diagnosis not present

## 2023-07-07 DIAGNOSIS — E782 Mixed hyperlipidemia: Secondary | ICD-10-CM | POA: Diagnosis not present

## 2023-07-07 DIAGNOSIS — I10 Essential (primary) hypertension: Secondary | ICD-10-CM | POA: Diagnosis not present

## 2023-07-07 DIAGNOSIS — E538 Deficiency of other specified B group vitamins: Secondary | ICD-10-CM | POA: Diagnosis not present

## 2023-07-07 DIAGNOSIS — Z8673 Personal history of transient ischemic attack (TIA), and cerebral infarction without residual deficits: Secondary | ICD-10-CM | POA: Diagnosis not present

## 2023-08-05 DIAGNOSIS — U071 COVID-19: Secondary | ICD-10-CM | POA: Diagnosis not present

## 2023-09-01 DIAGNOSIS — Z23 Encounter for immunization: Secondary | ICD-10-CM | POA: Diagnosis not present

## 2023-09-01 DIAGNOSIS — D72829 Elevated white blood cell count, unspecified: Secondary | ICD-10-CM | POA: Diagnosis not present

## 2023-09-01 DIAGNOSIS — R809 Proteinuria, unspecified: Secondary | ICD-10-CM | POA: Diagnosis not present

## 2023-09-01 DIAGNOSIS — Z9181 History of falling: Secondary | ICD-10-CM | POA: Diagnosis not present

## 2023-09-01 DIAGNOSIS — I10 Essential (primary) hypertension: Secondary | ICD-10-CM | POA: Diagnosis not present

## 2023-09-01 DIAGNOSIS — Z0000 Encounter for general adult medical examination without abnormal findings: Secondary | ICD-10-CM | POA: Diagnosis not present

## 2023-09-01 DIAGNOSIS — E782 Mixed hyperlipidemia: Secondary | ICD-10-CM | POA: Diagnosis not present

## 2023-09-01 DIAGNOSIS — E119 Type 2 diabetes mellitus without complications: Secondary | ICD-10-CM | POA: Diagnosis not present

## 2023-09-01 DIAGNOSIS — E1129 Type 2 diabetes mellitus with other diabetic kidney complication: Secondary | ICD-10-CM | POA: Diagnosis not present

## 2023-09-17 DIAGNOSIS — J988 Other specified respiratory disorders: Secondary | ICD-10-CM | POA: Diagnosis not present

## 2023-09-22 DIAGNOSIS — M10471 Other secondary gout, right ankle and foot: Secondary | ICD-10-CM | POA: Diagnosis not present

## 2023-09-22 DIAGNOSIS — J988 Other specified respiratory disorders: Secondary | ICD-10-CM | POA: Diagnosis not present

## 2023-10-14 DIAGNOSIS — R0602 Shortness of breath: Secondary | ICD-10-CM | POA: Diagnosis not present

## 2023-10-14 DIAGNOSIS — R053 Chronic cough: Secondary | ICD-10-CM | POA: Diagnosis not present

## 2023-11-29 DIAGNOSIS — H04123 Dry eye syndrome of bilateral lacrimal glands: Secondary | ICD-10-CM | POA: Diagnosis not present

## 2023-11-29 DIAGNOSIS — H4322 Crystalline deposits in vitreous body, left eye: Secondary | ICD-10-CM | POA: Diagnosis not present

## 2023-11-29 DIAGNOSIS — H25813 Combined forms of age-related cataract, bilateral: Secondary | ICD-10-CM | POA: Diagnosis not present

## 2023-11-29 DIAGNOSIS — E119 Type 2 diabetes mellitus without complications: Secondary | ICD-10-CM | POA: Diagnosis not present

## 2023-11-29 DIAGNOSIS — H43812 Vitreous degeneration, left eye: Secondary | ICD-10-CM | POA: Diagnosis not present

## 2023-12-21 ENCOUNTER — Other Ambulatory Visit: Payer: Self-pay | Admitting: Obstetrics and Gynecology

## 2023-12-21 DIAGNOSIS — Z1231 Encounter for screening mammogram for malignant neoplasm of breast: Secondary | ICD-10-CM

## 2023-12-28 ENCOUNTER — Ambulatory Visit
Admission: RE | Admit: 2023-12-28 | Discharge: 2023-12-28 | Disposition: A | Discharge status: Home / Self Care | Source: Ambulatory Visit | Attending: Obstetrics and Gynecology

## 2023-12-28 DIAGNOSIS — Z1231 Encounter for screening mammogram for malignant neoplasm of breast: Secondary | ICD-10-CM

## 2023-12-29 DIAGNOSIS — L900 Lichen sclerosus et atrophicus: Secondary | ICD-10-CM | POA: Diagnosis not present

## 2023-12-30 DIAGNOSIS — Z79899 Other long term (current) drug therapy: Secondary | ICD-10-CM | POA: Diagnosis not present

## 2023-12-30 DIAGNOSIS — M1990 Unspecified osteoarthritis, unspecified site: Secondary | ICD-10-CM | POA: Diagnosis not present

## 2023-12-30 DIAGNOSIS — E118 Type 2 diabetes mellitus with unspecified complications: Secondary | ICD-10-CM | POA: Diagnosis not present

## 2023-12-30 DIAGNOSIS — E782 Mixed hyperlipidemia: Secondary | ICD-10-CM | POA: Diagnosis not present

## 2023-12-30 DIAGNOSIS — E538 Deficiency of other specified B group vitamins: Secondary | ICD-10-CM | POA: Diagnosis not present

## 2023-12-30 DIAGNOSIS — I10 Essential (primary) hypertension: Secondary | ICD-10-CM | POA: Diagnosis not present

## 2023-12-30 DIAGNOSIS — Z8673 Personal history of transient ischemic attack (TIA), and cerebral infarction without residual deficits: Secondary | ICD-10-CM | POA: Diagnosis not present

## 2024-02-09 DIAGNOSIS — L900 Lichen sclerosus et atrophicus: Secondary | ICD-10-CM | POA: Diagnosis not present

## 2024-05-02 DIAGNOSIS — E1151 Type 2 diabetes mellitus with diabetic peripheral angiopathy without gangrene: Secondary | ICD-10-CM | POA: Diagnosis not present

## 2024-05-02 DIAGNOSIS — R5383 Other fatigue: Secondary | ICD-10-CM | POA: Diagnosis not present

## 2024-05-02 DIAGNOSIS — M62838 Other muscle spasm: Secondary | ICD-10-CM | POA: Diagnosis not present

## 2024-05-02 DIAGNOSIS — E538 Deficiency of other specified B group vitamins: Secondary | ICD-10-CM | POA: Diagnosis not present

## 2024-05-02 DIAGNOSIS — J45991 Cough variant asthma: Secondary | ICD-10-CM | POA: Diagnosis not present

## 2024-05-02 DIAGNOSIS — J3089 Other allergic rhinitis: Secondary | ICD-10-CM | POA: Diagnosis not present

## 2024-05-02 DIAGNOSIS — E782 Mixed hyperlipidemia: Secondary | ICD-10-CM | POA: Diagnosis not present

## 2024-05-02 DIAGNOSIS — Z8673 Personal history of transient ischemic attack (TIA), and cerebral infarction without residual deficits: Secondary | ICD-10-CM | POA: Diagnosis not present

## 2024-05-02 DIAGNOSIS — I10 Essential (primary) hypertension: Secondary | ICD-10-CM | POA: Diagnosis not present

## 2024-05-23 DIAGNOSIS — E878 Other disorders of electrolyte and fluid balance, not elsewhere classified: Secondary | ICD-10-CM | POA: Diagnosis not present

## 2024-06-25 DIAGNOSIS — Z8673 Personal history of transient ischemic attack (TIA), and cerebral infarction without residual deficits: Secondary | ICD-10-CM | POA: Diagnosis not present

## 2024-06-25 DIAGNOSIS — M7061 Trochanteric bursitis, right hip: Secondary | ICD-10-CM | POA: Diagnosis not present

## 2024-06-25 DIAGNOSIS — R110 Nausea: Secondary | ICD-10-CM | POA: Diagnosis not present

## 2024-07-06 DIAGNOSIS — E782 Mixed hyperlipidemia: Secondary | ICD-10-CM | POA: Diagnosis not present

## 2024-07-06 DIAGNOSIS — K219 Gastro-esophageal reflux disease without esophagitis: Secondary | ICD-10-CM | POA: Diagnosis not present

## 2024-07-06 DIAGNOSIS — I10 Essential (primary) hypertension: Secondary | ICD-10-CM | POA: Diagnosis not present

## 2024-07-06 DIAGNOSIS — M1990 Unspecified osteoarthritis, unspecified site: Secondary | ICD-10-CM | POA: Diagnosis not present

## 2024-07-06 DIAGNOSIS — R5383 Other fatigue: Secondary | ICD-10-CM | POA: Diagnosis not present

## 2024-07-06 DIAGNOSIS — Z23 Encounter for immunization: Secondary | ICD-10-CM | POA: Diagnosis not present

## 2024-07-06 DIAGNOSIS — E1151 Type 2 diabetes mellitus with diabetic peripheral angiopathy without gangrene: Secondary | ICD-10-CM | POA: Diagnosis not present

## 2024-07-06 DIAGNOSIS — R110 Nausea: Secondary | ICD-10-CM | POA: Diagnosis not present

## 2024-07-06 DIAGNOSIS — E538 Deficiency of other specified B group vitamins: Secondary | ICD-10-CM | POA: Diagnosis not present

## 2024-07-06 DIAGNOSIS — R1030 Lower abdominal pain, unspecified: Secondary | ICD-10-CM | POA: Diagnosis not present
